# Patient Record
Sex: Male | Born: 1960
Health system: Southern US, Community
[De-identification: ages and names within clinical notes are randomized; demographics above are authoritative.]

## PROBLEM LIST (undated history)

## (undated) DIAGNOSIS — R972 Elevated prostate specific antigen [PSA]: Secondary | ICD-10-CM

## (undated) DIAGNOSIS — M519 Unspecified thoracic, thoracolumbar and lumbosacral intervertebral disc disorder: Secondary | ICD-10-CM

## (undated) DIAGNOSIS — E785 Hyperlipidemia, unspecified: Secondary | ICD-10-CM

## (undated) DIAGNOSIS — K8020 Calculus of gallbladder without cholecystitis without obstruction: Secondary | ICD-10-CM

## (undated) DIAGNOSIS — J309 Allergic rhinitis, unspecified: Secondary | ICD-10-CM

## (undated) DIAGNOSIS — T7840XA Allergy, unspecified, initial encounter: Secondary | ICD-10-CM

## (undated) DIAGNOSIS — I10 Essential (primary) hypertension: Secondary | ICD-10-CM

## (undated) DIAGNOSIS — E119 Type 2 diabetes mellitus without complications: Secondary | ICD-10-CM

## (undated) HISTORY — DX: Type 2 diabetes mellitus without complications: E11.9

## (undated) HISTORY — DX: Allergy, unspecified, initial encounter: T78.40XA

## (undated) HISTORY — DX: Unspecified thoracic, thoracolumbar and lumbosacral intervertebral disc disorder: M51.9

## (undated) HISTORY — DX: Essential (primary) hypertension: I10

## (undated) HISTORY — PX: NASAL SEPTUM SURGERY: SHX37

## (undated) HISTORY — DX: Calculus of gallbladder without cholecystitis without obstruction: K80.20

## (undated) HISTORY — DX: Allergic rhinitis, unspecified: J30.9

## (undated) HISTORY — DX: Elevated prostate specific antigen (PSA): R97.20

## (undated) HISTORY — DX: Hyperlipidemia, unspecified: E78.5

---

## 1987-10-21 HISTORY — PX: BACK SURGERY: SHX140

## 2002-11-23 ENCOUNTER — Encounter: Admission: RE | Admit: 2002-11-23 | Discharge: 2003-02-21 | Payer: Self-pay | Admitting: Internal Medicine

## 2003-07-25 ENCOUNTER — Encounter: Admission: RE | Admit: 2003-07-25 | Discharge: 2003-10-23 | Payer: Self-pay | Admitting: Internal Medicine

## 2004-11-27 ENCOUNTER — Ambulatory Visit: Payer: Self-pay | Admitting: Internal Medicine

## 2005-03-27 ENCOUNTER — Ambulatory Visit: Payer: Self-pay | Admitting: Internal Medicine

## 2005-06-20 ENCOUNTER — Ambulatory Visit: Payer: Self-pay | Admitting: Internal Medicine

## 2005-06-27 ENCOUNTER — Ambulatory Visit: Payer: Self-pay | Admitting: Internal Medicine

## 2005-10-02 ENCOUNTER — Ambulatory Visit: Payer: Self-pay | Admitting: Internal Medicine

## 2005-12-25 ENCOUNTER — Ambulatory Visit: Payer: Self-pay | Admitting: Internal Medicine

## 2005-12-31 ENCOUNTER — Ambulatory Visit: Payer: Self-pay | Admitting: Internal Medicine

## 2006-05-04 ENCOUNTER — Ambulatory Visit: Payer: Self-pay | Admitting: Internal Medicine

## 2006-05-26 ENCOUNTER — Ambulatory Visit: Payer: Self-pay | Admitting: Internal Medicine

## 2006-06-25 ENCOUNTER — Ambulatory Visit: Payer: Self-pay | Admitting: Internal Medicine

## 2006-09-01 ENCOUNTER — Ambulatory Visit: Payer: Self-pay | Admitting: Internal Medicine

## 2006-11-26 ENCOUNTER — Ambulatory Visit: Payer: Self-pay | Admitting: Internal Medicine

## 2006-11-26 LAB — CONVERTED CEMR LAB
Alkaline Phosphatase: 49 units/L (ref 39–117)
BUN: 11 mg/dL (ref 6–23)
Bilirubin, Direct: 0.1 mg/dL (ref 0.0–0.3)
CO2: 30 meq/L (ref 19–32)
Calcium: 9.1 mg/dL (ref 8.4–10.5)
GFR calc Af Amer: 118 mL/min
GFR calc non Af Amer: 97 mL/min
Glucose, Bld: 121 mg/dL — ABNORMAL HIGH (ref 70–99)
Potassium: 4 meq/L (ref 3.5–5.1)
Total Protein: 6.7 g/dL (ref 6.0–8.3)

## 2006-12-04 ENCOUNTER — Ambulatory Visit: Payer: Self-pay | Admitting: Internal Medicine

## 2007-04-02 ENCOUNTER — Ambulatory Visit: Payer: Self-pay | Admitting: Internal Medicine

## 2007-04-02 LAB — CONVERTED CEMR LAB
CO2: 29 meq/L (ref 19–32)
Cholesterol: 205 mg/dL (ref 0–200)
Creatinine, Ser: 0.9 mg/dL (ref 0.4–1.5)
GFR calc Af Amer: 117 mL/min
Glucose, Bld: 134 mg/dL — ABNORMAL HIGH (ref 70–99)
HDL: 35.6 mg/dL — ABNORMAL LOW (ref >39.0)
Potassium: 4.3 meq/L (ref 3.5–5.1)
Sodium: 144 meq/L (ref 135–145)
Triglycerides: 81 mg/dL (ref 0–149)

## 2007-04-09 ENCOUNTER — Ambulatory Visit: Payer: Self-pay | Admitting: Internal Medicine

## 2007-05-24 ENCOUNTER — Encounter: Payer: Self-pay | Admitting: Internal Medicine

## 2007-05-24 DIAGNOSIS — E119 Type 2 diabetes mellitus without complications: Secondary | ICD-10-CM

## 2007-05-24 HISTORY — DX: Type 2 diabetes mellitus without complications: E11.9

## 2007-08-09 ENCOUNTER — Ambulatory Visit: Payer: Self-pay | Admitting: Internal Medicine

## 2007-08-09 LAB — CONVERTED CEMR LAB
Albumin: 3.8 g/dL (ref 3.5–5.2)
Alkaline Phosphatase: 57 units/L (ref 39–117)
BUN: 6 mg/dL (ref 6–23)
CO2: 32 meq/L (ref 19–32)
GFR calc Af Amer: 104 mL/min
GFR calc non Af Amer: 86 mL/min
LDL Cholesterol: 122 mg/dL — ABNORMAL HIGH (ref 0–99)
Potassium: 4.2 meq/L (ref 3.5–5.1)
Total CHOL/HDL Ratio: 5.2
Total Protein: 6.8 g/dL (ref 6.0–8.3)
Triglycerides: 135 mg/dL (ref 0–149)
VLDL: 27 mg/dL (ref 0–40)

## 2007-09-01 ENCOUNTER — Telehealth: Payer: Self-pay | Admitting: Internal Medicine

## 2007-09-11 ENCOUNTER — Telehealth: Payer: Self-pay | Admitting: Internal Medicine

## 2007-09-11 ENCOUNTER — Ambulatory Visit: Payer: Self-pay | Admitting: Internal Medicine

## 2007-09-11 DIAGNOSIS — M549 Dorsalgia, unspecified: Secondary | ICD-10-CM

## 2007-09-13 ENCOUNTER — Telehealth: Payer: Self-pay | Admitting: Internal Medicine

## 2007-10-06 ENCOUNTER — Ambulatory Visit: Payer: Self-pay | Admitting: Internal Medicine

## 2007-10-06 DIAGNOSIS — E785 Hyperlipidemia, unspecified: Secondary | ICD-10-CM

## 2007-10-06 HISTORY — DX: Hyperlipidemia, unspecified: E78.5

## 2007-10-06 LAB — CONVERTED CEMR LAB: Blood Glucose, Fingerstick: 178

## 2008-01-31 ENCOUNTER — Ambulatory Visit: Payer: Self-pay | Admitting: Internal Medicine

## 2008-01-31 LAB — CONVERTED CEMR LAB
BUN: 9 mg/dL (ref 6–23)
Cholesterol: 187 mg/dL (ref 0–200)
GFR calc Af Amer: 117 mL/min
Glucose, Bld: 176 mg/dL — ABNORMAL HIGH (ref 70–99)
Potassium: 4.1 meq/L (ref 3.5–5.1)
Triglycerides: 87 mg/dL (ref 0–149)
VLDL: 17 mg/dL (ref 0–40)

## 2008-02-03 ENCOUNTER — Ambulatory Visit: Payer: Self-pay | Admitting: Internal Medicine

## 2008-02-06 DIAGNOSIS — I10 Essential (primary) hypertension: Secondary | ICD-10-CM

## 2008-02-06 HISTORY — DX: Essential (primary) hypertension: I10

## 2008-03-24 ENCOUNTER — Telehealth (INDEPENDENT_AMBULATORY_CARE_PROVIDER_SITE_OTHER): Payer: Self-pay | Admitting: *Deleted

## 2008-05-08 ENCOUNTER — Encounter: Payer: Self-pay | Admitting: Internal Medicine

## 2008-07-24 ENCOUNTER — Ambulatory Visit: Payer: Self-pay | Admitting: Internal Medicine

## 2008-07-24 LAB — CONVERTED CEMR LAB
BUN: 7 mg/dL (ref 6–23)
Chloride: 105 meq/L (ref 96–112)
GFR calc Af Amer: 103 mL/min
Glucose, Bld: 117 mg/dL — ABNORMAL HIGH (ref 70–99)
Potassium: 4 meq/L (ref 3.5–5.1)

## 2008-08-17 ENCOUNTER — Ambulatory Visit: Payer: Self-pay | Admitting: Internal Medicine

## 2009-05-10 ENCOUNTER — Ambulatory Visit: Payer: Self-pay | Admitting: Internal Medicine

## 2009-05-10 DIAGNOSIS — R634 Abnormal weight loss: Secondary | ICD-10-CM

## 2009-05-11 ENCOUNTER — Encounter (INDEPENDENT_AMBULATORY_CARE_PROVIDER_SITE_OTHER): Payer: Self-pay | Admitting: *Deleted

## 2009-05-11 ENCOUNTER — Telehealth: Payer: Self-pay | Admitting: Internal Medicine

## 2009-05-11 ENCOUNTER — Encounter: Payer: Self-pay | Admitting: Internal Medicine

## 2009-05-11 DIAGNOSIS — R972 Elevated prostate specific antigen [PSA]: Secondary | ICD-10-CM

## 2009-05-11 HISTORY — DX: Elevated prostate specific antigen (PSA): R97.20

## 2009-05-18 ENCOUNTER — Telehealth: Payer: Self-pay | Admitting: Internal Medicine

## 2009-06-11 ENCOUNTER — Encounter: Payer: Self-pay | Admitting: Internal Medicine

## 2009-09-12 ENCOUNTER — Telehealth: Payer: Self-pay | Admitting: Internal Medicine

## 2010-03-04 ENCOUNTER — Telehealth: Payer: Self-pay | Admitting: Internal Medicine

## 2010-06-25 ENCOUNTER — Encounter: Payer: Self-pay | Admitting: Internal Medicine

## 2010-11-17 LAB — CONVERTED CEMR LAB
ALT: 23 units/L (ref 0–53)
Basophils Relative: 0.7 % (ref 0.0–3.0)
Bilirubin, Direct: 0.1 mg/dL (ref 0.0–0.3)
Chloride: 106 meq/L (ref 96–112)
Creatinine,U: 321.3 mg/dL
Eosinophils Relative: 1.5 % (ref 0.0–5.0)
HCT: 41.6 % (ref 39.0–52.0)
HDL goal, serum: 40 mg/dL
Hgb A1c MFr Bld: 6.5 % (ref 4.6–6.5)
LDL Goal: 100 mg/dL
Lymphs Abs: 1.2 10*3/uL (ref 0.7–4.0)
MCV: 84.7 fL (ref 78.0–100.0)
Monocytes Absolute: 0.5 10*3/uL (ref 0.1–1.0)
Nitrite: NEGATIVE
Potassium: 4.2 meq/L (ref 3.5–5.1)
RBC: 4.91 M/uL (ref 4.22–5.81)
Specific Gravity, Urine: >=1.03 (ref 1.000–1.030)
TSH: 1.07 microintl units/mL (ref 0.35–5.50)
Total Protein: 6.9 g/dL (ref 6.0–8.3)
Urine Glucose: NEGATIVE mg/dL
Urobilinogen, UA: 1 (ref 0.0–1.0)
WBC: 3.3 10*3/uL — ABNORMAL LOW (ref 4.5–10.5)

## 2010-11-21 NOTE — Letter (Signed)
Summary: [facility]   Imported By: Sherian Rein 07/02/2010 09:32:03  _____________________________________________________________________  External Attachment:    Type:   Image     Comment:   External Document

## 2010-11-21 NOTE — Progress Notes (Signed)
Summary: REFERRAL  Phone Note Call from Patient   Caller: Wife - [phone] OR [phone] Summary of Call: Pt works in [place] and has seen a Careers adviser for hernia surgery. Pt wants surgery here in [place].  Initial call taken by: Lamar Sprinkles, CMA,  Mar 04, 2010 11:22 AM  Follow-up for Phone Call        Advised wife to contact pt's MD in [facility] or surgeon and request referral to [facility] [phone]). Advised to call office back if they are unable to refer and will will request referral from Dr Plotnikov.  Follow-up by: Lamar Sprinkles, CMA,  Mar 04, 2010 11:27 AM

## 2011-02-18 HISTORY — PX: HERNIA REPAIR: SHX51

## 2011-03-13 ENCOUNTER — Ambulatory Visit (HOSPITAL_COMMUNITY)
Admission: RE | Admit: 2011-03-13 | Discharge: 2011-03-13 | Disposition: A | Discharge status: Home / Self Care | Payer: BC Managed Care – PPO | Source: Ambulatory Visit | Attending: Surgery | Admitting: Surgery

## 2011-03-13 ENCOUNTER — Encounter (HOSPITAL_COMMUNITY)
Admission: RE | Admit: 2011-03-13 | Discharge: 2011-03-13 | Disposition: A | Discharge status: Home / Self Care | Payer: BC Managed Care – PPO | Source: Ambulatory Visit | Attending: Surgery | Admitting: Surgery

## 2011-03-13 ENCOUNTER — Other Ambulatory Visit (INDEPENDENT_AMBULATORY_CARE_PROVIDER_SITE_OTHER): Payer: Self-pay | Admitting: Surgery

## 2011-03-13 DIAGNOSIS — K4090 Unilateral inguinal hernia, without obstruction or gangrene, not specified as recurrent: Secondary | ICD-10-CM

## 2011-03-13 DIAGNOSIS — Z01818 Encounter for other preprocedural examination: Secondary | ICD-10-CM | POA: Insufficient documentation

## 2011-03-13 DIAGNOSIS — Z01812 Encounter for preprocedural laboratory examination: Secondary | ICD-10-CM | POA: Insufficient documentation

## 2011-03-13 LAB — CBC
HCT: 41.9 % (ref 39.0–52.0)
MCV: 83.1 fL (ref 78.0–100.0)
RBC: 5.04 MIL/uL (ref 4.22–5.81)
RDW: 13.8 % (ref 11.5–15.5)
WBC: 3.5 10*3/uL — ABNORMAL LOW (ref 4.0–10.5)

## 2011-03-13 LAB — BASIC METABOLIC PANEL
BUN: 13 mg/dL (ref 6–23)
CO2: 29 mEq/L (ref 19–32)
Chloride: 103 mEq/L (ref 96–112)
Glucose, Bld: 98 mg/dL (ref 70–99)
Potassium: 4.1 mEq/L (ref 3.5–5.1)

## 2011-03-14 ENCOUNTER — Ambulatory Visit (HOSPITAL_COMMUNITY)
Admission: RE | Admit: 2011-03-14 | Discharge: 2011-03-14 | Disposition: A | Discharge status: Home / Self Care | Payer: BC Managed Care – PPO | Source: Ambulatory Visit | Attending: Surgery | Admitting: Surgery

## 2011-03-14 DIAGNOSIS — I10 Essential (primary) hypertension: Secondary | ICD-10-CM | POA: Insufficient documentation

## 2011-03-14 DIAGNOSIS — K4090 Unilateral inguinal hernia, without obstruction or gangrene, not specified as recurrent: Secondary | ICD-10-CM | POA: Insufficient documentation

## 2011-03-14 DIAGNOSIS — Z01818 Encounter for other preprocedural examination: Secondary | ICD-10-CM | POA: Insufficient documentation

## 2011-03-14 DIAGNOSIS — Z01810 Encounter for preprocedural cardiovascular examination: Secondary | ICD-10-CM | POA: Insufficient documentation

## 2011-03-14 DIAGNOSIS — E119 Type 2 diabetes mellitus without complications: Secondary | ICD-10-CM | POA: Insufficient documentation

## 2011-03-14 DIAGNOSIS — Z01812 Encounter for preprocedural laboratory examination: Secondary | ICD-10-CM | POA: Insufficient documentation

## 2011-03-14 LAB — GLUCOSE, CAPILLARY: Glucose-Capillary: 125 mg/dL — ABNORMAL HIGH (ref 70–99)

## 2011-04-03 ENCOUNTER — Encounter (INDEPENDENT_AMBULATORY_CARE_PROVIDER_SITE_OTHER): Payer: Self-pay | Admitting: Surgery

## 2011-04-03 NOTE — Op Note (Signed)
  NAME:  JERMOND, BURKEMPER NO.:  [id number]  MEDICAL RECORD NO.:  [id number]           PATIENT TYPE:  O  LOCATION:  [facility]                         FACILITY:  [facility]  PHYSICIAN:  Abigail Miyamoto, M.D. DATE OF BIRTH:  07-Mar-1961  DATE OF PROCEDURE:  03/14/2011 DATE OF DISCHARGE:  03/14/2011                              OPERATIVE REPORT   PREOPERATIVE DIAGNOSIS:  Left inguinal hernia.  POSTOPERATIVE DIAGNOSIS:  Left inguinal hernia.  PROCEDURE:  Left inguinal repair with mesh.  SURGEON:  Abigail Miyamoto, MD  ANESTHESIA:  General and 0.5% Marcaine and bupivacaine.  ESTIMATED BLOOD LOSS:  Minimal.  FINDINGS:  The patient was found to have a very small indirect and a moderate-sized direct inguinal hernia.  It was repaired with a piece of Parietex ProGrip Prolene mesh.  PROCEDURE IN DETAIL:  The patient was brought to the operating room and identified as Howard [name].  He was placed supine on the operating table and general anesthesia was induced.  His abdomen was then prepped and draped in the usual sterile fashion.  I anesthetized the skin with 0.5% Marcaine.  I then made an incision with a #15 blade in the patient's right groin.  I took this down through the Scarpa fascia with electrocautery.  The external oblique fascia was then identified and opened, then further through the external and internal rings with cautery.  The testicular and cord structures were then identified and controlled with Penrose drain.  The patient had a very small indirect hernia sac which I reduced from the cord structures.  I tied out the sac at the base with a 3-0 silk suture and excised the redundant sac.  The patient's palpable hernia was from a direct hernia defect.  This was easily reduced.  I then brought a piece of Parietex ProGrip mesh onto the field.  I placed it around the cord structures and laid in the inguinal floor appropriately.  I placed a single 2-0 Vicryl suture  at the pubis and placed one more medial laterally.  Good coverage of the inguinal floor appeared to be achieved.  I then closed the external oblique fascia over top of this with a running 2-0 Vicryl suture.  I anesthetized the skin further with bupivacaine and performed an ilioinguinal nerve block with the bupivacaine as well.  I then closed the Scarpa fascia with 3-0 Vicryl sutures and closed the skin with running 4-0 Monocryl.  Steri-Strips, Telfa, and Tegaderm were then applied.  The patient tolerated the procedure well.  All counts were correct at the end of the procedure.  The patient was then extubated in the operating room and taken in stable condition to the recovery room.     Abigail Miyamoto, M.D.     DB/MEDQ  D:  03/14/2011  T:  03/14/2011  Job:  [id number]  Electronically Signed by Abigail Miyamoto M.D. on 04/03/2011 07:05:04 AM

## 2012-01-15 ENCOUNTER — Other Ambulatory Visit (INDEPENDENT_AMBULATORY_CARE_PROVIDER_SITE_OTHER): Payer: 59

## 2012-01-15 ENCOUNTER — Ambulatory Visit (INDEPENDENT_AMBULATORY_CARE_PROVIDER_SITE_OTHER): Payer: 59 | Admitting: Internal Medicine

## 2012-01-15 ENCOUNTER — Encounter: Payer: Self-pay | Admitting: Internal Medicine

## 2012-01-15 VITALS — BP 120/84 | HR 64 | Temp 97.5°F | Ht 74.0 in | Wt 253.4 lb

## 2012-01-15 DIAGNOSIS — Z0000 Encounter for general adult medical examination without abnormal findings: Secondary | ICD-10-CM

## 2012-01-15 DIAGNOSIS — E785 Hyperlipidemia, unspecified: Secondary | ICD-10-CM

## 2012-01-15 DIAGNOSIS — E119 Type 2 diabetes mellitus without complications: Secondary | ICD-10-CM

## 2012-01-15 DIAGNOSIS — M519 Unspecified thoracic, thoracolumbar and lumbosacral intervertebral disc disorder: Secondary | ICD-10-CM | POA: Insufficient documentation

## 2012-01-15 DIAGNOSIS — Z0001 Encounter for general adult medical examination with abnormal findings: Secondary | ICD-10-CM | POA: Insufficient documentation

## 2012-01-15 DIAGNOSIS — I10 Essential (primary) hypertension: Secondary | ICD-10-CM

## 2012-01-15 DIAGNOSIS — J309 Allergic rhinitis, unspecified: Secondary | ICD-10-CM

## 2012-01-15 HISTORY — DX: Allergic rhinitis, unspecified: J30.9

## 2012-01-15 LAB — BASIC METABOLIC PANEL
BUN: 16 mg/dL (ref 6–23)
CO2: 26 mEq/L (ref 19–32)
Calcium: 9.5 mg/dL (ref 8.4–10.5)
GFR: 151.13 mL/min (ref >60.00)
Glucose, Bld: 114 mg/dL — ABNORMAL HIGH (ref 70–99)

## 2012-01-15 LAB — TSH: TSH: 1.44 u[IU]/mL (ref 0.35–5.50)

## 2012-01-15 LAB — CBC WITH DIFFERENTIAL/PLATELET
Basophils Absolute: 0 10*3/uL (ref 0.0–0.1)
Eosinophils Relative: 2.1 % (ref 0.0–5.0)
HCT: 44 % (ref 39.0–52.0)
Lymphocytes Relative: 38.8 % (ref 12.0–46.0)
Monocytes Relative: 13.6 % — ABNORMAL HIGH (ref 3.0–12.0)
Neutrophils Relative %: 44.2 % (ref 43.0–77.0)
Platelets: 176 10*3/uL (ref 150.0–400.0)
WBC: 3 10*3/uL — ABNORMAL LOW (ref 4.5–10.5)

## 2012-01-15 LAB — URINALYSIS, ROUTINE W REFLEX MICROSCOPIC
Bilirubin Urine: NEGATIVE
Nitrite: NEGATIVE
Specific Gravity, Urine: >=1.03 (ref 1.000–1.030)
Total Protein, Urine: NEGATIVE
pH: 6 (ref 5.0–8.0)

## 2012-01-15 LAB — HEPATIC FUNCTION PANEL
ALT: 23 U/L (ref 0–53)
AST: 24 U/L (ref 0–37)
Alkaline Phosphatase: 63 U/L (ref 39–117)
Bilirubin, Direct: 0.1 mg/dL (ref 0.0–0.3)
Total Bilirubin: 0.5 mg/dL (ref 0.3–1.2)

## 2012-01-15 LAB — MICROALBUMIN / CREATININE URINE RATIO
Microalb Creat Ratio: 0.3 mg/g (ref 0.0–30.0)
Microalb, Ur: 0.9 mg/dL (ref 0.0–1.9)

## 2012-01-15 LAB — HEMOGLOBIN A1C: Hgb A1c MFr Bld: 6.8 % — ABNORMAL HIGH (ref 4.6–6.5)

## 2012-01-15 LAB — LIPID PANEL: Total CHOL/HDL Ratio: 4

## 2012-01-15 MED ORDER — LANCETS MISC: 1.0000 "application " | Freq: Every day | Status: DC

## 2012-01-15 MED ORDER — LISINOPRIL 5 MG PO TABS: 5.0000 mg | ORAL_TABLET | Freq: Every day | ORAL | Status: DC

## 2012-01-15 MED ORDER — GLUCOSE BLOOD VI STRP: ORAL_STRIP | Status: DC

## 2012-01-15 MED ORDER — FEXOFENADINE HCL 180 MG PO TABS: 180.0000 mg | ORAL_TABLET | Freq: Every day | ORAL | Status: DC

## 2012-01-15 MED ORDER — METFORMIN HCL ER 500 MG PO TB24: 1000.0000 mg | ORAL_TABLET | Freq: Every day | ORAL | Status: DC

## 2012-01-15 MED ORDER — ASPIRIN 81 MG PO TBEC: 81.0000 mg | DELAYED_RELEASE_TABLET | Freq: Every day | ORAL | Status: AC

## 2012-01-15 MED ORDER — PRAVASTATIN SODIUM 20 MG PO TABS: 20.0000 mg | ORAL_TABLET | Freq: Every day | ORAL | Status: DC

## 2012-01-15 NOTE — Assessment & Plan Note (Signed)
Ok to take the whole metformin in the AM to help compliacne, to check labs, gave glucometer and supplies , for cont'd diet, exercise, wt loss

## 2012-01-15 NOTE — Assessment & Plan Note (Signed)

## 2012-01-15 NOTE — Patient Instructions (Addendum)
Take all new medications as prescribed, including the Aspirin 81 mg - 1 per day - COATED only Continue all other medications as before Please call if you need the flonase nasal spray prescription for nasal congestion if the allegra is not good enough You are given the glucometer today, and the prescription for the supplies Please check your sugar once daily Please go to LAB in the Basement for the blood and/or urine tests to be done today You will be contacted by phone if any changes need to be made immediately.  Otherwise, you will receive a letter about your results with an explanation. You are otherwise up to date with prevention Please return in 6 mo with Lab testing done 3-5 days before

## 2012-01-15 NOTE — Assessment & Plan Note (Signed)
Ok to re-start the pravachol 20

## 2012-01-15 NOTE — Progress Notes (Signed)
Subjective:    Patient ID: Howard [name], male    DOB: 1961/03/16, 51 y.o.   MRN: [id number]  HPI  Here for wellness and to re-stablish after working in [place] for about 3 yrs;;  Overall doing ok;  Pt denies CP, worsening SOB, DOE, wheezing, orthopnea, PND, worsening LE edema, palpitations, dizziness or syncope.  Pt denies neurological change such as new Headache, facial or extremity weakness.  Pt denies polydipsia, polyuria, or low sugar symptoms. Pt states overall good compliance with treatment and medications, good tolerability, and trying to follow lower cholesterol diet.  Pt denies worsening depressive symptoms, suicidal ideation or panic. No fever, wt loss, night sweats, loss of appetite, or other constitutional symptoms.  Pt states good ability with ADL's, low fall risk, home safety reviewed and adequate, no significant changes in hearing or vision, and occasionally active with exercise. Last a1c about 6.7 in 2012.   Was on metformin Er 500 bid, but often missed the second dose per day.  Also was on lisinopril 10 mg and pravastatin 20 at night but ran out of these meds dec 2012.   Not taking ASA.  Does have signficant nasal allergy symptoms since moving back to [place], works [employer] representative.  Past Medical History  Diagnosis Date  . Diabetes mellitus   . PSA, INCREASED 05/11/2009    Qualifier: Diagnosis of  By: Yetta Barre MD, Bernadene Bell.   . HYPERTENSION 02/06/2008    Qualifier: Diagnosis of  By: Posey Rea MD, Georgina Quint   . HYPERLIPIDEMIA 10/06/2007    Qualifier: Diagnosis of  By: Posey Rea MD, Georgina Quint   . DIABETES MELLITUS, TYPE II 05/24/2007    Qualifier: Diagnosis of  By: Dance CMA (AAMA), Kim    . Lumbar disc disease     s/p surgury approx 1992   Past Surgical History  Procedure Date  . Back surgery     Lumbar/Ruptured Disk  . Hernia repair may 2012    left inguinal    reports that he has never smoked. He has never used smokeless tobacco. He reports that he drinks alcohol. He reports that he  does not use illicit drugs. family history includes Cancer in his mother; Diabetes in his father and mother; and Hypertension in his father and mother. No Known Allergies Current Outpatient Prescriptions on File Prior to Visit  Medication Sig Dispense Refill  . metFORMIN (GLUMETZA) 1000 MG (MOD) 24 hr tablet Take 1,000 mg by mouth 2 (two) times daily with a meal.         Review of Systems Review of Systems  Constitutional: Negative for diaphoresis, activity change, appetite change and unexpected weight change.  HENT: Negative for hearing loss, ear pain, facial swelling, mouth sores and neck stiffness.   Eyes: Negative for pain, redness and visual disturbance.  Respiratory: Negative for shortness of breath and wheezing.   Cardiovascular: Negative for chest pain and palpitations.  Gastrointestinal: Negative for diarrhea, blood in stool, abdominal distention and rectal pain.  Genitourinary: Negative for hematuria, flank pain and decreased urine volume.  Musculoskeletal: Negative for myalgias and joint swelling.  Skin: Negative for color change and wound.  Neurological: Negative for syncope and numbness.  Hematological: Negative for adenopathy.  Psychiatric/Behavioral: Negative for hallucinations, self-injury, decreased concentration and agitation.      Objective:   Physical Exam BP 120/84  Pulse 64  Temp(Src) 97.5 F (36.4 C) (Oral)  Ht 6\' 2"  (1.88 m)  Wt 253 lb 6 oz (114.93 kg)  BMI 32.53 kg/m2  SpO2  96% Physical Exam  VS noted Constitutional: Pt is oriented to person, place, and time. Appears well-developed and well-nourished.  HENT:  Head: Normocephalic and atraumatic.  Right Ear: External ear normal.  Left Ear: External ear normal.  Nose: Nose normal.  Mouth/Throat: Oropharynx is clear and moist.  Bilat tm's mild erythema.  Sinus nontender.  Pharynx mild erythema Eyes: Conjunctivae and EOM are normal. Pupils are equal, round, and reactive to light.  Neck: Normal range of  motion. Neck supple. No JVD present. No tracheal deviation present.  Cardiovascular: Normal rate, regular rhythm, normal heart sounds and intact distal pulses.   Pulmonary/Chest: Effort normal and breath sounds normal.  Abdominal: Soft. Bowel sounds are normal. There is no tenderness.  Musculoskeletal: Normal range of motion. Exhibits no edema.  Lymphadenopathy:  Has no cervical adenopathy.  Neurological: Pt is alert and oriented to person, place, and time. Pt has normal reflexes. No cranial nerve deficit.  Skin: Skin is warm and dry. No rash noted.  Psychiatric:  Has  normal mood and affect. Behavior is normal.     Assessment & Plan:

## 2012-01-15 NOTE — Assessment & Plan Note (Signed)
Ok for [employer] prn, consider flonase if congestion worse

## 2012-01-15 NOTE — Assessment & Plan Note (Signed)
Ok for low dose ACE;  Lisinopril 5

## 2012-02-02 ENCOUNTER — Other Ambulatory Visit: Payer: Self-pay

## 2012-02-02 DIAGNOSIS — E119 Type 2 diabetes mellitus without complications: Secondary | ICD-10-CM

## 2012-02-02 MED ORDER — GLUCOSE BLOOD VI STRP: ORAL_STRIP | Status: DC

## 2012-04-02 ENCOUNTER — Telehealth: Payer: Self-pay | Admitting: Internal Medicine

## 2012-04-02 NOTE — Telephone Encounter (Signed)
Caller: Trevaughn/Patient; PCP: Oliver Barre; CB#: [phone]; ; Call regarding (R) Knee Pain; onset ~ 1 week of swelling, denies injury, walks with a limp, unable to straighten his knee, using Ibuprofen for the pain, calling for appt. Smptoms reviewed Knee Non-Injury,  Disp: See within 4 hrs: due to marked swelling,   no appt available in [facility], please call pt re: appt. Pt asks is he can have referral to ortho. Please call.

## 2012-04-07 NOTE — Telephone Encounter (Signed)
Can see this afternoon or in the am per pt preference

## 2012-04-07 NOTE — Telephone Encounter (Signed)
Called the patient left message to call back 

## 2012-04-07 NOTE — Telephone Encounter (Signed)
Called left message to call back 

## 2012-04-07 NOTE — Telephone Encounter (Signed)
Note was routed to several different users not to PCP or CMA.

## 2012-04-07 NOTE — Telephone Encounter (Signed)
Patient called back informed ok to be seen.  Patient scheduled with Dr. Jonny Ruiz on 04/08/12.

## 2012-04-08 ENCOUNTER — Ambulatory Visit (INDEPENDENT_AMBULATORY_CARE_PROVIDER_SITE_OTHER): Payer: 59 | Admitting: Internal Medicine

## 2012-04-08 ENCOUNTER — Encounter: Payer: Self-pay | Admitting: Internal Medicine

## 2012-04-08 VITALS — BP 122/80 | HR 78 | Temp 97.3°F | Ht 74.0 in | Wt 262.0 lb

## 2012-04-08 DIAGNOSIS — M25561 Pain in right knee: Secondary | ICD-10-CM | POA: Insufficient documentation

## 2012-04-08 DIAGNOSIS — E119 Type 2 diabetes mellitus without complications: Secondary | ICD-10-CM

## 2012-04-08 DIAGNOSIS — I10 Essential (primary) hypertension: Secondary | ICD-10-CM

## 2012-04-08 DIAGNOSIS — M25569 Pain in unspecified knee: Secondary | ICD-10-CM

## 2012-04-08 MED ORDER — TRAMADOL HCL 50 MG PO TABS: 50.0000 mg | ORAL_TABLET | Freq: Four times a day (QID) | ORAL | Status: AC | PRN

## 2012-04-08 MED ORDER — NAPROXEN 500 MG PO TABS: 500.0000 mg | ORAL_TABLET | Freq: Two times a day (BID) | ORAL | Status: DC

## 2012-04-08 NOTE — Assessment & Plan Note (Signed)
stable overall by hx and exam, most recent data reviewed with pt, and pt to continue medical treatment as before Lab Results  Component Value Date   HGBA1C 6.8* 01/15/2012

## 2012-04-08 NOTE — Patient Instructions (Addendum)
Take all new medications as prescribed Continue all other medications as before You will be contacted regarding the referral for: orthopedic 

## 2012-04-08 NOTE — Progress Notes (Signed)
Subjective:    Patient ID: Howard [name], male    DOB: Oct 02, 1961, 51 y.o.   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  Pt denies polydipsia, polyuria, Pt denies chest pain, increased sob or doe, wheezing, orthopnea, PND, increased LE swelling, palpitations, dizziness or syncope.  Pt denies new neurological symptoms such as new headache, or facial or extremity weakness or numbness     Past Medical History  Diagnosis Date  . Diabetes mellitus   . PSA, INCREASED 05/11/2009    Qualifier: Diagnosis of  By: Yetta Barre MD, Bernadene Bell.   . HYPERTENSION 02/06/2008    Qualifier: Diagnosis of  By: Posey Rea MD, Georgina Quint   . HYPERLIPIDEMIA 10/06/2007    Qualifier: Diagnosis of  By: Posey Rea MD, Georgina Quint   . DIABETES MELLITUS, TYPE II 05/24/2007    Qualifier: Diagnosis of  By: Dance CMA (AAMA), Kim    . Lumbar disc disease     s/p surgury approx 1992  . Allergic rhinitis, cause unspecified 01/15/2012   Past Surgical History  Procedure Date  . Back surgery     Lumbar/Ruptured Disk  . Hernia repair may 2012    left inguinal    reports that he has never smoked. He has never used smokeless tobacco. He reports that he drinks alcohol. He reports that he does not use illicit drugs. family history includes Cancer in his mother; Diabetes in his father and mother; and Hypertension in his father and mother. No Known Allergies Current Outpatient Prescriptions on File Prior to Visit  Medication Sig Dispense Refill  . aspirin 81 MG EC tablet Take 1 tablet (81 mg total) by mouth daily. Swallow whole.  30 tablet  12  . fexofenadine (ALLEGRA) 180  MG tablet Take 1 tablet (180 mg total) by mouth daily.  90 tablet  3  . glucose blood (ONE TOUCH ULTRA TEST) test strip Use as instructed once daily. Diagnosis 250.0  100 each  12  . Lancets MISC 1 application by Does not apply route daily.  100 each  12  . lisinopril (PRINIVIL,ZESTRIL) 5 MG tablet Take 1 tablet (5 mg total) by mouth daily.  90 tablet  3  . metFORMIN (GLUCOPHAGE-XR) 500 MG 24 hr tablet Take 2 tablets (1,000 mg total) by mouth daily with breakfast.  180 tablet  3  . pravastatin (PRAVACHOL) 20 MG tablet Take 1 tablet (20 mg total) by mouth daily.  90 tablet  3   Review of Systems Review of Systems  Constitutional: Negative for diaphoresis and unexpected weight change.   Eyes: Negative for photophobia and visual disturbance.  Respiratory: Negative for choking and stridor.   Gastrointestinal: Negative for vomiting and blood in stool.  Genitourinary: Negative for hematuria and decreased urine volume.  Skin: Negative for color change and wound.  Neurological: Negative for tremors and numbness.  Psychiatric/Behavioral: Negative for decreased concentration. The patient is not hyperactive.      Objective:   Physical Exam BP 122/80  Pulse 78  Temp 97.3 F (36.3 C) (Oral)  Ht 6\' 2"  (1.88 m)  Wt 262 lb (  118.842 kg)  BMI 33.64 kg/m2  SpO2 96% Physical Exam  VS noted Constitutional: Pt appears well-developed and well-nourished.  HENT: Head: Normocephalic.  Right Ear: External ear normal.  Left Ear: External ear normal.  Eyes: Conjunctivae and EOM are normal. Pupils are equal, round, and reactive to light.  Neck: Normal range of motion. Neck supple.  Cardiovascular: Normal rate and regular rhythm.   Pulmonary/Chest: Effort normal and breath sounds normal.  Right knee with medial bony deg change and mild swelling , nontender to palpate, FROM, small crepitus noted Neurological: Pt is alert. LE motor/gait intact Skin: Skin is warm. No erythema. No rash Psychiatric: Pt behavior  is normal. Thought content normal. Not nervous    Assessment & Plan:

## 2012-04-08 NOTE — Assessment & Plan Note (Signed)
C/w prob OA flare, d/w pt  - for nsaid and pain control, but liekly to recur given his job, for ortho referral as well

## 2012-04-08 NOTE — Assessment & Plan Note (Signed)
stable overall by hx and exam, most recent data reviewed with pt, and pt to continue medical treatment as before BP Readings from Last 3 Encounters:  04/08/12 122/80  01/15/12 120/84  05/10/09 140/82

## 2012-07-19 ENCOUNTER — Encounter: Payer: Self-pay | Admitting: Internal Medicine

## 2012-07-19 ENCOUNTER — Other Ambulatory Visit (INDEPENDENT_AMBULATORY_CARE_PROVIDER_SITE_OTHER): Payer: 59

## 2012-07-19 ENCOUNTER — Ambulatory Visit (INDEPENDENT_AMBULATORY_CARE_PROVIDER_SITE_OTHER): Payer: 59 | Admitting: Internal Medicine

## 2012-07-19 VITALS — BP 122/80 | HR 82 | Temp 97.6°F | Ht 74.0 in | Wt 267.4 lb

## 2012-07-19 DIAGNOSIS — J309 Allergic rhinitis, unspecified: Secondary | ICD-10-CM

## 2012-07-19 DIAGNOSIS — E119 Type 2 diabetes mellitus without complications: Secondary | ICD-10-CM

## 2012-07-19 DIAGNOSIS — E785 Hyperlipidemia, unspecified: Secondary | ICD-10-CM

## 2012-07-19 DIAGNOSIS — I10 Essential (primary) hypertension: Secondary | ICD-10-CM

## 2012-07-19 DIAGNOSIS — Z0000 Encounter for general adult medical examination without abnormal findings: Secondary | ICD-10-CM

## 2012-07-19 LAB — LIPID PANEL
Cholesterol: 174 mg/dL (ref 0–200)
HDL: 50.3 mg/dL (ref >39.00)
LDL Cholesterol: 106 mg/dL — ABNORMAL HIGH (ref 0–99)
Total CHOL/HDL Ratio: 3
Triglycerides: 88 mg/dL (ref 0.0–149.0)
VLDL: 17.6 mg/dL (ref 0.0–40.0)

## 2012-07-19 LAB — BASIC METABOLIC PANEL
BUN: 11 mg/dL (ref 6–23)
Calcium: 9.2 mg/dL (ref 8.4–10.5)
Chloride: 103 mEq/L (ref 96–112)
Creatinine, Ser: 0.9 mg/dL (ref 0.4–1.5)

## 2012-07-19 MED ORDER — PRAVASTATIN SODIUM 40 MG PO TABS: 40.0000 mg | ORAL_TABLET | Freq: Every day | ORAL | Status: DC

## 2012-07-19 MED ORDER — LEVOCETIRIZINE DIHYDROCHLORIDE 5 MG PO TABS: 5.0000 mg | ORAL_TABLET | Freq: Every day | ORAL | Status: DC | PRN

## 2012-07-19 NOTE — Assessment & Plan Note (Signed)
With large recent wt gain, for a1c today o/w stable overall by hx and exam, most recent data reviewed with pt, and pt to continue medical treatment as before Lab Results  Component Value Date   HGBA1C 6.8* 01/15/2012

## 2012-07-19 NOTE — Assessment & Plan Note (Signed)
stable overall by hx and exam, most recent data reviewed with pt, and pt to continue medical treatment as before BP Readings from Last 3 Encounters:  07/19/12 122/80  04/08/12 122/80  01/15/12 120/84

## 2012-07-19 NOTE — Progress Notes (Signed)
Subjective:    Patient ID: Howard [name], male    DOB: December 21, 1960, 50 y.o.   MRN: [id number]  HPI  Here to f/u; overall doing ok,  Pt denies chest pain, increased sob or doe, wheezing, orthopnea, PND, increased LE swelling, palpitations, dizziness or syncope.  Pt denies new neurological symptoms such as new headache, or facial or extremity weakness or numbness   Pt denies polydipsia, polyuria, or low sugar symptoms such as weakness or confusion improved with po intake.  Pt states overall good compliance with meds, trying to follow lower cholesterol, diabetic diet, wt overall stable but little exercise however.  Has seen Dr Irving Copas for right pain and swelling, not better with one cortisone shot, MRI showed meniscal and cartilage tears, for surgury later this month.  HAs gaine 14 ls due to being less active.  Does have several wks ongoing nasal allergy symptoms with clear congestion, itch and sneeze, without fever, pain, ST, cough or wheezing., but allegra too expensive Past Medical History  Diagnosis Date  . Diabetes mellitus   . PSA, INCREASED 05/11/2009    Qualifier: Diagnosis of  By: Yetta Barre MD, Bernadene Bell.   . HYPERTENSION 02/06/2008    Qualifier: Diagnosis of  By: Posey Rea MD, Georgina Quint   . HYPERLIPIDEMIA 10/06/2007    Qualifier: Diagnosis of  By: Posey Rea MD, Georgina Quint   . DIABETES MELLITUS, TYPE II 05/24/2007    Qualifier: Diagnosis of  By: Dance CMA (AAMA), Kim    . Lumbar disc disease     s/p surgury approx 1992  . Allergic rhinitis, cause unspecified 01/15/2012   Past Surgical History  Procedure Date  . Back surgery     Lumbar/Ruptured Disk  . Hernia repair may 2012    left inguinal    reports that he has never smoked. He has never used smokeless tobacco. He reports that he drinks alcohol. He reports that he does not use illicit drugs. family history includes Cancer in his mother; Diabetes in his father and mother; and Hypertension in his father and mother. No Known  Allergies Current Outpatient Prescriptions on File Prior to Visit  Medication Sig Dispense Refill  . aspirin 81 MG EC tablet Take 1 tablet (81 mg total) by mouth daily. Swallow whole.  30 tablet  12  . fexofenadine (ALLEGRA) 180 MG tablet Take 1 tablet (180 mg total) by mouth daily.  90 tablet  3  . glucose blood (ONE TOUCH ULTRA TEST) test strip Use as instructed once daily. Diagnosis 250.0  100 each  12  . Lancets MISC 1 application by Does not apply route daily.  100 each  12  . lisinopril (PRINIVIL,ZESTRIL) 5 MG tablet Take 1 tablet (5 mg total) by mouth daily.  90 tablet  3  . metFORMIN (GLUCOPHAGE-XR) 500 MG 24 hr tablet Take 2 tablets (1,000 mg total) by mouth daily with breakfast.  180 tablet  3  . naproxen (NAPROSYN) 500 MG tablet Take 1 tablet (500 mg total) by mouth 2 (two) times daily with a meal.  60 tablet  5   Review of Systems  Constitutional: Negative for diaphoresis and unexpected weight change.  HENT: Negative for tinnitus.   Eyes: Negative for photophobia and visual disturbance.  Respiratory: Negative for choking and stridor.   Gastrointestinal: Negative for vomiting and blood in stool.  Genitourinary: Negative for hematuria and decreased urine volume.  Musculoskeletal: Negative for gait problem.  Skin: Negative for color change and wound.  Neurological: Negative for tremors and  numbness.  Psychiatric/Behavioral: Negative for decreased concentration. The patient is not hyperactive.       Objective:   Physical Exam BP 122/80  Pulse 82  Temp 97.6 F (36.4 C) (Oral)  Ht 6\' 2"  (1.88 m)  Wt 267 lb 6 oz (121.281 kg)  BMI 34.33 kg/m2  SpO2 96% Physical Exam  VS noted Constitutional: Pt appears well-developed and well-nourished.  HENT: Head: Normocephalic.  Right Ear: External ear normal.  Left Ear: External ear normal.  Bilat tm's mild erythema.  Sinus nontender.  Pharynx mild erythema Eyes: Conjunctivae and EOM are normal. Pupils are equal, round, and reactive to  light.  Neck: Normal range of motion. Neck supple.  Cardiovascular: Normal rate and regular rhythm.   Pulmonary/Chest: Effort normal and breath sounds normal.  Neurological: Pt is alert. Not confused  Skin: Skin is warm. No erythema.  Psychiatric: Pt behavior is normal. Thought content normal.  Right knee warm with trace effusion    Assessment & Plan:

## 2012-07-19 NOTE — Patient Instructions (Addendum)
Take all new medications as prescribed - the generic xyzal for allergies \\Ok  to increase the pravastatin to 40 mg per day Continue all other medications as before Please have the pharmacy call with any refills you may need. Please continue your efforts at being more active, low cholesterol diabetic diet, and weight control. Please go to LAB in the Basement for the blood and/or urine tests to be done today You will be contacted by phone if any changes need to be made immediately.  Otherwise, you will receive a letter about your results with an explanation. Please remember to sign up for My Chart at your earliest convenience, as this will be important to you in the future with finding out test results. Please return in 6 mo with Lab testing done 3-5 days before

## 2012-07-19 NOTE — Assessment & Plan Note (Signed)
Allegra too expensive, ok for xyzal 5 qd prn

## 2012-07-19 NOTE — Assessment & Plan Note (Signed)
Uncontrolled, to incr the pravastatin to 40 qd,  to f/u any worsening symptoms or concerns, cont labs

## 2012-10-20 HISTORY — PX: KNEE ARTHROSCOPY: SUR90

## 2012-11-18 ENCOUNTER — Encounter: Payer: Self-pay | Admitting: Internal Medicine

## 2012-11-18 ENCOUNTER — Ambulatory Visit (INDEPENDENT_AMBULATORY_CARE_PROVIDER_SITE_OTHER): Payer: 59 | Admitting: Internal Medicine

## 2012-11-18 VITALS — BP 130/80 | HR 73 | Temp 98.1°F | Ht 74.0 in | Wt 259.4 lb

## 2012-11-18 DIAGNOSIS — Z0000 Encounter for general adult medical examination without abnormal findings: Secondary | ICD-10-CM

## 2012-11-18 DIAGNOSIS — E119 Type 2 diabetes mellitus without complications: Secondary | ICD-10-CM

## 2012-11-18 DIAGNOSIS — I10 Essential (primary) hypertension: Secondary | ICD-10-CM

## 2012-11-18 DIAGNOSIS — E785 Hyperlipidemia, unspecified: Secondary | ICD-10-CM

## 2012-11-18 LAB — GLUCOSE, POCT (MANUAL RESULT ENTRY): POC Glucose: 296 mg/dl — AB (ref 70–99)

## 2012-11-18 MED ORDER — METFORMIN HCL ER 500 MG PO TB24: ORAL_TABLET | ORAL | Status: DC

## 2012-11-18 MED ORDER — LANCETS MISC: 1.0000 "application " | Freq: Every day | Status: DC

## 2012-11-18 MED ORDER — METFORMIN HCL ER 500 MG PO TB24: 1000.0000 mg | ORAL_TABLET | Freq: Every day | ORAL | Status: DC

## 2012-11-18 MED ORDER — GLUCOSE BLOOD VI STRP: ORAL_STRIP | Status: DC

## 2012-11-18 NOTE — Assessment & Plan Note (Addendum)
ECG reviewed as per emr, stable overall by history and exam, recent data reviewed with pt, and pt to continue medical treatment as before,  to f/u any worsening symptoms or concerns BP Readings from Last 3 Encounters:  11/18/12 130/80  07/19/12 122/80  04/08/12 122/80

## 2012-11-18 NOTE — Assessment & Plan Note (Signed)
Tolerating the increased statin, ok to cont as is, for f/u lipid next visit Lab Results  Component Value Date   LDLCALC 106* 07/19/2012

## 2012-11-18 NOTE — Assessment & Plan Note (Signed)
Uncontrolled, to re-start metformin, but at 3 per day as a1c has been increasing even before the recent knee surgury; to cont work on diet and activity,  to f/u any worsening symptoms or concerns

## 2012-11-18 NOTE — Progress Notes (Signed)
Subjective:    Patient ID: Howard [name], male    DOB: 01/11/1961, 52 y.o.   MRN: [id number]  HPI  Here to f/u; overall doing ok,  Pt denies chest pain, increased sob or doe, wheezing, orthopnea, PND, increased LE swelling, palpitations, dizziness or syncope.  Pt denies polydipsia, polyuria, or low sugar symptoms such as weakness or confusion improved with po intake.  Pt denies new neurological symptoms such as new headache, or facial or extremity weakness or numbness.   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  CBG 296 in the office today, had no meds at all today.  CBG at home about 290.  Peak wt had been up to 275, has gotten back on track with diet and can now afford med, and now back to work for last 2 wks,  Knee surgury with good inmprovement, pain minimal, has occas 'catch" but not thought to be signficant.  Past Medical History  Diagnosis Date  . Diabetes mellitus   . PSA, INCREASED 05/11/2009    Qualifier: Diagnosis of  By: Yetta Barre MD, Bernadene Bell.   . HYPERTENSION 02/06/2008    Qualifier: Diagnosis of  By: Posey Rea MD, Georgina Quint   . HYPERLIPIDEMIA 10/06/2007    Qualifier: Diagnosis of  By: Posey Rea MD, Georgina Quint   . DIABETES MELLITUS, TYPE II 05/24/2007    Qualifier: Diagnosis of  By: Dance CMA (AAMA), Kim    . Lumbar disc disease     s/p surgury approx 1992  . Allergic rhinitis, cause unspecified 01/15/2012   Past Surgical History  Procedure Date  . Back surgery     Lumbar/Ruptured Disk  . Hernia repair may 2012    left inguinal    reports that he has never smoked. He has never used smokeless tobacco. He  reports that he drinks alcohol. He reports that he does not use illicit drugs. family history includes Cancer in his mother; Diabetes in his father and mother; and Hypertension in his father and mother. No Known Allergies Current Outpatient Prescriptions on File Prior to Visit  Medication Sig Dispense Refill  . lisinopril (PRINIVIL,ZESTRIL) 5 MG tablet Take 1 tablet (5 mg total) by mouth daily.  90 tablet  3  . pravastatin (PRAVACHOL) 40 MG tablet Take 1 tablet (40 mg total) by mouth daily.  90 tablet  3  . aspirin 81 MG EC tablet Take 1 tablet (81 mg total) by mouth daily. Swallow whole.  30 tablet  12  . fexofenadine (ALLEGRA) 180 MG tablet Take 1 tablet (180 mg total) by mouth daily.  90 tablet  3  . levocetirizine (XYZAL) 5 MG tablet Take 1 tablet (5 mg total) by mouth daily as needed for allergies.  90 tablet  3  . naproxen (NAPROSYN) 500 MG tablet Take 1 tablet (500 mg total) by mouth 2 (two) times daily with a meal.  60 tablet  5   Review of Systems  Constitutional: Negative for unexpected weight change, or unusual diaphoresis  HENT: Negative for tinnitus.   Eyes: Negative for photophobia and visual disturbance.  Respiratory: Negative for choking and  stridor.   Gastrointestinal: Negative for vomiting and blood in stool.  Genitourinary: Negative for hematuria and decreased urine volume.  Musculoskeletal: Negative for acute joint swelling Skin: Negative for color change and wound.  Neurological: Negative for tremors and numbness other than noted  Psychiatric/Behavioral: Negative for decreased concentration or  hyperactivity.       Objective:   Physical Exam BP 130/80  Pulse 73  Temp 98.1 F (36.7 C) (Oral)  Ht 6\' 2"  (1.88 m)  Wt 259 lb 6 oz (117.652 kg)  BMI 33.30 kg/m2  SpO2 96% VS noted,  Constitutional: Pt appears well-developed and well-nourished.  HENT: Head: NCAT.  Right Ear: External ear normal.  Left Ear: External ear normal.  Eyes: Conjunctivae and EOM are  normal. Pupils are equal, round, and reactive to light.  Neck: Normal range of motion. Neck supple.  Cardiovascular: Normal rate and regular rhythm.   Pulmonary/Chest: Effort normal and breath sounds normal.  Neurological: Pt is alert. Not confused  Skin: Skin is warm. No erythema.  Psychiatric: Pt behavior is normal. Thought content normal.     Assessment & Plan:

## 2012-11-18 NOTE — Patient Instructions (Addendum)
Your EKG was OK today Your blood sugar was 296 in the ffice today OK to start taking 3 of the metformin ER 500 mg pills Please continue all other medications as before, and refills have been done if requested. Please re-start the Aspirin 81 mg per day - needs to be the "Coated" kind Please continue all other medications as before, and refills have been done if requested. Please have the pharmacy call with any other refills you may need. Thank you for enrolling in MyChart. Please follow the instructions below to securely access your online medical record. MyChart allows you to send messages to your doctor, view your test results, renew your prescriptions, schedule appointments, and more. To Log into My Chart online, please go by [employer] or [employer] to [email or web address], or download the MyChart App from the [employer] .  Your Username is: [email or web address]  (pass [email or web address]) Please send a practice Message on Mychart later today. Please send a message or let us know if you want to be referred for the Diabetes Class. Please return in 3 months, or sooner if needed, with Lab testing done 3-5 days before

## 2012-11-29 LAB — HM DIABETES EYE EXAM: HM Diabetic Eye Exam: NOT DETECTED

## 2012-11-30 ENCOUNTER — Encounter: Payer: Self-pay | Admitting: Internal Medicine

## 2012-12-04 ENCOUNTER — Other Ambulatory Visit: Payer: Self-pay

## 2013-01-14 ENCOUNTER — Ambulatory Visit: Payer: 59 | Admitting: Internal Medicine

## 2013-01-26 ENCOUNTER — Other Ambulatory Visit: Payer: Self-pay | Admitting: Internal Medicine

## 2013-02-09 ENCOUNTER — Other Ambulatory Visit (INDEPENDENT_AMBULATORY_CARE_PROVIDER_SITE_OTHER): Payer: 59

## 2013-02-09 DIAGNOSIS — E119 Type 2 diabetes mellitus without complications: Secondary | ICD-10-CM

## 2013-02-09 DIAGNOSIS — Z0000 Encounter for general adult medical examination without abnormal findings: Secondary | ICD-10-CM

## 2013-02-09 LAB — HEPATIC FUNCTION PANEL
ALT: 19 U/L (ref 0–53)
AST: 20 U/L (ref 0–37)
Albumin: 4.1 g/dL (ref 3.5–5.2)
Total Bilirubin: 0.7 mg/dL (ref 0.3–1.2)

## 2013-02-09 LAB — CBC WITH DIFFERENTIAL/PLATELET
Basophils Absolute: 0 10*3/uL (ref 0.0–0.1)
Eosinophils Relative: 1.3 % (ref 0.0–5.0)
HCT: 40.8 % (ref 39.0–52.0)
Hemoglobin: 13.5 g/dL (ref 13.0–17.0)
Lymphs Abs: 1.2 10*3/uL (ref 0.7–4.0)
MCV: 83.1 fl (ref 78.0–100.0)
Monocytes Absolute: 0.4 10*3/uL (ref 0.1–1.0)
Monocytes Relative: 12.2 % — ABNORMAL HIGH (ref 3.0–12.0)
Neutro Abs: 1.7 10*3/uL (ref 1.4–7.7)
Platelets: 211 10*3/uL (ref 150.0–400.0)
RDW: 14.3 % (ref 11.5–14.6)

## 2013-02-09 LAB — URINALYSIS, ROUTINE W REFLEX MICROSCOPIC
Bilirubin Urine: NEGATIVE
Hgb urine dipstick: NEGATIVE
Ketones, ur: NEGATIVE
Leukocytes, UA: NEGATIVE
Nitrite: NEGATIVE

## 2013-02-09 LAB — LIPID PANEL
HDL: 41.4 mg/dL (ref >39.00)
Triglycerides: 49 mg/dL (ref 0.0–149.0)
VLDL: 9.8 mg/dL (ref 0.0–40.0)

## 2013-02-09 LAB — BASIC METABOLIC PANEL
Calcium: 9.4 mg/dL (ref 8.4–10.5)
GFR: 122.26 mL/min (ref >60.00)
Glucose, Bld: 132 mg/dL — ABNORMAL HIGH (ref 70–99)
Potassium: 4.1 mEq/L (ref 3.5–5.1)
Sodium: 139 mEq/L (ref 135–145)

## 2013-02-09 LAB — MICROALBUMIN / CREATININE URINE RATIO
Creatinine,U: 287.6 mg/dL
Microalb Creat Ratio: 0.6 mg/g (ref 0.0–30.0)
Microalb, Ur: 1.6 mg/dL (ref 0.0–1.9)

## 2013-02-09 LAB — HEMOGLOBIN A1C: Hgb A1c MFr Bld: 8.7 % — ABNORMAL HIGH (ref 4.6–6.5)

## 2013-02-10 ENCOUNTER — Ambulatory Visit (INDEPENDENT_AMBULATORY_CARE_PROVIDER_SITE_OTHER): Payer: 59 | Admitting: Internal Medicine

## 2013-02-10 ENCOUNTER — Encounter: Payer: Self-pay | Admitting: Internal Medicine

## 2013-02-10 VITALS — BP 110/68 | HR 68 | Temp 98.3°F | Ht 74.0 in | Wt 256.2 lb

## 2013-02-10 DIAGNOSIS — Z0000 Encounter for general adult medical examination without abnormal findings: Secondary | ICD-10-CM

## 2013-02-10 DIAGNOSIS — E119 Type 2 diabetes mellitus without complications: Secondary | ICD-10-CM

## 2013-02-10 MED ORDER — METFORMIN HCL ER 500 MG PO TB24: ORAL_TABLET | ORAL | Status: DC

## 2013-02-10 MED ORDER — LISINOPRIL 5 MG PO TABS: ORAL_TABLET | ORAL | Status: DC

## 2013-02-10 MED ORDER — PRAVASTATIN SODIUM 40 MG PO TABS: 40.0000 mg | ORAL_TABLET | Freq: Every day | ORAL | Status: DC

## 2013-02-10 MED ORDER — LANCETS MISC: 1.0000 "application " | Freq: Every day | Status: DC

## 2013-02-10 MED ORDER — GLUCOSE BLOOD VI STRP: ORAL_STRIP | Status: DC

## 2013-02-10 NOTE — Assessment & Plan Note (Signed)
Uncontrolled, to incr the metformin to 4 qd, follow better diet, wt control, refer DM educatoin

## 2013-02-10 NOTE — Progress Notes (Signed)
Subjective:    Patient ID: Howard [name], male    DOB: 09/29/1961, 52 y.o.   MRN: [id number]  HPI  Here for wellness and f/u;  Overall doing ok;  Pt denies CP, worsening SOB, DOE, wheezing, orthopnea, PND, worsening LE edema, palpitations, dizziness or syncope.  Pt denies neurological change such as new headache, facial or extremity weakness.  Pt denies polydipsia, polyuria, or low sugar symptoms. Pt states overall good compliance with treatment and medications, good tolerability, and has been trying to follow lower cholesterol diet.  Pt denies worsening depressive symptoms, suicidal ideation or panic. No fever, night sweats, wt loss, loss of appetite, or other constitutional symptoms.  Pt states good ability with ADL's, has low fall risk, home safety reviewed and adequate, no other significant changes in hearing or vision, and only occasionally active with exercise.  Admits to some dietary noncompliance recently, has good compliance with meds.  Due for colon screen.  Asks about ? DM diet, had deferred on referral for DM education last visit Past Medical History  Diagnosis Date  . Diabetes mellitus   . PSA, INCREASED 05/11/2009    Qualifier: Diagnosis of  By: Yetta Barre MD, Bernadene Bell.   . HYPERTENSION 02/06/2008    Qualifier: Diagnosis of  By: Posey Rea MD, Georgina Quint   . HYPERLIPIDEMIA 10/06/2007    Qualifier: Diagnosis of  By: Posey Rea MD, Georgina Quint   . DIABETES MELLITUS, TYPE II 05/24/2007    Qualifier: Diagnosis of  By: Dance CMA (AAMA), Kim    . Lumbar disc disease     s/p surgury approx 1992  . Allergic rhinitis, cause unspecified 01/15/2012   Past Surgical History  Procedure Laterality Date  . Back surgery      Lumbar/Ruptured Disk  . Hernia repair  may 2012    left inguinal    reports that he has never smoked. He has never used smokeless tobacco. He reports that  drinks alcohol. He reports that he does not use illicit drugs. family history includes Cancer in his mother; Diabetes in his  father and mother; and Hypertension in his father and mother. No Known Allergies Current Outpatient Prescriptions on File Prior to Visit  Medication Sig Dispense Refill  . fexofenadine (ALLEGRA) 180 MG tablet Take 1 tablet (180 mg total) by mouth daily.  90 tablet  3   No current facility-administered medications on file prior to visit.   Review of Systems Constitutional: Negative for diaphoresis, activity change, appetite change or unexpected weight change.  HENT: Negative for hearing loss, ear pain, facial swelling, mouth sores and neck stiffness.   Eyes: Negative for pain, redness and visual disturbance.  Respiratory: Negative for shortness of breath and wheezing.   Cardiovascular: Negative for chest pain and palpitations.  Gastrointestinal: Negative for diarrhea, blood in stool, abdominal distention or other pain Genitourinary: Negative for hematuria, flank pain or change in urine volume.  Musculoskeletal: Negative for myalgias and joint swelling.  Skin: Negative for color change and wound.  Neurological: Negative for syncope and numbness. other than noted Hematological: Negative for adenopathy.  Psychiatric/Behavioral: Negative for hallucinations, self-injury, decreased concentration and agitation.      Objective:   Physical Exam BP 110/68  Pulse 68  Temp(Src) 98.3 F (36.8 C) (Oral)  Ht 6\' 2"  (1.88 m)  Wt 256 lb 4 oz (116.234 kg)  BMI 32.89 kg/m2  SpO2 94% VS noted,  Constitutional: Pt is oriented to person, place, and time. Appears well-developed and well-nourished.  Head: Normocephalic and atraumatic.  Right Ear: External ear normal.  Left Ear: External ear normal.  Nose: Nose normal.  Mouth/Throat: Oropharynx is clear and moist.  Eyes: Conjunctivae and EOM are normal. Pupils are equal, round, and reactive to light.  Neck: Normal range of motion. Neck supple. No JVD present. No tracheal deviation present.  Cardiovascular: Normal rate, regular rhythm, normal heart  sounds and intact distal pulses.   Pulmonary/Chest: Effort normal and breath sounds normal.  Abdominal: Soft. Bowel sounds are normal. There is no tenderness. No HSM  Musculoskeletal: Normal range of motion. Exhibits no edema.  Lymphadenopathy:  Has no cervical adenopathy.  Neurological: Pt is alert and oriented to person, place, and time. Pt has normal reflexes. No cranial nerve deficit.  Skin: Skin is warm and dry. No rash noted.  Psychiatric:  Has  normal mood and affect. Behavior is normal.     Assessment & Plan:

## 2013-02-10 NOTE — Assessment & Plan Note (Signed)

## 2013-02-10 NOTE — Patient Instructions (Addendum)
OK to increase the metformin to 4 pills per day Please continue your efforts at being more active, low cholesterol diet, and weight control. You are otherwise up to date with prevention measures today. You will be contacted regarding the referral for: Diabetes Education class You will be contacted regarding the referral for: colonoscopy Thank you for enrolling in MyChart. Please follow the instructions below to securely access your online medical record. MyChart allows you to send messages to your doctor, view your test results, renew your prescriptions, schedule appointments, and more Please return in 6 months, or sooner if needed, with Lab testing done 3-5 days before

## 2013-02-11 ENCOUNTER — Encounter: Payer: Self-pay | Admitting: Internal Medicine

## 2013-04-01 ENCOUNTER — Ambulatory Visit (AMBULATORY_SURGERY_CENTER): Payer: 59 | Admitting: *Deleted

## 2013-04-01 VITALS — Ht 74.0 in | Wt 251.0 lb

## 2013-04-01 DIAGNOSIS — Z1211 Encounter for screening for malignant neoplasm of colon: Secondary | ICD-10-CM

## 2013-04-01 MED ORDER — MOVIPREP 100 G PO SOLR: ORAL | Status: DC

## 2013-04-04 ENCOUNTER — Encounter: Payer: Self-pay | Admitting: Internal Medicine

## 2013-04-15 ENCOUNTER — Ambulatory Visit (AMBULATORY_SURGERY_CENTER): Payer: 59 | Admitting: Internal Medicine

## 2013-04-15 ENCOUNTER — Encounter: Payer: Self-pay | Admitting: Internal Medicine

## 2013-04-15 VITALS — BP 132/73 | HR 51 | Temp 97.7°F | Resp 14 | Ht 74.0 in | Wt 251.0 lb

## 2013-04-15 DIAGNOSIS — D126 Benign neoplasm of colon, unspecified: Secondary | ICD-10-CM

## 2013-04-15 DIAGNOSIS — Z1211 Encounter for screening for malignant neoplasm of colon: Secondary | ICD-10-CM

## 2013-04-15 LAB — GLUCOSE, CAPILLARY: Glucose-Capillary: 98 mg/dL (ref 70–99)

## 2013-04-15 MED ORDER — SODIUM CHLORIDE 0.9 % IV SOLN: 500.0000 mL | INTRAVENOUS | Status: DC

## 2013-04-15 NOTE — Patient Instructions (Addendum)
YOU HAD AN ENDOSCOPIC PROCEDURE TODAY AT THE [facility]: Refer to the procedure report that was given to you for any specific questions about what was found during the examination.  If the procedure report does not answer your questions, please call your gastroenterologist to clarify.  If you requested that your care partner not be given the details of your procedure findings, then the procedure report has been included in a sealed envelope for you to review at your convenience later.  YOU SHOULD EXPECT: Some feelings of bloating in the abdomen. Passage of more gas than usual.  Walking can help get rid of the air that was put into your GI tract during the procedure and reduce the bloating. If you had a lower endoscopy (such as a colonoscopy or flexible sigmoidoscopy) you may notice spotting of blood in your stool or on the toilet paper. If you underwent a bowel prep for your procedure, then you may not have a normal bowel movement for a few days.  DIET: Your first meal following the procedure should be a light meal and then it is ok to progress to your normal diet.  A half-sandwich or bowl of soup is an example of a good first meal.  Heavy or fried foods are harder to digest and may make you feel nauseous or bloated.  Likewise meals heavy in dairy and vegetables can cause extra gas to form and this can also increase the bloating.  Drink plenty of fluids but you should avoid alcoholic beverages for 24 hours.  ACTIVITY: Your care partner should take you home directly after the procedure.  You should plan to take it easy, moving slowly for the rest of the day.  You can resume normal activity the day after the procedure however you should NOT DRIVE or use heavy machinery for 24 hours (because of the sedation medicines used during the test).    SYMPTOMS TO REPORT IMMEDIATELY: A gastroenterologist can be reached at any hour.  During normal business hours, 8:30 AM to 5:00 PM Monday through Friday,  call [phone].  After hours and on weekends, please call the GI answering service at [phone] who will take a message and have the physician on call contact you.   Following lower endoscopy (colonoscopy or flexible sigmoidoscopy):  Excessive amounts of blood in the stool  Significant tenderness or worsening of abdominal pains  Swelling of the abdomen that is new, acute  Fever of 100F or higher FOLLOW UP: If any biopsies were taken you will be contacted by phone or by letter within the next 1-3 weeks.  Call your gastroenterologist if you have not heard about the biopsies in 3 weeks.  Our staff will call the home number listed on your records the next business day following your procedure to check on you and address any questions or concerns that you may have at that time regarding the information given to you following your procedure. This is a courtesy call and so if there is no answer at the home number and we have not heard from you through the emergency physician on call, we will assume that you have returned to your regular daily activities without incident.  SIGNATURES/CONFIDENTIALITY: You and/or your care partner have signed paperwork which will be entered into your electronic medical record.  These signatures attest to the fact that that the information above on your After Visit Summary has been reviewed and is understood.  Full responsibility of the confidentiality of this discharge   information lies with you and/or your care-partner.  Hold aspirin, aspirin products, and anti-inflammatory medication for 2 weeks Await pathology results If the polyps removed today are proven to be pre-cancerous polyps, you will need a repeat colonoscopy in 3 years. Otherwise you should continue to follow colorectal cancer screening guidelines for "routine risk" patients with colonoscopy in 10 years.

## 2013-04-15 NOTE — Progress Notes (Signed)
Patient did not have preoperative order for IV antibiotic SSI prophylaxis. (G8918)  Patient did not experience any of the following events: a burn prior to discharge; a fall within the facility; wrong site/side/patient/procedure/implant event; or a hospital transfer or hospital admission upon discharge from the facility. (G8907)  

## 2013-04-15 NOTE — Op Note (Signed)
[facility] [address] [place], [address]   COLONOSCOPY PROCEDURE REPORT  PATIENT: Howard [name], Howard [name]  MR#: [id number] BIRTHDATE: 11/10/1960 , 52  yrs. old GENDER: Male ENDOSCOPIST: Beverley Fiedler, MD REFERRED JS:EGBTD John, M.D. PROCEDURE DATE:  04/15/2013 PROCEDURE:   Colonoscopy with snare polypectomy ASA CLASS:   Class II INDICATIONS:average risk screening and first colonoscopy. MEDICATIONS: MAC sedation, administered by CRNA and propofol (Diprivan) 400mg  IV  DESCRIPTION OF PROCEDURE:   After the risks benefits and alternatives of the procedure were thoroughly explained, informed consent was obtained.  A digital rectal exam revealed no rectal mass.   The LB [id number]  endoscope was introduced through the anus and advanced to the cecum, which was identified by both the appendix and ileocecal valve. No adverse events experienced. The quality of the prep was good, using MoviPrep  The instrument was then slowly withdrawn as the colon was fully examined.    COLON FINDINGS: A pedunculated polyp measuring 10 mm in size was found in the rectum.  A polypectomy was performed using snare cautery.  The resection was complete and the polyp tissue was completely retrieved.   Two sessile polyps measuring 4 and 6 mm in size were found in the sigmoid colon.  Polypectomy was performed using cold snare.  All resections were complete and all polyp tissue was completely retrieved.  Retroflexed views revealed internal hemorrhoids. The time to cecum=2 minutes 14 seconds. Withdrawal time=18 minutes 33 seconds.  The scope was withdrawn and the procedure completed.  COMPLICATIONS: There were no complications.  ENDOSCOPIC IMPRESSION: 1.   Pedunculated polyp measuring 10 mm in size was found in the rectum; polypectomy was performed using snare cautery 2.   Two sessile polyps measuring 4 and 6 mm in size were found in the sigmoid colon; Polypectomy was performed using  cold snare  RECOMMENDATIONS: 1.  Hold aspirin, aspirin products, and anti-inflammatory medication for 2 weeks. 2.  Await pathology results 3.  If the polyps removed today are proven to be adenomatous (pre-cancerous) polyps, you will need a colonoscopy in 3 years. Otherwise you should continue to follow colorectal cancer screening guidelines for "routine risk" patients with a colonoscopy in 10 years.  You will receive a letter within 1-2 weeks with the results of your biopsy as well as final recommendations.  Please call my office if you have not received a letter after 3 weeks.   eSigned:  Beverley Fiedler, MD 04/15/2013 10:23 AM   cc: The Patient and Corwin Levins, MD   PATIENT NAME:  Howard [name], Howard [name] MR#: [id number]

## 2013-04-15 NOTE — Progress Notes (Signed)
Called to room to assist during endoscopic procedure.  Patient ID and intended procedure confirmed with present staff. Received instructions for my participation in the procedure from the performing physician.  

## 2013-04-18 ENCOUNTER — Telehealth: Payer: Self-pay | Admitting: *Deleted

## 2013-04-18 NOTE — Telephone Encounter (Signed)
  Follow up Call-  Call back number 04/15/2013  Post procedure Call Back phone  # [phone]  Permission to leave phone message Yes     Patient questions:  Do you have a fever, pain , or abdominal swelling? no Pain Score  0 *  Have you tolerated food without any problems? yes  Have you been able to return to your normal activities? yes  Do you have any questions about your discharge instructions: Diet   no Medications  no Follow up visit  no  Do you have questions or concerns about your Care? no  Actions: * If pain score is 4 or above: No action needed, pain <4.

## 2013-04-19 ENCOUNTER — Encounter: Payer: Self-pay | Admitting: Internal Medicine

## 2013-07-24 ENCOUNTER — Other Ambulatory Visit: Payer: Self-pay | Admitting: Internal Medicine

## 2013-08-10 ENCOUNTER — Other Ambulatory Visit (INDEPENDENT_AMBULATORY_CARE_PROVIDER_SITE_OTHER): Payer: 59

## 2013-08-10 DIAGNOSIS — E119 Type 2 diabetes mellitus without complications: Secondary | ICD-10-CM

## 2013-08-10 LAB — BASIC METABOLIC PANEL
BUN: 13 mg/dL (ref 6–23)
CO2: 30 mEq/L (ref 19–32)
Calcium: 9.2 mg/dL (ref 8.4–10.5)
Chloride: 105 mEq/L (ref 96–112)
Creatinine, Ser: 0.9 mg/dL (ref 0.4–1.5)
GFR: 118.79 mL/min (ref >60.00)
Glucose, Bld: 113 mg/dL — ABNORMAL HIGH (ref 70–99)
Sodium: 140 mEq/L (ref 135–145)

## 2013-08-10 LAB — HEPATIC FUNCTION PANEL
Albumin: 3.8 g/dL (ref 3.5–5.2)
Alkaline Phosphatase: 59 U/L (ref 39–117)
Total Protein: 6.8 g/dL (ref 6.0–8.3)

## 2013-08-10 LAB — LIPID PANEL
Cholesterol: 143 mg/dL (ref 0–200)
HDL: 45.2 mg/dL (ref >39.00)
Triglycerides: 62 mg/dL (ref 0.0–149.0)
VLDL: 12.4 mg/dL (ref 0.0–40.0)

## 2013-08-10 LAB — HEMOGLOBIN A1C: Hgb A1c MFr Bld: 7 % — ABNORMAL HIGH (ref 4.6–6.5)

## 2013-08-12 ENCOUNTER — Ambulatory Visit (INDEPENDENT_AMBULATORY_CARE_PROVIDER_SITE_OTHER): Payer: 59 | Admitting: Internal Medicine

## 2013-08-12 ENCOUNTER — Encounter: Payer: Self-pay | Admitting: Internal Medicine

## 2013-08-12 VITALS — BP 150/90 | HR 120 | Temp 98.8°F | Ht 74.0 in | Wt 251.0 lb

## 2013-08-12 DIAGNOSIS — J309 Allergic rhinitis, unspecified: Secondary | ICD-10-CM

## 2013-08-12 DIAGNOSIS — I10 Essential (primary) hypertension: Secondary | ICD-10-CM

## 2013-08-12 DIAGNOSIS — E119 Type 2 diabetes mellitus without complications: Secondary | ICD-10-CM

## 2013-08-12 DIAGNOSIS — Z0000 Encounter for general adult medical examination without abnormal findings: Secondary | ICD-10-CM

## 2013-08-12 DIAGNOSIS — E785 Hyperlipidemia, unspecified: Secondary | ICD-10-CM

## 2013-08-12 MED ORDER — FEXOFENADINE HCL 180 MG PO TABS: 180.0000 mg | ORAL_TABLET | Freq: Every day | ORAL | Status: DC

## 2013-08-12 NOTE — Assessment & Plan Note (Signed)
Mild elev today likely situational, o/w stable overall by history and exam, recent data reviewed with pt, and pt to continue medical treatment as before,  to f/u any worsening symptoms or concerns BP Readings from Last 3 Encounters:  08/12/13 150/90  04/15/13 132/73  02/10/13 110/68

## 2013-08-12 NOTE — Assessment & Plan Note (Signed)
stable overall by history and exam, recent data reviewed with pt, and pt to continue medical treatment as before,  to f/u any worsening symptoms or concerns Lab Results  Component Value Date   LDLCALC 85 08/10/2013

## 2013-08-12 NOTE — Progress Notes (Signed)
Subjective:    Patient ID: Howard [name], male    DOB: June 28, 1961, 52 y.o.   MRN: [id number]  HPI Here to f/u; overall doing ok,  Pt denies chest pain, increased sob or doe, wheezing, orthopnea, PND, increased LE swelling, palpitations, dizziness or syncope.  Pt denies polydipsia, polyuria, or low sugar symptoms such as weakness or confusion improved with po intake.  Pt denies new neurological symptoms such as new headache, or facial or extremity weakness or numbness.   Pt states overall good compliance with meds, has been trying to follow lower cholesterol, diabetic diet, with wt overall stable,  but little exercise however. Lost 5 lbs in past 6 mo.  Brother passed away suddenly today, and pt just got the phone call before being checked in today. Does have several wks ongoing nasal allergy symptoms with clearish congestion, itch and sneezing, without fever, pain, ST, cough, swelling or wheezing.   Past Medical History  Diagnosis Date  . Diabetes mellitus   . PSA, INCREASED 05/11/2009    Qualifier: Diagnosis of  By: Yetta Barre MD, Bernadene Bell.   . HYPERTENSION 02/06/2008    Qualifier: Diagnosis of  By: Posey Rea MD, Georgina Quint   . HYPERLIPIDEMIA 10/06/2007    Qualifier: Diagnosis of  By: Posey Rea MD, Georgina Quint   . DIABETES MELLITUS, TYPE II 05/24/2007    Qualifier: Diagnosis of  By: Dance CMA (AAMA), Kim    . Lumbar disc disease     s/p surgury approx 1992  . Allergic rhinitis, cause unspecified 01/15/2012   Past Surgical History  Procedure Laterality Date  . Back surgery      Lumbar/Ruptured Disk  . Hernia repair  may 2012    left inguinal  . Knee arthroscopy    . Nasal septum surgery      reports that he has never smoked. He has never used smokeless tobacco. He reports that he drinks alcohol. He reports that he does not use illicit drugs. family history includes Cancer in his mother; Diabetes in his father and mother; Hypertension in his father and mother. There is no history of Colon  cancer. No Known Allergies Current Outpatient Prescriptions on File Prior to Visit  Medication Sig Dispense Refill  . glucose blood (ONE TOUCH ULTRA TEST) test strip Use as instructed once daily. Diagnosis 250.0  100 each  12  . Lancets MISC 1 application by Does not apply route daily.  100 each  12  . lisinopril (PRINIVIL,ZESTRIL) 5 MG tablet TAKE 1 TABLET EVERY DAY  90 tablet  3  . metFORMIN (GLUCOPHAGE-XR) 500 MG 24 hr tablet 4 tabs by mouth per day  360 tablet  3  . pravastatin (PRAVACHOL) 40 MG tablet Take 1 tablet (40 mg total) by mouth daily.  90 tablet  3  . pravastatin (PRAVACHOL) 40 MG tablet TAKE 1 TABLET (40 MG TOTAL) BY MOUTH DAILY.  90 tablet  3   No current facility-administered medications on file prior to visit.   Review of Systems  Constitutional: Negative for unexpected weight change, or unusual diaphoresis  HENT: Negative for tinnitus.   Eyes: Negative for photophobia and visual disturbance.  Respiratory: Negative for choking and stridor.   Gastrointestinal: Negative for vomiting and blood in stool.  Genitourinary: Negative for hematuria and decreased urine volume.  Musculoskeletal: Negative for acute joint swelling Skin: Negative for color change and wound.  Neurological: Negative for tremors and numbness other than noted  Psychiatric/Behavioral: Negative for decreased concentration or  hyperactivity.  '  Objective:   Physical Exam BP 150/90  Pulse 120  Temp(Src) 98.8 F (37.1 C) (Oral)  Ht 6\' 2"  (1.88 m)  Wt 251 lb (113.853 kg)  BMI 32.21 kg/m2  SpO2 95% VS noted,  Constitutional: Pt appears well-developed and well-nourished.  HENT: Head: NCAT.  Right Ear: External ear normal.  Left Ear: External ear normal.  Eyes: Conjunctivae and EOM are normal. Pupils are equal, round, and reactive to light.  Neck: Normal range of motion. Neck supple.  Cardiovascular: Normal rate and regular rhythm.   Pulmonary/Chest: Effort normal and breath sounds normal.  Abd:   Soft, NT, non-distended, + BS Neurological: Pt is alert. Not confused  Skin: Skin is warm. No erythema.  Psychiatric: Pt behavior is normal. Thought content normal.     Assessment & Plan:

## 2013-08-12 NOTE — Assessment & Plan Note (Signed)
To re-start the allegra prn

## 2013-08-12 NOTE — Patient Instructions (Signed)
OK to take the allegra as needed You can also take Mucinex (or it's generic off brand) for congestion, and tylenol as needed for pain. Please continue all other medications as before, and refills have been done if requested. Please have the pharmacy call with any other refills you may need. Please continue your efforts at being more active, low cholesterol diabetic diet, and weight control. Please check your blood pressure on a regular basis, with the goal being less than 140/90  Please remember to sign up for My Chart if you have not done so, as this will be important to you in the future with finding out test results, communicating by private email, and scheduling acute appointments online when needed.  Please return in 6 months, or sooner if needed, with Lab testing done 3-5 days before

## 2013-08-12 NOTE — Assessment & Plan Note (Signed)
stable overall by history and exam, recent data reviewed with pt, and pt to continue medical treatment as before,  to f/u any worsening symptoms or concerns  Lab Results  Component Value Date   HGBA1C 7.0* 08/10/2013

## 2013-12-01 ENCOUNTER — Encounter: Payer: Self-pay | Admitting: Internal Medicine

## 2013-12-01 LAB — HM DIABETES EYE EXAM: HM Diabetic Eye Exam: NOT DETECTED

## 2014-02-09 ENCOUNTER — Other Ambulatory Visit (INDEPENDENT_AMBULATORY_CARE_PROVIDER_SITE_OTHER): Payer: 59

## 2014-02-09 DIAGNOSIS — E119 Type 2 diabetes mellitus without complications: Secondary | ICD-10-CM

## 2014-02-09 DIAGNOSIS — Z0000 Encounter for general adult medical examination without abnormal findings: Secondary | ICD-10-CM

## 2014-02-09 LAB — URINALYSIS, ROUTINE W REFLEX MICROSCOPIC
BILIRUBIN URINE: NEGATIVE
HGB URINE DIPSTICK: NEGATIVE
KETONES UR: NEGATIVE
Leukocytes, UA: NEGATIVE
Nitrite: NEGATIVE
Specific Gravity, Urine: >=1.03 — AB (ref 1.000–1.030)
TOTAL PROTEIN, URINE-UPE24: NEGATIVE
URINE GLUCOSE: NEGATIVE
UROBILINOGEN UA: 0.2 (ref 0.0–1.0)
pH: 5.5 (ref 5.0–8.0)

## 2014-02-09 LAB — MICROALBUMIN / CREATININE URINE RATIO
CREATININE, U: 226.9 mg/dL
MICROALB UR: 1.8 mg/dL (ref 0.0–1.9)
MICROALB/CREAT RATIO: 0.8 mg/g (ref 0.0–30.0)

## 2014-02-09 LAB — CBC WITH DIFFERENTIAL/PLATELET
BASOS ABS: 0 10*3/uL (ref 0.0–0.1)
Basophils Relative: 0.8 % (ref 0.0–3.0)
EOS PCT: 1.5 % (ref 0.0–5.0)
Eosinophils Absolute: 0.1 10*3/uL (ref 0.0–0.7)
HCT: 43.2 % (ref 39.0–52.0)
Hemoglobin: 14.5 g/dL (ref 13.0–17.0)
Lymphocytes Relative: 29 % (ref 12.0–46.0)
Lymphs Abs: 1.1 10*3/uL (ref 0.7–4.0)
MCHC: 33.5 g/dL (ref 30.0–36.0)
MCV: 84.2 fl (ref 78.0–100.0)
MONO ABS: 0.4 10*3/uL (ref 0.1–1.0)
Monocytes Relative: 10.6 % (ref 3.0–12.0)
NEUTROS PCT: 58.1 % (ref 43.0–77.0)
Neutro Abs: 2.3 10*3/uL (ref 1.4–7.7)
Platelets: 204 10*3/uL (ref 150.0–400.0)
RBC: 5.13 Mil/uL (ref 4.22–5.81)
RDW: 13.8 % (ref 11.5–14.6)
WBC: 3.9 10*3/uL — AB (ref 4.5–10.5)

## 2014-02-09 LAB — BASIC METABOLIC PANEL
BUN: 12 mg/dL (ref 6–23)
CO2: 27 mEq/L (ref 19–32)
Calcium: 9.5 mg/dL (ref 8.4–10.5)
Chloride: 103 mEq/L (ref 96–112)
Creatinine, Ser: 0.8 mg/dL (ref 0.4–1.5)
GFR: 132.51 mL/min (ref >60.00)
GLUCOSE: 146 mg/dL — AB (ref 70–99)
POTASSIUM: 4.3 meq/L (ref 3.5–5.1)
Sodium: 138 mEq/L (ref 135–145)

## 2014-02-09 LAB — HEPATIC FUNCTION PANEL
ALT: 21 U/L (ref 0–53)
AST: 17 U/L (ref 0–37)
Albumin: 3.7 g/dL (ref 3.5–5.2)
Alkaline Phosphatase: 68 U/L (ref 39–117)
Bilirubin, Direct: 0.1 mg/dL (ref 0.0–0.3)
Total Bilirubin: 0.6 mg/dL (ref 0.3–1.2)
Total Protein: 6.8 g/dL (ref 6.0–8.3)

## 2014-02-09 LAB — LIPID PANEL
Cholesterol: 175 mg/dL (ref 0–200)
HDL: 46.7 mg/dL (ref >39.00)
LDL Cholesterol: 111 mg/dL — ABNORMAL HIGH (ref 0–99)
Total CHOL/HDL Ratio: 4
Triglycerides: 88 mg/dL (ref 0.0–149.0)
VLDL: 17.6 mg/dL (ref 0.0–40.0)

## 2014-02-09 LAB — HEMOGLOBIN A1C: HEMOGLOBIN A1C: 7.1 % — AB (ref 4.6–6.5)

## 2014-02-09 LAB — TSH: TSH: 1.29 u[IU]/mL (ref 0.35–5.50)

## 2014-02-09 LAB — PSA: PSA: 1.53 ng/mL (ref 0.10–4.00)

## 2014-02-10 ENCOUNTER — Encounter: Payer: Self-pay | Admitting: *Deleted

## 2014-02-10 ENCOUNTER — Encounter: Payer: Self-pay | Admitting: Internal Medicine

## 2014-02-10 ENCOUNTER — Ambulatory Visit (INDEPENDENT_AMBULATORY_CARE_PROVIDER_SITE_OTHER): Payer: 59 | Admitting: Internal Medicine

## 2014-02-10 VITALS — BP 134/90 | HR 75 | Temp 98.4°F | Ht 74.0 in | Wt 259.0 lb

## 2014-02-10 DIAGNOSIS — E119 Type 2 diabetes mellitus without complications: Secondary | ICD-10-CM

## 2014-02-10 DIAGNOSIS — Z0000 Encounter for general adult medical examination without abnormal findings: Secondary | ICD-10-CM

## 2014-02-10 DIAGNOSIS — E785 Hyperlipidemia, unspecified: Secondary | ICD-10-CM

## 2014-02-10 NOTE — Patient Instructions (Signed)
Please continue all other medications as before, and refills have been done if requested.  Please have the pharmacy call with any other refills you may need.  Please continue your efforts at being more active, low cholesterol diet, and weight control.  You are otherwise up to date with prevention measures today.  Please keep your appointments with your specialists as you may have planned  Please return in 6 months, or sooner if needed, with Lab testing done 3-5 days before  

## 2014-02-10 NOTE — Progress Notes (Signed)
Pre visit review using our clinic review tool, if applicable. No additional management support is needed unless otherwise documented below in the visit note. 

## 2014-02-10 NOTE — Assessment & Plan Note (Signed)
stable overall by history and exam, recent data reviewed with pt, and pt to continue medical treatment as before,  to f/u any worsening symptoms or concerns Lab Results  Component Value Date   HGBA1C 7.1* 02/09/2014

## 2014-02-10 NOTE — Assessment & Plan Note (Signed)

## 2014-02-10 NOTE — Assessment & Plan Note (Signed)
stable overall by history and exam, recent data reviewed with pt, and pt to continue medical treatment as before,  to f/u any worsening symptoms or concerns Lab Results  Component Value Date   LDLCALC 111* 02/09/2014   Goal ldl < 70, declines change in statin, for better diet, wt loss

## 2014-02-10 NOTE — Progress Notes (Signed)
Subjective:    Patient ID: Howard [name], male    DOB: 01-Feb-1961, 53 y.o.   MRN: [id number]  HPI  Here for wellness and f/u;  Overall doing ok;  Pt denies CP, worsening SOB, DOE, wheezing, orthopnea, PND, worsening LE edema, palpitations, dizziness or syncope.  Pt denies neurological change such as new headache, facial or extremity weakness.  Pt denies polydipsia, polyuria, or low sugar symptoms. Pt states overall good compliance with treatment and medications, good tolerability, and has been trying to follow lower cholesterol diet.  Pt denies worsening depressive symptoms, suicidal ideation or panic. No fever, night sweats, wt loss, loss of appetite, or other constitutional symptoms.  Pt states good ability with ADL's, has low fall risk, home safety reviewed and adequate, no other significant changes in hearing or vision, and only occasionally active with exercise.  No current complaints Past Medical History  Diagnosis Date  . Diabetes mellitus   . PSA, INCREASED 05/11/2009    Qualifier: Diagnosis of  By: Ronnald Ramp MD, Arvid Right.   . HYPERTENSION 02/06/2008    Qualifier: Diagnosis of  By: Alain Marion MD, Evie Lacks   . HYPERLIPIDEMIA 10/06/2007    Qualifier: Diagnosis of  By: Alain Marion MD, Evie Lacks   . DIABETES MELLITUS, TYPE II 05/24/2007    Qualifier: Diagnosis of  By: Dance CMA ([place]), Kim    . Lumbar disc disease     s/p surgury approx 1992  . Allergic rhinitis, cause unspecified 01/15/2012   Past Surgical History  Procedure Laterality Date  . Back surgery      Lumbar/Ruptured Disk  . Hernia repair  may 2012    left inguinal  . Knee arthroscopy    . Nasal septum surgery      reports that he has never smoked. He has never used smokeless tobacco. He reports that he drinks alcohol. He reports that he does not use illicit drugs. family history includes Cancer in his mother; Diabetes in his father and mother; Hypertension in his father and mother. There is no history of Colon cancer. No Known  Allergies Current Outpatient Prescriptions on File Prior to Visit  Medication Sig Dispense Refill  . fexofenadine (ALLEGRA) 180 MG tablet Take 1 tablet (180 mg total) by mouth daily.  90 tablet  3  . glucose blood (ONE TOUCH ULTRA TEST) test strip Use as instructed once daily. Diagnosis 250.0  100 each  12  . Lancets MISC 1 application by Does not apply route daily.  100 each  12  . lisinopril (PRINIVIL,ZESTRIL) 5 MG tablet TAKE 1 TABLET EVERY DAY  90 tablet  3  . metFORMIN (GLUCOPHAGE-XR) 500 MG 24 hr tablet 4 tabs by mouth per day  360 tablet  3  . pravastatin (PRAVACHOL) 40 MG tablet Take 1 tablet (40 mg total) by mouth daily.  90 tablet  3   No current facility-administered medications on file prior to visit.   Review of Systems Constitutional: Negative for increased diaphoresis, other activity, appetite or other siginficant weight change  HENT: Negative for worsening hearing loss, ear pain, facial swelling, mouth sores and neck stiffness.   Eyes: Negative for other worsening pain, redness or visual disturbance.  Respiratory: Negative for shortness of breath and wheezing.   Cardiovascular: Negative for chest pain and palpitations.  Gastrointestinal: Negative for diarrhea, blood in stool, abdominal distention or other pain Genitourinary: Negative for hematuria, flank pain or change in urine volume.  Musculoskeletal: Negative for myalgias or other joint complaints.  Skin:  Negative for color change and wound.  Neurological: Negative for syncope and numbness. other than noted Hematological: Negative for adenopathy. or other swelling Psychiatric/Behavioral: Negative for hallucinations, self-injury, decreased concentration or other worsening agitation.      Objective:   Physical Exam BP 134/90  Pulse 75  Temp(Src) 98.4 F (36.9 C) (Oral)  Ht 6\' 2"  (1.88 m)  Wt 259 lb (117.482 kg)  BMI 33.24 kg/m2  SpO2 93% VS noted,  Constitutional: Pt is oriented to person, place, and time.  Appears well-developed and well-nourished.  Head: Normocephalic and atraumatic.  Right Ear: External ear normal.  Left Ear: External ear normal.  Nose: Nose normal.  Mouth/Throat: Oropharynx is clear and moist.  Eyes: Conjunctivae and EOM are normal. Pupils are equal, round, and reactive to light.  Neck: Normal range of motion. Neck supple. No JVD present. No tracheal deviation present.  Cardiovascular: Normal rate, regular rhythm, normal heart sounds and intact distal pulses.   Pulmonary/Chest: Effort normal and breath sounds without rales or wheezing  Abdominal: Soft. Bowel sounds are normal. NT. No HSM  Musculoskeletal: Normal range of motion. Exhibits no edema.  Lymphadenopathy:  Has no cervical adenopathy.  Neurological: Pt is alert and oriented to person, place, and time. Pt has normal reflexes. No cranial nerve deficit. Motor grossly intact Skin: Skin is warm and dry. No rash noted.  Psychiatric:  Has normal mood and affect. Behavior is normal.     Assessment & Plan:

## 2014-02-24 ENCOUNTER — Other Ambulatory Visit: Payer: Self-pay | Admitting: Internal Medicine

## 2014-03-01 ENCOUNTER — Other Ambulatory Visit: Payer: Self-pay | Admitting: Internal Medicine

## 2014-07-31 ENCOUNTER — Other Ambulatory Visit: Payer: Self-pay | Admitting: Internal Medicine

## 2014-08-14 ENCOUNTER — Other Ambulatory Visit (INDEPENDENT_AMBULATORY_CARE_PROVIDER_SITE_OTHER): Payer: 59

## 2014-08-14 DIAGNOSIS — E119 Type 2 diabetes mellitus without complications: Secondary | ICD-10-CM

## 2014-08-14 LAB — LIPID PANEL
CHOL/HDL RATIO: 4
Cholesterol: 160 mg/dL (ref 0–200)
HDL: 40.8 mg/dL (ref >39.00)
LDL Cholesterol: 95 mg/dL (ref 0–99)
NonHDL: 119.2
Triglycerides: 121 mg/dL (ref 0.0–149.0)
VLDL: 24.2 mg/dL (ref 0.0–40.0)

## 2014-08-14 LAB — HEPATIC FUNCTION PANEL
ALBUMIN: 3.4 g/dL — AB (ref 3.5–5.2)
ALT: 18 U/L (ref 0–53)
AST: 20 U/L (ref 0–37)
Alkaline Phosphatase: 66 U/L (ref 39–117)
Bilirubin, Direct: 0.1 mg/dL (ref 0.0–0.3)
TOTAL PROTEIN: 7 g/dL (ref 6.0–8.3)
Total Bilirubin: 0.5 mg/dL (ref 0.2–1.2)

## 2014-08-14 LAB — BASIC METABOLIC PANEL
BUN: 10 mg/dL (ref 6–23)
CALCIUM: 9 mg/dL (ref 8.4–10.5)
CO2: 28 meq/L (ref 19–32)
Chloride: 104 mEq/L (ref 96–112)
Creatinine, Ser: 0.9 mg/dL (ref 0.4–1.5)
GFR: 110.93 mL/min (ref >60.00)
GLUCOSE: 122 mg/dL — AB (ref 70–99)
Potassium: 4.1 mEq/L (ref 3.5–5.1)
Sodium: 139 mEq/L (ref 135–145)

## 2014-08-14 LAB — HEMOGLOBIN A1C: Hgb A1c MFr Bld: 7.1 % — ABNORMAL HIGH (ref 4.6–6.5)

## 2014-08-15 ENCOUNTER — Ambulatory Visit (INDEPENDENT_AMBULATORY_CARE_PROVIDER_SITE_OTHER): Payer: 59 | Admitting: Internal Medicine

## 2014-08-15 ENCOUNTER — Encounter: Payer: Self-pay | Admitting: Internal Medicine

## 2014-08-15 VITALS — BP 140/92 | HR 67 | Temp 98.2°F | Ht 74.0 in | Wt 257.1 lb

## 2014-08-15 DIAGNOSIS — E785 Hyperlipidemia, unspecified: Secondary | ICD-10-CM

## 2014-08-15 DIAGNOSIS — Z0189 Encounter for other specified special examinations: Secondary | ICD-10-CM

## 2014-08-15 DIAGNOSIS — E1110 Type 2 diabetes mellitus with ketoacidosis without coma: Secondary | ICD-10-CM

## 2014-08-15 DIAGNOSIS — I10 Essential (primary) hypertension: Secondary | ICD-10-CM

## 2014-08-15 DIAGNOSIS — Z0000 Encounter for general adult medical examination without abnormal findings: Secondary | ICD-10-CM

## 2014-08-15 DIAGNOSIS — E1310 Other specified diabetes mellitus with ketoacidosis without coma: Secondary | ICD-10-CM

## 2014-08-15 NOTE — Progress Notes (Signed)
Subjective:    Patient ID: Howard [name], male    DOB: 09-24-1961, 53 y.o.   MRN: [id number]  HPI  Here for wellness and f/u;  Overall doing ok;  Pt denies CP, worsening SOB, DOE, wheezing, orthopnea, PND, worsening LE edema, palpitations, dizziness or syncope.  Pt denies neurological change such as new headache, facial or extremity weakness.  Pt denies polydipsia, polyuria, or low sugar symptoms. Pt states overall good compliance with treatment and medications, good tolerability, and has been trying to follow lower cholesterol diet.  Pt denies worsening depressive symptoms, suicidal ideation or panic. No fever, night sweats, wt loss, loss of appetite, or other constitutional symptoms.  Pt states good ability with ADL's, has low fall risk, home safety reviewed and adequate, no other significant changes in hearing or vision, and only occasionally active with exercise, plans to try to do better with diet and exercise soon, declines med changes today Past Medical History  Diagnosis Date  . Diabetes mellitus   . PSA, INCREASED 05/11/2009    Qualifier: Diagnosis of  By: Ronnald Ramp MD, Arvid Right.   . HYPERTENSION 02/06/2008    Qualifier: Diagnosis of  By: Alain Marion MD, Evie Lacks   . HYPERLIPIDEMIA 10/06/2007    Qualifier: Diagnosis of  By: Alain Marion MD, Evie Lacks   . DIABETES MELLITUS, TYPE II 05/24/2007    Qualifier: Diagnosis of  By: Dance CMA ([place]), Kim    . Lumbar disc disease     s/p surgury approx 1992  . Allergic rhinitis, cause unspecified 01/15/2012   Past Surgical History  Procedure Laterality Date  . Back surgery      Lumbar/Ruptured Disk  . Hernia repair  may 2012    left inguinal  . Knee arthroscopy    . Nasal septum surgery      reports that he has never smoked. He has never used smokeless tobacco. He reports that he drinks alcohol. He reports that he does not use illicit drugs. family history includes Cancer in his mother; Diabetes in his father and mother; Hypertension in his father  and mother. There is no history of Colon cancer. No Known Allergies Current Outpatient Prescriptions on File Prior to Visit  Medication Sig Dispense Refill  . glucose blood (ONE TOUCH ULTRA TEST) test strip Use as instructed once daily. Diagnosis 250.0  100 each  12  . Lancets MISC 1 application by Does not apply route daily.  100 each  12  . lisinopril (PRINIVIL,ZESTRIL) 5 MG tablet TAKE ONE TABLET BY MOUTH ONCE DAILY  90 tablet  3  . metFORMIN (GLUCOPHAGE-XR) 500 MG 24 hr tablet TAKE FOUR TABLETS BY MOUTH ONCE DAILY  360 tablet  3  . pravastatin (PRAVACHOL) 40 MG tablet TAKE ONE TABLET BY MOUTH ONCE DAILY  90 tablet  1  . fexofenadine (ALLEGRA) 180 MG tablet Take 1 tablet (180 mg total) by mouth daily.  90 tablet  3   No current facility-administered medications on file prior to visit.   Review of Systems  Constitutional: Negative for unusual diaphoresis or other sweats  HENT: Negative for ringing in ear Eyes: Negative for double vision or worsening visual disturbance.  Respiratory: Negative for choking and stridor.   Gastrointestinal: Negative for vomiting or other signifcant bowel change Genitourinary: Negative for hematuria or decreased urine volume.  Musculoskeletal: Negative for other MSK pain or swelling Skin: Negative for color change and worsening wound.  Neurological: Negative for tremors and numbness other than noted  Psychiatric/Behavioral: Negative for  decreased concentration or agitation other than above       Objective:   Physical Exam BP 140/92  Pulse 67  Temp(Src) 98.2 F (36.8 C) (Oral)  Ht 6\' 2"  (1.88 m)  Wt 257 lb 2 oz (116.631 kg)  BMI 33.00 kg/m2  SpO2 98% VS noted,  Constitutional: Pt appears well-developed, well-nourished.  HENT: Head: NCAT.  Right Ear: External ear normal.  Left Ear: External ear normal.  Eyes: . Pupils are equal, round, and reactive to light. Conjunctivae and EOM are normal Neck: Normal range of motion. Neck supple.    Cardiovascular: Normal rate and regular rhythm.   Pulmonary/Chest: Effort normal and breath sounds normal.  Abd:  Soft, NT, ND, + BS Neurological: Pt is alert. Not confused , motor grossly intact Skin: Skin is warm. No rash Psychiatric: Pt behavior is normal. No agitation.      Assessment & Plan:

## 2014-08-15 NOTE — Patient Instructions (Signed)
Please continue all other medications as before, and refills have been done if requested.  Please have the pharmacy call with any other refills you may need.  Please continue your efforts at being more active, low cholesterol diet, and weight control.  You are otherwise up to date with prevention measures today.  Please keep your appointments with your specialists as you may have planned  Please return in 6 months, or sooner if needed, with Lab testing done 3-5 days before  

## 2014-08-15 NOTE — Progress Notes (Signed)
Pre visit review using our clinic review tool, if applicable. No additional management support is needed unless otherwise documented below in the visit note. 

## 2014-08-16 ENCOUNTER — Telehealth: Payer: Self-pay | Admitting: Internal Medicine

## 2014-08-16 NOTE — Telephone Encounter (Signed)
emmi emailed °

## 2014-08-18 ENCOUNTER — Ambulatory Visit: Payer: 59 | Admitting: Internal Medicine

## 2014-12-12 ENCOUNTER — Encounter: Payer: Self-pay | Admitting: Internal Medicine

## 2014-12-12 ENCOUNTER — Ambulatory Visit (INDEPENDENT_AMBULATORY_CARE_PROVIDER_SITE_OTHER): Payer: BLUE CROSS/BLUE SHIELD | Admitting: Internal Medicine

## 2014-12-12 VITALS — BP 158/98 | HR 72 | Temp 98.7°F | Resp 18 | Ht 74.0 in | Wt 260.0 lb

## 2014-12-12 DIAGNOSIS — E1310 Other specified diabetes mellitus with ketoacidosis without coma: Secondary | ICD-10-CM

## 2014-12-12 DIAGNOSIS — I10 Essential (primary) hypertension: Secondary | ICD-10-CM

## 2014-12-12 DIAGNOSIS — M545 Low back pain, unspecified: Secondary | ICD-10-CM

## 2014-12-12 DIAGNOSIS — E1110 Type 2 diabetes mellitus with ketoacidosis without coma: Secondary | ICD-10-CM

## 2014-12-12 MED ORDER — CYCLOBENZAPRINE HCL 5 MG PO TABS: 5.0000 mg | ORAL_TABLET | Freq: Three times a day (TID) | ORAL | Status: DC | PRN

## 2014-12-12 MED ORDER — NAPROXEN 500 MG PO TABS: 500.0000 mg | ORAL_TABLET | Freq: Two times a day (BID) | ORAL | Status: DC

## 2014-12-12 NOTE — Patient Instructions (Signed)
Please take all new medication as prescribed - the anti-inflammatory, and muscle relaxer  Please continue all other medications as before, and refills have been done if requested.  Please have the pharmacy call with any other refills you may need.  Please keep your appointments with your specialists as you may have planned

## 2014-12-12 NOTE — Progress Notes (Signed)
Subjective:    Patient ID: Howard [name], male    DOB: 07-23-1961, 54 y.o.   MRN: [id number]  HPI  Here to f/u; overall doing ok,  Pt denies chest pain, increased sob or doe, wheezing, orthopnea, PND, increased LE swelling, palpitations, dizziness or syncope.  Pt denies polydipsia, polyuria, or low sugar symptoms such as weakness or confusion improved with po intake.  Pt denies new neurological symptoms such as new headache, or facial or extremity weakness or numbness.   Pt states overall good compliance with meds, has been trying to follow lower cholesterol, diabetic diet, with wt overall stable,  but little exercise however., recently with 3-4 days onset bilat lower back pain, worse on the right, mild to mod, burning, worse to stand up or bend, not better with anything. No recent falls and no bowel or bladder change, fever, wt loss,  worsening LE pain/numbness/weakness, gait change or falls. Past Medical History  Diagnosis Date  . Diabetes mellitus   . PSA, INCREASED 05/11/2009    Qualifier: Diagnosis of  By: Ronnald Ramp MD, Arvid Right.   . HYPERTENSION 02/06/2008    Qualifier: Diagnosis of  By: Alain Marion MD, Evie Lacks   . HYPERLIPIDEMIA 10/06/2007    Qualifier: Diagnosis of  By: Alain Marion MD, Evie Lacks   . DIABETES MELLITUS, TYPE II 05/24/2007    Qualifier: Diagnosis of  By: Dance CMA ([place]), Kim    . Lumbar disc disease     s/p surgury approx 1992  . Allergic rhinitis, cause unspecified 01/15/2012   Past Surgical History  Procedure Laterality Date  . Back surgery      Lumbar/Ruptured Disk  . Hernia repair  may 2012    left inguinal  . Knee arthroscopy    . Nasal septum surgery      reports that he has never smoked. He has never used smokeless tobacco. He reports that he drinks alcohol. He reports that he does not use illicit drugs. family history includes Cancer in his mother; Diabetes in his father and mother; Hypertension in his father and mother. There is no history of Colon cancer. No  Known Allergies Current Outpatient Prescriptions on File Prior to Visit  Medication Sig Dispense Refill  . glucose blood (ONE TOUCH ULTRA TEST) test strip Use as instructed once daily. Diagnosis 250.0 100 each 12  . Lancets MISC 1 application by Does not apply route daily. 100 each 12  . lisinopril (PRINIVIL,ZESTRIL) 5 MG tablet TAKE ONE TABLET BY MOUTH ONCE DAILY 90 tablet 3  . metFORMIN (GLUCOPHAGE-XR) 500 MG 24 hr tablet TAKE FOUR TABLETS BY MOUTH ONCE DAILY 360 tablet 3  . pravastatin (PRAVACHOL) 40 MG tablet TAKE ONE TABLET BY MOUTH ONCE DAILY 90 tablet 1  . fexofenadine (ALLEGRA) 180 MG tablet Take 1 tablet (180 mg total) by mouth daily. 90 tablet 3   No current facility-administered medications on file prior to visit.   Review of Systems  Constitutional: Negative for unusual diaphoresis or other sweats  HENT: Negative for ringing in ear Eyes: Negative for double vision or worsening visual disturbance.  Respiratory: Negative for choking and stridor.   Gastrointestinal: Negative for vomiting or other signifcant bowel change Genitourinary: Negative for hematuria or decreased urine volume.  Musculoskeletal: Negative for other MSK pain or swelling Skin: Negative for color change and worsening wound.  Neurological: Negative for tremors and numbness other than noted  Psychiatric/Behavioral: Negative for decreased concentration or agitation other than above       Objective:  Physical Exam BP 158/98 mmHg  Pulse 72  Temp(Src) 98.7 F (37.1 C) (Oral)  Resp 18  Ht 6\' 2"  (1.88 m)  Wt 260 lb 0.6 oz (117.953 kg)  BMI 33.37 kg/m2  SpO2 95% VS noted,  Constitutional: Pt appears well-developed, well-nourished.  HENT: Head: NCAT.  Right Ear: External ear normal.  Left Ear: External ear normal.  Eyes: . Pupils are equal, round, and reactive to light. Conjunctivae and EOM are normal Neck: Normal range of motion. Neck supple.  Cardiovascular: Normal rate and regular rhythm.     Pulmonary/Chest: Effort normal and breath sounds without rales or wheezing.  Abd:  Soft, NT, ND, + BS Neurological: Pt is alert. Not confused , motor grossly intact Skin: Skin is warm. No rash Psychiatric: Pt behavior is normal. No agitation.  Spine nontender, has mild right > left paravertebral tender spasm, no swelling or raash    Assessment & Plan:

## 2014-12-22 NOTE — Assessment & Plan Note (Signed)
stable overall by history and exam, recent data reviewed with pt, and pt to continue medical treatment as before,  to f/u any worsening symptoms or concerns Lab Results  Component Value Date   HGBA1C 7.1* 08/14/2014

## 2014-12-22 NOTE — Assessment & Plan Note (Signed)
Mild elev today likely related to pain, stable overall by history and exam, recent data reviewed with pt, and pt to continue medical treatment as before,  to f/u any worsening symptoms or concerns BP Readings from Last 3 Encounters:  12/12/14 158/98  08/15/14 140/92  02/10/14 134/90

## 2014-12-22 NOTE — Assessment & Plan Note (Signed)
C/w msk strain vs underlying lumbar djd/ddd, for muscle relaxer, nsaid prn, tylenol prn,  to f/u any worsening symptoms or concerns

## 2015-02-09 ENCOUNTER — Other Ambulatory Visit (INDEPENDENT_AMBULATORY_CARE_PROVIDER_SITE_OTHER): Payer: BLUE CROSS/BLUE SHIELD

## 2015-02-09 DIAGNOSIS — Z0189 Encounter for other specified special examinations: Secondary | ICD-10-CM | POA: Diagnosis not present

## 2015-02-09 DIAGNOSIS — E1310 Other specified diabetes mellitus with ketoacidosis without coma: Secondary | ICD-10-CM | POA: Diagnosis not present

## 2015-02-09 DIAGNOSIS — Z0000 Encounter for general adult medical examination without abnormal findings: Secondary | ICD-10-CM

## 2015-02-09 DIAGNOSIS — E1110 Type 2 diabetes mellitus with ketoacidosis without coma: Secondary | ICD-10-CM

## 2015-02-09 LAB — CBC WITH DIFFERENTIAL/PLATELET
Basophils Absolute: 0 10*3/uL (ref 0.0–0.1)
Basophils Relative: 0.8 % (ref 0.0–3.0)
Eosinophils Absolute: 0.1 10*3/uL (ref 0.0–0.7)
Eosinophils Relative: 1.9 % (ref 0.0–5.0)
HCT: 40.8 % (ref 39.0–52.0)
Hemoglobin: 13.7 g/dL (ref 13.0–17.0)
Lymphocytes Relative: 35.4 % (ref 12.0–46.0)
Lymphs Abs: 1.4 10*3/uL (ref 0.7–4.0)
MCHC: 33.5 g/dL (ref 30.0–36.0)
MCV: 82.4 fl (ref 78.0–100.0)
Monocytes Absolute: 0.5 10*3/uL (ref 0.1–1.0)
Monocytes Relative: 12 % (ref 3.0–12.0)
Neutro Abs: 2 10*3/uL (ref 1.4–7.7)
Neutrophils Relative %: 49.9 % (ref 43.0–77.0)
Platelets: 223 10*3/uL (ref 150.0–400.0)
RBC: 4.96 Mil/uL (ref 4.22–5.81)
RDW: 14 % (ref 11.5–15.5)
WBC: 3.9 10*3/uL — ABNORMAL LOW (ref 4.0–10.5)

## 2015-02-09 LAB — HEPATIC FUNCTION PANEL
ALK PHOS: 75 U/L (ref 39–117)
ALT: 21 U/L (ref 0–53)
AST: 21 U/L (ref 0–37)
Albumin: 4.2 g/dL (ref 3.5–5.2)
BILIRUBIN DIRECT: 0.1 mg/dL (ref 0.0–0.3)
BILIRUBIN TOTAL: 0.4 mg/dL (ref 0.2–1.2)
TOTAL PROTEIN: 6.9 g/dL (ref 6.0–8.3)

## 2015-02-09 LAB — URINALYSIS, ROUTINE W REFLEX MICROSCOPIC
Bilirubin Urine: NEGATIVE
Hgb urine dipstick: NEGATIVE
Ketones, ur: NEGATIVE
Leukocytes, UA: NEGATIVE
Nitrite: NEGATIVE
TOTAL PROTEIN, URINE-UPE24: NEGATIVE
Urine Glucose: NEGATIVE
Urobilinogen, UA: 0.2 (ref 0.0–1.0)
pH: 5.5 (ref 5.0–8.0)

## 2015-02-09 LAB — LIPID PANEL
CHOL/HDL RATIO: 4
Cholesterol: 165 mg/dL (ref 0–200)
HDL: 45.9 mg/dL (ref >39.00)
LDL Cholesterol: 108 mg/dL — ABNORMAL HIGH (ref 0–99)
NonHDL: 119.1
TRIGLYCERIDES: 56 mg/dL (ref 0.0–149.0)
VLDL: 11.2 mg/dL (ref 0.0–40.0)

## 2015-02-09 LAB — HEMOGLOBIN A1C: HEMOGLOBIN A1C: 7.3 % — AB (ref 4.6–6.5)

## 2015-02-09 LAB — BASIC METABOLIC PANEL
BUN: 14 mg/dL (ref 6–23)
CALCIUM: 9.6 mg/dL (ref 8.4–10.5)
CO2: 29 meq/L (ref 19–32)
CREATININE: 1 mg/dL (ref 0.40–1.50)
Chloride: 106 mEq/L (ref 96–112)
GFR: 100.56 mL/min (ref >60.00)
Glucose, Bld: 128 mg/dL — ABNORMAL HIGH (ref 70–99)
Potassium: 4.3 mEq/L (ref 3.5–5.1)
SODIUM: 140 meq/L (ref 135–145)

## 2015-02-09 LAB — TSH: TSH: 1.5 u[IU]/mL (ref 0.35–4.50)

## 2015-02-09 LAB — MICROALBUMIN / CREATININE URINE RATIO
Creatinine,U: 287.9 mg/dL
Microalb Creat Ratio: 0.5 mg/g (ref 0.0–30.0)
Microalb, Ur: 1.3 mg/dL (ref 0.0–1.9)

## 2015-02-09 LAB — PSA: PSA: 1.64 ng/mL (ref 0.10–4.00)

## 2015-02-13 ENCOUNTER — Ambulatory Visit (INDEPENDENT_AMBULATORY_CARE_PROVIDER_SITE_OTHER): Payer: BLUE CROSS/BLUE SHIELD | Admitting: Internal Medicine

## 2015-02-13 ENCOUNTER — Encounter: Payer: Self-pay | Admitting: Internal Medicine

## 2015-02-13 VITALS — BP 132/84 | HR 60 | Temp 98.7°F | Resp 18 | Ht 74.0 in | Wt 258.0 lb

## 2015-02-13 DIAGNOSIS — Z0000 Encounter for general adult medical examination without abnormal findings: Secondary | ICD-10-CM

## 2015-02-13 DIAGNOSIS — E1310 Other specified diabetes mellitus with ketoacidosis without coma: Secondary | ICD-10-CM | POA: Diagnosis not present

## 2015-02-13 DIAGNOSIS — E1110 Type 2 diabetes mellitus with ketoacidosis without coma: Secondary | ICD-10-CM

## 2015-02-13 DIAGNOSIS — I10 Essential (primary) hypertension: Secondary | ICD-10-CM | POA: Diagnosis not present

## 2015-02-13 DIAGNOSIS — E785 Hyperlipidemia, unspecified: Secondary | ICD-10-CM

## 2015-02-13 MED ORDER — LISINOPRIL 10 MG PO TABS: 10.0000 mg | ORAL_TABLET | Freq: Every day | ORAL | Status: DC

## 2015-02-13 MED ORDER — LISINOPRIL 5 MG PO TABS: 5.0000 mg | ORAL_TABLET | Freq: Every day | ORAL | Status: DC

## 2015-02-13 NOTE — Assessment & Plan Note (Signed)
Mild uncontrolled, delcines further OHA, to cont metformin, work on better diet, exercise, wt loss, f/u labs Lab Results  Component Value Date   HGBA1C 7.3* 02/09/2015

## 2015-02-13 NOTE — Assessment & Plan Note (Signed)
Vining for increase lsinopril to 10 qd,  BP Readings from Last 3 Encounters:  02/13/15 132/84  12/12/14 158/98  08/15/14 140/92   O/w stable overall by history and exam, recent data reviewed with pt, and pt to continue medical treatment as before,  to f/u any worsening symptoms or concerns

## 2015-02-13 NOTE — Progress Notes (Signed)
Subjective:    Patient ID: Howard [name], male    DOB: [birth year], 54 y.o.   MRN: [id number]  HPI Here for wellness and f/u;  Overall doing ok;  Pt denies Chest pain, worsening SOB, DOE, wheezing, orthopnea, PND, worsening LE edema, palpitations, dizziness or syncope.  Pt denies neurological change such as new headache, facial or extremity weakness.  Pt denies polydipsia, polyuria, or low sugar symptoms. Pt states overall good compliance with treatment and medications, good tolerability, and has been trying to follow appropriate diet.  Pt denies worsening depressive symptoms, suicidal ideation or panic. No fever, night sweats, wt loss, loss of appetite, or other constitutional symptoms.  Pt states good ability with ADL's, has low fall risk, home safety reviewed and adequate, no other significant changes in hearing or vision, and only occasionally active with exercise.  Has had different work schedule and diet has been somewhat lax, and not taken the pravachol daily, but can do better he thinks with diet and med compliance. BP bettter today than usual, but hoping for better as well Past Medical History  Diagnosis Date  . Diabetes mellitus   . PSA, INCREASED 05/11/2009    Qualifier: Diagnosis of  By: Ronnald Ramp MD, Arvid Right.   . HYPERTENSION 02/06/2008    Qualifier: Diagnosis of  By: Alain Marion MD, Evie Lacks   . HYPERLIPIDEMIA 10/06/2007    Qualifier: Diagnosis of  By: Alain Marion MD, Evie Lacks   . DIABETES MELLITUS, TYPE II 05/24/2007    Qualifier: Diagnosis of  By: Dance CMA ([place]), Kim    . Lumbar disc disease     s/p surgury approx 1992  . Allergic rhinitis, cause unspecified 01/15/2012   Past Surgical History  Procedure Laterality Date  . Back surgery      Lumbar/Ruptured Disk  . Hernia repair  may 2012    left inguinal  . Knee arthroscopy    . Nasal septum surgery      reports that he has never smoked. He has never used smokeless tobacco. He reports that he drinks alcohol. He reports that he  does not use illicit drugs. family history includes Cancer in his mother; Diabetes in his father and mother; Hypertension in his father and mother. There is no history of Colon cancer. No Known Allergies Current Outpatient Prescriptions on File Prior to Visit  Medication Sig Dispense Refill  . cyclobenzaprine (FLEXERIL) 5 MG tablet Take 1 tablet (5 mg total) by mouth 3 (three) times daily as needed for muscle spasms. 60 tablet 1  . glucose blood (ONE TOUCH ULTRA TEST) test strip Use as instructed once daily. Diagnosis 250.0 100 each 12  . Lancets MISC 1 application by Does not apply route daily. 100 each 12  . metFORMIN (GLUCOPHAGE-XR) 500 MG 24 hr tablet TAKE FOUR TABLETS BY MOUTH ONCE DAILY 360 tablet 3  . naproxen (NAPROSYN) 500 MG tablet Take 1 tablet (500 mg total) by mouth 2 (two) times daily with a meal. 60 tablet 2  . pravastatin (PRAVACHOL) 40 MG tablet TAKE ONE TABLET BY MOUTH ONCE DAILY 90 tablet 1  . fexofenadine (ALLEGRA) 180 MG tablet Take 1 tablet (180 mg total) by mouth daily. 90 tablet 3   No current facility-administered medications on file prior to visit.   Review of Systems Constitutional: Negative for increased diaphoresis, other activity, appetite or siginficant weight change other than noted HENT: Negative for worsening hearing loss, ear pain, facial swelling, mouth sores and neck stiffness.   Eyes: Negative for  other worsening pain, redness or visual disturbance.  Respiratory: Negative for shortness of breath and wheezing  Cardiovascular: Negative for chest pain and palpitations.  Gastrointestinal: Negative for diarrhea, blood in stool, abdominal distention or other pain Genitourinary: Negative for hematuria, flank pain or change in urine volume.  Musculoskeletal: Negative for myalgias or other joint complaints.  Skin: Negative for color change and wound or drainage.  Neurological: Negative for syncope and numbness. other than noted Hematological: Negative for  adenopathy. or other swelling Psychiatric/Behavioral: Negative for hallucinations, SI, self-injury, decreased concentration or other worsening agitation.      Objective:   Physical Exam BP 132/84 mmHg  Pulse 60  Temp(Src) 98.7 F (37.1 C) (Oral)  Resp 18  Ht 6\' 2"  (1.88 m)  Wt 258 lb (117.028 kg)  BMI 33.11 kg/m2  SpO2 96% BP Readings from Last 3 Encounters:  02/13/15 132/84  12/12/14 158/98  08/15/14 140/92  VS noted,  Constitutional: Pt is oriented to person, place, and time. Appears well-developed and well-nourished, in no significant distress Head: Normocephalic and atraumatic.  Right Ear: External ear normal.  Left Ear: External ear normal.  Nose: Nose normal.  Mouth/Throat: Oropharynx is clear and moist.  Eyes: Conjunctivae and EOM are normal. Pupils are equal, round, and reactive to light.  Neck: Normal range of motion. Neck supple. No JVD present. No tracheal deviation present or significant neck LA or mass Cardiovascular: Normal rate, regular rhythm, normal heart sounds and intact distal pulses.   Pulmonary/Chest: Effort normal and breath sounds without rales or wheezing  Abdominal: Soft. Bowel sounds are normal. NT. No HSM  Musculoskeletal: Normal range of motion. Exhibits no edema.  Lymphadenopathy:  Has no cervical adenopathy.  Neurological: Pt is alert and oriented to person, place, and time. Pt has normal reflexes. No cranial nerve deficit. Motor grossly intact Skin: Skin is warm and dry. No rash noted.  Psychiatric:  Has normal mood and affect. Behavior is normal.      Assessment & Plan:

## 2015-02-13 NOTE — Assessment & Plan Note (Signed)
Goal ldl < 70, encouraged statin compliance, for f/u next visit Lab Results  Component Value Date   LDLCALC 108* 02/09/2015

## 2015-02-13 NOTE — Progress Notes (Signed)
Pre visit review using our clinic review tool, if applicable. No additional management support is needed unless otherwise documented below in the visit note. 

## 2015-02-13 NOTE — Patient Instructions (Signed)
OK to increase the lisinopril to 10 mg per day  Please continue all other medications as before, and refills have been done if requested.  Please have the pharmacy call with any other refills you may need.  Please continue your efforts at being more active, low cholesterol diet, and weight control.  You are otherwise up to date with prevention measures today.  Please keep your appointments with your specialists as you may have planned  Please return in 6 months, or sooner if needed, with Lab testing done 3-5 days before

## 2015-02-13 NOTE — Assessment & Plan Note (Signed)

## 2015-03-09 ENCOUNTER — Other Ambulatory Visit: Payer: Self-pay | Admitting: Internal Medicine

## 2015-06-26 ENCOUNTER — Other Ambulatory Visit: Payer: Self-pay

## 2015-06-26 MED ORDER — PRAVASTATIN SODIUM 40 MG PO TABS: 40.0000 mg | ORAL_TABLET | Freq: Every day | ORAL | Status: DC

## 2015-07-14 ENCOUNTER — Encounter: Payer: Self-pay | Admitting: Family Medicine

## 2015-07-14 ENCOUNTER — Ambulatory Visit (INDEPENDENT_AMBULATORY_CARE_PROVIDER_SITE_OTHER): Payer: BLUE CROSS/BLUE SHIELD | Admitting: Family Medicine

## 2015-07-14 VITALS — BP 140/82 | Temp 97.8°F | Wt 254.0 lb

## 2015-07-14 DIAGNOSIS — S86012A Strain of left Achilles tendon, initial encounter: Secondary | ICD-10-CM

## 2015-07-14 DIAGNOSIS — J069 Acute upper respiratory infection, unspecified: Secondary | ICD-10-CM | POA: Diagnosis not present

## 2015-07-14 NOTE — Patient Instructions (Signed)
Buy OTC generic robitussin DM and take as directed on the packaging.  Go to the Orthopedic urgent care office that I gave you phone # and address for.

## 2015-07-14 NOTE — Progress Notes (Signed)
OFFICE NOTE  07/14/2015  CC:  Chief Complaint  Patient presents with  . swollen foot     HPI: Patient is a 54 y.o. African-American male who is here for about 2 weeks of nasal congestion, scratchy throat, PND, cough.  Has been improving last few days.   No fevers.   No hx of asthma.  Nonsmoker.  Took allegra and alka seltzer cold plus lately, no nasal sprays.  Has noted some L ankle swelling last few days, says he "tweeked it" going down some steps.  He took occ dose of ibup and has been icing it.  When he is off of the foot the swelling goes down.  Initial injury did not result in any inability to bear wt or limp--pain and swelling gradually came later. He has a marked limp now, has considered using crutches.    Pertinent PMH:  PMH and PSH reviewed  MEDS:  Outpatient Prescriptions Prior to Visit  Medication Sig Dispense Refill  . glucose blood (ONE TOUCH ULTRA TEST) test strip Use as instructed once daily. Diagnosis 250.0 100 each 12  . Lancets MISC 1 application by Does not apply route daily. 100 each 12  . lisinopril (PRINIVIL,ZESTRIL) 10 MG tablet Take 1 tablet (10 mg total) by mouth daily. 90 tablet 3  . metFORMIN (GLUCOPHAGE-XR) 500 MG 24 hr tablet TAKE FOUR TABLETS BY MOUTH ONCE DAILY 360 tablet 3  . naproxen (NAPROSYN) 500 MG tablet Take 1 tablet (500 mg total) by mouth 2 (two) times daily with a meal. 60 tablet 2  . pravastatin (PRAVACHOL) 40 MG tablet Take 1 tablet (40 mg total) by mouth daily. 90 tablet 1  . cyclobenzaprine (FLEXERIL) 5 MG tablet Take 1 tablet (5 mg total) by mouth 3 (three) times daily as needed for muscle spasms. (Patient not taking: Reported on 07/14/2015) 60 tablet 1  . fexofenadine (ALLEGRA) 180 MG tablet Take 1 tablet (180 mg total) by mouth daily. 90 tablet 3   No facility-administered medications prior to visit.    PE: Blood pressure 140/82, temperature 97.8 F (36.6 C), weight 254 lb (115.214 kg). VS: noted--normal. Gen: alert, NAD, NONTOXIC  APPEARING. HEENT: eyes without injection, drainage, or swelling.  Ears: EACs clear, TMs with normal light reflex and landmarks.  Nose: Clear rhinorrhea, with some dried, crusty exudate adherent to mildly injected mucosa.  No purulent d/c.  No paranasal sinus TTP.  No facial swelling.  Throat and mouth without focal lesion.  No pharyngial swelling, erythema, or exudate.   Neck: supple, no LAD.   LUNGS: CTA bilat, nonlabored resps.   CV: RRR, no m/r/g. EXT: no c/c/e SKIN: no rash Left ankle: achilles tendon tenderness is significant and he has swelling in area of distal achilles tendon, esp over insertion site on calcaneus.  Mild medial and lateral ankle soft tissue swelling w/out tenderness.  Ankle ROM intact, including flexion and extension.  When asked to plantar flex L foot against resistance he can't do this due to pain in achilles tendon.  No ankle laxity with varus/valgus stress or with anterior drawer.  IMPRESSION AND PLAN:  1) Viral URI:  Trial of mucinex DM or robitussin DM otc as directed on the box. May use OTC nasal saline spray or irrigation solution bid. OTC nonsedating antihistamines prn discussed.  Decongestant use discussed--ok if tolerated in the past w/out side effect and if pt has no hx of HTN.  2) L achilles tendon strain, possible partial tear. His discomfort is such that I think  he needs further attention at an ortho specialist (may need imaging, may need soft LL walking boot--things we cannot offer here).  We called a local ortho urgent care office and they said to send him over now so we gave him the phone# and address and he left our office today to go there.  An After Visit Summary was printed and given to the patient.  FOLLOW UP: prn

## 2015-08-15 ENCOUNTER — Other Ambulatory Visit (INDEPENDENT_AMBULATORY_CARE_PROVIDER_SITE_OTHER): Payer: BLUE CROSS/BLUE SHIELD

## 2015-08-15 DIAGNOSIS — E1310 Other specified diabetes mellitus with ketoacidosis without coma: Secondary | ICD-10-CM

## 2015-08-15 DIAGNOSIS — E1110 Type 2 diabetes mellitus with ketoacidosis without coma: Secondary | ICD-10-CM

## 2015-08-15 LAB — LIPID PANEL
Cholesterol: 136 mg/dL (ref 0–200)
HDL: 43.5 mg/dL (ref >39.00)
LDL CALC: 77 mg/dL (ref 0–99)
NONHDL: 92.26
Total CHOL/HDL Ratio: 3
Triglycerides: 75 mg/dL (ref 0.0–149.0)
VLDL: 15 mg/dL (ref 0.0–40.0)

## 2015-08-15 LAB — HEPATIC FUNCTION PANEL
ALK PHOS: 71 U/L (ref 39–117)
ALT: 20 U/L (ref 0–53)
AST: 23 U/L (ref 0–37)
Albumin: 3.8 g/dL (ref 3.5–5.2)
BILIRUBIN DIRECT: 0.1 mg/dL (ref 0.0–0.3)
BILIRUBIN TOTAL: 0.4 mg/dL (ref 0.2–1.2)
Total Protein: 6.8 g/dL (ref 6.0–8.3)

## 2015-08-15 LAB — BASIC METABOLIC PANEL
BUN: 12 mg/dL (ref 6–23)
CO2: 29 meq/L (ref 19–32)
Calcium: 9.4 mg/dL (ref 8.4–10.5)
Chloride: 103 mEq/L (ref 96–112)
Creatinine, Ser: 0.99 mg/dL (ref 0.40–1.50)
GFR: 101.54 mL/min (ref >60.00)
Glucose, Bld: 160 mg/dL — ABNORMAL HIGH (ref 70–99)
Potassium: 4.1 mEq/L (ref 3.5–5.1)
Sodium: 140 mEq/L (ref 135–145)

## 2015-08-15 LAB — HEMOGLOBIN A1C: HEMOGLOBIN A1C: 6.9 % — AB (ref 4.6–6.5)

## 2015-08-16 ENCOUNTER — Ambulatory Visit (INDEPENDENT_AMBULATORY_CARE_PROVIDER_SITE_OTHER): Payer: 59 | Admitting: Internal Medicine

## 2015-08-16 ENCOUNTER — Encounter: Payer: Self-pay | Admitting: Internal Medicine

## 2015-08-16 VITALS — BP 146/88 | HR 66 | Temp 98.9°F | Ht 74.0 in | Wt 249.0 lb

## 2015-08-16 DIAGNOSIS — E119 Type 2 diabetes mellitus without complications: Secondary | ICD-10-CM

## 2015-08-16 DIAGNOSIS — E1310 Other specified diabetes mellitus with ketoacidosis without coma: Secondary | ICD-10-CM

## 2015-08-16 DIAGNOSIS — I10 Essential (primary) hypertension: Secondary | ICD-10-CM | POA: Diagnosis not present

## 2015-08-16 DIAGNOSIS — Z0000 Encounter for general adult medical examination without abnormal findings: Secondary | ICD-10-CM

## 2015-08-16 DIAGNOSIS — E785 Hyperlipidemia, unspecified: Secondary | ICD-10-CM | POA: Diagnosis not present

## 2015-08-16 DIAGNOSIS — Z0189 Encounter for other specified special examinations: Secondary | ICD-10-CM

## 2015-08-16 DIAGNOSIS — E1110 Type 2 diabetes mellitus with ketoacidosis without coma: Secondary | ICD-10-CM

## 2015-08-16 MED ORDER — GLUCOSE BLOOD VI STRP: ORAL_STRIP | Status: DC

## 2015-08-16 MED ORDER — ONETOUCH ULTRA 2 W/DEVICE KIT: PACK | Status: AC

## 2015-08-16 MED ORDER — LISINOPRIL 20 MG PO TABS: 20.0000 mg | ORAL_TABLET | Freq: Every day | ORAL | Status: DC

## 2015-08-16 NOTE — Progress Notes (Signed)
Subjective:    Patient ID: Howard [name], male    DOB: 1961-03-27, 54 y.o.   MRN: [id number]  HPI   Here to f/u; overall doing ok,  Pt denies chest pain, increasing sob or doe, wheezing, orthopnea, PND, increased LE swelling, palpitations, dizziness or syncope.  Pt denies new neurological symptoms such as new headache, or facial or extremity weakness or numbness.  Pt denies polydipsia, polyuria, or low sugar episode.   Pt denies new neurological symptoms such as new headache, or facial or extremity weakness or numbness.   Pt states overall good compliance with meds, mostly trying to follow appropriate diet, with wt overall stable,  but little exercise however. 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 Diabetes mellitus   . PSA, INCREASED 05/11/2009    Qualifier: Diagnosis of  By: Ronnald Ramp MD, Arvid Right.   . HYPERTENSION 02/06/2008    Qualifier: Diagnosis of  By: Alain Marion MD, Evie Lacks   . HYPERLIPIDEMIA 10/06/2007    Qualifier: Diagnosis of  By: Alain Marion MD, Evie Lacks   . DIABETES MELLITUS, TYPE II 05/24/2007    Qualifier: Diagnosis of  By: Dance CMA ([place]), Kim    . Lumbar disc disease     s/p surgury approx 1992  . Allergic rhinitis, cause unspecified 01/15/2012   Past Surgical History  Procedure Laterality Date  . Back surgery      Lumbar/Ruptured Disk  . Hernia repair  may 2012    left inguinal  . Knee arthroscopy    . Nasal septum surgery      reports that he has never smoked. He has never used smokeless tobacco. He reports that he drinks alcohol. He reports that he does not use illicit drugs. family history includes Cancer in his mother; Diabetes in his father and mother; Hypertension in his father and mother. There is no history of Colon cancer. No Known Allergies Current Outpatient  Prescriptions on File Prior to Visit  Medication Sig Dispense Refill  . cyclobenzaprine (FLEXERIL) 5 MG tablet Take 1 tablet (5 mg total) by mouth 3 (three) times daily as needed for muscle spasms. 60 tablet 1  . glucose blood (ONE TOUCH ULTRA TEST) test strip Use as instructed once daily. Diagnosis 250.0 100 each 12  . Lancets MISC 1 application by Does not apply route daily. 100 each 12  . lisinopril (PRINIVIL,ZESTRIL) 10 MG tablet Take 1 tablet (10 mg total) by mouth daily. 90 tablet 3  . metFORMIN (GLUCOPHAGE-XR) 500 MG 24 hr tablet TAKE FOUR TABLETS BY MOUTH ONCE DAILY 360 tablet 3  . naproxen (NAPROSYN) 500 MG tablet Take 1 tablet (500 mg total) by mouth 2 (two) times daily with a meal. 60 tablet 2  . pravastatin (PRAVACHOL) 40 MG tablet Take 1 tablet (40 mg total) by mouth daily. 90 tablet 1  . fexofenadine (ALLEGRA) 180 MG tablet Take 1 tablet (180 mg total) by mouth daily. 90 tablet 3   No current facility-administered medications on file prior to visit.     Review of Systems  Constitutional: Negative for unusual diaphoresis or night sweats HENT: Negative for ringing in ear or discharge Eyes: Negative for double vision or worsening visual disturbance.  Respiratory: Negative for choking and stridor.   Gastrointestinal: Negative for vomiting or other signifcant  bowel change Genitourinary: Negative for hematuria or change in urine volume.  Musculoskeletal: Negative for other MSK pain or swelling Skin: Negative for color change and worsening wound.  Neurological: Negative for tremors and numbness other than noted  Psychiatric/Behavioral: Negative for decreased concentration or agitation other than above       Objective:   Physical Exam BP 146/88 mmHg  Pulse 66  Temp(Src) 98.9 F (37.2 C) (Oral)  Ht 6\' 2"  (1.88 m)  Wt 249 lb (112.946 kg)  BMI 31.96 kg/m2  SpO2 97% VS noted,  Constitutional: Pt appears in no significant distress HENT: Head: NCAT.  Right Ear: External ear  normal.  Left Ear: External ear normal.  Eyes: . Pupils are equal, round, and reactive to light. Conjunctivae and EOM are normal Neck: Normal range of motion. Neck supple.  Cardiovascular: Normal rate and regular rhythm.   Pulmonary/Chest: Effort normal and breath sounds without rales or wheezing.  Abd:  Soft, NT, ND, + BS Neurological: Pt is alert. Not confused , motor grossly intact Skin: Skin is warm. No rash, no LE edema Psychiatric: Pt behavior is normal. No agitation.     Assessment & Plan:

## 2015-08-16 NOTE — Progress Notes (Signed)
Pre visit review using our clinic review tool, if applicable. No additional management support is needed unless otherwise documented below in the visit note. 

## 2015-08-16 NOTE — Assessment & Plan Note (Signed)
stable overall by history and exam, recent data reviewed with pt, and pt to continue medical treatment as before,  to f/u any worsening symptoms or concerns Lab Results  Component Value Date   LDLCALC 77 08/15/2015

## 2015-08-16 NOTE — Assessment & Plan Note (Signed)
Mild uncontrolled, for incrased lisinopril to 20 qd., cont to monitor BP at home and next visit

## 2015-08-16 NOTE — Patient Instructions (Signed)
OK to increase the lisinopril to 20 mg per day  Your glucometer and supplies were sent to the pharmacy  Please continue all other medications as before, and refills have been done if requested.  Please have the pharmacy call with any other refills you may need.  Please continue your efforts at being more active, low cholesterol diet, and weight control.  Please keep your appointments with your specialists as you may have planned  Please return in 6 months, or sooner if needed, with Lab testing done 3-5 days before

## 2015-08-16 NOTE — Assessment & Plan Note (Addendum)
stable overall by history and exam, recent data reviewed with pt, and pt to continue medical treatment as before,  to f/u any worsening symptoms or concerns Lab Results  Component Value Date   HGBA1C 6.9* 08/15/2015   For glucometer and supplies rx

## 2015-12-17 ENCOUNTER — Encounter: Payer: Self-pay | Admitting: Internal Medicine

## 2015-12-17 LAB — HM DIABETES EYE EXAM

## 2016-01-14 ENCOUNTER — Other Ambulatory Visit (INDEPENDENT_AMBULATORY_CARE_PROVIDER_SITE_OTHER): Payer: 59

## 2016-01-14 DIAGNOSIS — E119 Type 2 diabetes mellitus without complications: Secondary | ICD-10-CM

## 2016-01-14 DIAGNOSIS — Z0189 Encounter for other specified special examinations: Secondary | ICD-10-CM

## 2016-01-14 DIAGNOSIS — Z0000 Encounter for general adult medical examination without abnormal findings: Secondary | ICD-10-CM

## 2016-01-14 LAB — HEPATIC FUNCTION PANEL
ALK PHOS: 79 U/L (ref 39–117)
ALT: 19 U/L (ref 0–53)
AST: 20 U/L (ref 0–37)
Albumin: 3.8 g/dL (ref 3.5–5.2)
BILIRUBIN DIRECT: 0.1 mg/dL (ref 0.0–0.3)
TOTAL PROTEIN: 6.4 g/dL (ref 6.0–8.3)
Total Bilirubin: 0.4 mg/dL (ref 0.2–1.2)

## 2016-01-14 LAB — URINALYSIS, ROUTINE W REFLEX MICROSCOPIC
Bilirubin Urine: NEGATIVE
HGB URINE DIPSTICK: NEGATIVE
KETONES UR: NEGATIVE
Leukocytes, UA: NEGATIVE
Nitrite: NEGATIVE
RBC / HPF: NONE SEEN (ref 0–?)
Specific Gravity, Urine: >=1.03 — AB (ref 1.000–1.030)
Total Protein, Urine: NEGATIVE
URINE GLUCOSE: NEGATIVE
UROBILINOGEN UA: 0.2 (ref 0.0–1.0)
pH: 5.5 (ref 5.0–8.0)

## 2016-01-14 LAB — BASIC METABOLIC PANEL
BUN: 10 mg/dL (ref 6–23)
CALCIUM: 9.1 mg/dL (ref 8.4–10.5)
CHLORIDE: 105 meq/L (ref 96–112)
CO2: 29 meq/L (ref 19–32)
Creatinine, Ser: 0.91 mg/dL (ref 0.40–1.50)
GFR: 111.73 mL/min (ref >60.00)
Glucose, Bld: 159 mg/dL — ABNORMAL HIGH (ref 70–99)
Potassium: 4.4 mEq/L (ref 3.5–5.1)
SODIUM: 140 meq/L (ref 135–145)

## 2016-01-14 LAB — TSH: TSH: 2.24 u[IU]/mL (ref 0.35–4.50)

## 2016-01-14 LAB — CBC WITH DIFFERENTIAL/PLATELET
BASOS ABS: 0 10*3/uL (ref 0.0–0.1)
Basophils Relative: 0.8 % (ref 0.0–3.0)
Eosinophils Absolute: 0.1 10*3/uL (ref 0.0–0.7)
Eosinophils Relative: 2.3 % (ref 0.0–5.0)
HCT: 39.8 % (ref 39.0–52.0)
Hemoglobin: 13.2 g/dL (ref 13.0–17.0)
LYMPHS ABS: 1.4 10*3/uL (ref 0.7–4.0)
Lymphocytes Relative: 44.3 % (ref 12.0–46.0)
MCHC: 33.2 g/dL (ref 30.0–36.0)
MCV: 83 fl (ref 78.0–100.0)
MONOS PCT: 11.4 % (ref 3.0–12.0)
Monocytes Absolute: 0.4 10*3/uL (ref 0.1–1.0)
NEUTROS ABS: 1.3 10*3/uL — AB (ref 1.4–7.7)
NEUTROS PCT: 41.2 % — AB (ref 43.0–77.0)
PLATELETS: 188 10*3/uL (ref 150.0–400.0)
RBC: 4.8 Mil/uL (ref 4.22–5.81)
RDW: 14.2 % (ref 11.5–15.5)
WBC: 3.3 10*3/uL — ABNORMAL LOW (ref 4.0–10.5)

## 2016-01-14 LAB — LIPID PANEL
Cholesterol: 151 mg/dL (ref 0–200)
HDL: 38.2 mg/dL — ABNORMAL LOW (ref >39.00)
LDL Cholesterol: 99 mg/dL (ref 0–99)
NONHDL: 113.25
Total CHOL/HDL Ratio: 4
Triglycerides: 69 mg/dL (ref 0.0–149.0)
VLDL: 13.8 mg/dL (ref 0.0–40.0)

## 2016-01-14 LAB — MICROALBUMIN / CREATININE URINE RATIO
Creatinine,U: 218 mg/dL
MICROALB UR: 1.5 mg/dL (ref 0.0–1.9)
MICROALB/CREAT RATIO: 0.7 mg/g (ref 0.0–30.0)

## 2016-01-14 LAB — HEMOGLOBIN A1C: HEMOGLOBIN A1C: 7.6 % — AB (ref 4.6–6.5)

## 2016-01-14 LAB — PSA: PSA: 1.63 ng/mL (ref 0.10–4.00)

## 2016-01-15 ENCOUNTER — Ambulatory Visit (INDEPENDENT_AMBULATORY_CARE_PROVIDER_SITE_OTHER): Payer: 59 | Admitting: Internal Medicine

## 2016-01-15 ENCOUNTER — Encounter: Payer: Self-pay | Admitting: Internal Medicine

## 2016-01-15 VITALS — BP 140/80 | HR 88 | Temp 98.5°F | Resp 20 | Wt 254.0 lb

## 2016-01-15 DIAGNOSIS — I10 Essential (primary) hypertension: Secondary | ICD-10-CM | POA: Diagnosis not present

## 2016-01-15 DIAGNOSIS — J069 Acute upper respiratory infection, unspecified: Secondary | ICD-10-CM | POA: Diagnosis not present

## 2016-01-15 DIAGNOSIS — E785 Hyperlipidemia, unspecified: Secondary | ICD-10-CM | POA: Diagnosis not present

## 2016-01-15 DIAGNOSIS — Z0000 Encounter for general adult medical examination without abnormal findings: Secondary | ICD-10-CM | POA: Diagnosis not present

## 2016-01-15 DIAGNOSIS — E1110 Type 2 diabetes mellitus with ketoacidosis without coma: Secondary | ICD-10-CM

## 2016-01-15 DIAGNOSIS — J309 Allergic rhinitis, unspecified: Secondary | ICD-10-CM

## 2016-01-15 DIAGNOSIS — M79642 Pain in left hand: Secondary | ICD-10-CM

## 2016-01-15 DIAGNOSIS — Z1159 Encounter for screening for other viral diseases: Secondary | ICD-10-CM

## 2016-01-15 DIAGNOSIS — M25542 Pain in joints of left hand: Secondary | ICD-10-CM | POA: Insufficient documentation

## 2016-01-15 DIAGNOSIS — E1310 Other specified diabetes mellitus with ketoacidosis without coma: Secondary | ICD-10-CM | POA: Diagnosis not present

## 2016-01-15 NOTE — Progress Notes (Signed)
Pre visit review using our clinic review tool, if applicable. No additional management support is needed unless otherwise documented below in the visit note. 

## 2016-01-15 NOTE — Progress Notes (Signed)
Subjective:    Patient ID: Howard [name], male    DOB: [birth year], 55 y.o.   MRN: [id number]  HPI  Here for wellness and f/u;  Overall doing ok;  Pt denies Chest pain, worsening SOB, DOE, wheezing, orthopnea, PND, worsening LE edema, palpitations, dizziness or syncope.  Pt denies neurological change such as new headache, facial or extremity weakness.  Pt denies polydipsia, polyuria, or low sugar symptoms. Pt states overall good compliance with treatment and medications, good tolerability, and has been trying to follow appropriate diet.  Pt denies worsening depressive symptoms, suicidal ideation or panic. No fever, night sweats, wt loss, loss of appetite, or other constitutional symptoms.  Pt states good ability with ADL's, has low fall risk, home safety reviewed and adequate, no other significant changes in hearing or vision, and only occasionally active with exercise.  Has several complaints - c/o painful finger joint left 3rd finger PIP with some slight difficulty with flexion/extension.  Here with 2-3 days acute onset fever, facial pain, pressure, headache, general weakness and malaise, and greenish d/c, with mild ST and cough.  Does have several wks ongoing nasal allergy symptoms with clearish congestion, itch and sneezing, without fever, pain, ST, cough, swelling or wheezing.   Past Medical History  Diagnosis Date  . Diabetes mellitus   . PSA, INCREASED 05/11/2009    Qualifier: Diagnosis of  By: Ronnald Ramp MD, Arvid Right.   . HYPERTENSION 02/06/2008    Qualifier: Diagnosis of  By: Alain Marion MD, Evie Lacks   . HYPERLIPIDEMIA 10/06/2007    Qualifier: Diagnosis of  By: Alain Marion MD, Evie Lacks   . DIABETES MELLITUS, TYPE II 05/24/2007    Qualifier: Diagnosis of  By: Dance CMA ([place]), Kim    . Lumbar disc disease     s/p surgury approx 1992  . Allergic rhinitis, cause unspecified 01/15/2012   Past Surgical History  Procedure Laterality Date  . Back surgery      Lumbar/Ruptured Disk  . Hernia repair   may 2012    left inguinal  . Knee arthroscopy    . Nasal septum surgery      reports that he has never smoked. He has never used smokeless tobacco. He reports that he drinks alcohol. He reports that he does not use illicit drugs. family history includes Cancer in his mother; Diabetes in his father and mother; Hypertension in his father and mother. There is no history of Colon cancer. No Known Allergies Current Outpatient Prescriptions on File Prior to Visit  Medication Sig Dispense Refill  . Blood Glucose Monitoring Suppl (ONE TOUCH ULTRA 2) W/DEVICE KIT Use as directed daily 1 each 0  . cyclobenzaprine (FLEXERIL) 5 MG tablet Take 1 tablet (5 mg total) by mouth 3 (three) times daily as needed for muscle spasms. 60 tablet 1  . glucose blood (ONE TOUCH ULTRA TEST) test strip Use as instructed once daily. Diagnosis 250.0 100 each 12  . Lancets MISC 1 application by Does not apply route daily. 100 each 12  . lisinopril (PRINIVIL,ZESTRIL) 20 MG tablet Take 1 tablet (20 mg total) by mouth daily. 90 tablet 3  . metFORMIN (GLUCOPHAGE-XR) 500 MG 24 hr tablet TAKE FOUR TABLETS BY MOUTH ONCE DAILY 360 tablet 3  . naproxen (NAPROSYN) 500 MG tablet Take 1 tablet (500 mg total) by mouth 2 (two) times daily with a meal. 60 tablet 2  . pravastatin (PRAVACHOL) 40 MG tablet Take 1 tablet (40 mg total) by mouth daily. 90 tablet 1  .  fexofenadine (ALLEGRA) 180 MG tablet Take 1 tablet (180 mg total) by mouth daily. 90 tablet 3   No current facility-administered medications on file prior to visit.   Review of Systems  Constitutional: Negative for increased diaphoresis, or other activity, appetite or siginficant weight change other than noted HENT: Negative for worsening hearing loss, ear pain, facial swelling, mouth sores and neck stiffness.   Eyes: Negative for other worsening pain, redness or visual disturbance.  Respiratory: Negative for choking or stridor Cardiovascular: Negative for other chest pain and  palpitations.  Gastrointestinal: Negative for worsening diarrhea, blood in stool, or abdominal distention Genitourinary: Negative for hematuria, flank pain or change in urine volume.  Musculoskeletal: Negative for myalgias or other joint complaints.  Skin: Negative for other color change and wound or drainage.  Neurological: Negative for syncope and numbness. other than noted Hematological: Negative for adenopathy. or other swelling Psychiatric/Behavioral: Negative for hallucinations, SI, self-injury, decreased concentration or other worsening agitation.      Objective:   Physical Exam BP 140/80 mmHg  Pulse 88  Temp(Src) 98.5 F (36.9 C) (Oral)  Resp 20  Wt 254 lb (115.214 kg)  SpO2 95% VS noted,  Constitutional: Pt is oriented to person, place, and time. Appears well-developed and well-nourished, in no significant distress Head: Normocephalic and atraumatic  Eyes: Conjunctivae and EOM are normal. Pupils are equal, round, and reactive to light Right Ear: External ear normal.  Left Ear: External ear normal Nose: Nose normal.  Mouth/Throat: Oropharynx is clear and moist  Bilat tm's with mild erythema.  Max sinus areas mild tender.  Pharynx with mild erythema, no exudate Neck: Normal range of motion. Neck supple. No JVD present. No tracheal deviation present or significant neck LA or mass Cardiovascular: Normal rate, regular rhythm, normal heart sounds and intact distal pulses.   Pulmonary/Chest: Effort normal and breath sounds without rales or wheezing  Abdominal: Soft. Bowel sounds are normal. NT. No HSM  Musculoskeletal: Normal range of motion. Exhibits no edema Lymphadenopathy: Has no cervical adenopathy.  Neurological: Pt is alert and oriented to person, place, and time. Pt has normal reflexes. No cranial nerve deficit. Motor grossly intact Skin: Skin is warm and dry. No rash noted or new ulcers Psychiatric:  Has normal mood and affect. Behavior is normal.  Left third finger  PIP with mild bony enlargement and swelling  ECG - NSR only, no acute STTWchanges    Assessment & Plan:

## 2016-01-15 NOTE — Patient Instructions (Addendum)
Your EKG and labs were OK today  You can also take Delsym OTC for cough, and/or Mucinex (or it's generic off brand) for congestion, and tylenol as needed for pain, aswell as Nasacort OTC for allergies  We can leave your medications alone for sugar and cholesterol if you are going to do better with your diet and exercise.  Please continue all other medications as before, and refills have been done if requested.  Please have the pharmacy call with any other refills you may need.  Please continue your efforts at being more active, low cholesterol diet, and weight control.  You are otherwise up to date with prevention measures today.  Please keep your appointments with your specialists as you may have planned  You will be contacted regarding the referral for: colonoscopy for June 2017  Please return in 6 months, or sooner if needed, with Lab testing done 3-5 days before

## 2016-01-18 NOTE — Assessment & Plan Note (Signed)
stable overall by history and exam, recent data reviewed with pt, and pt to continue medical treatment as before,  to f/u any worsening symptoms or concerns BP Readings from Last 3 Encounters:  01/15/16 140/80  08/16/15 146/88  07/14/15 140/82

## 2016-01-18 NOTE — Assessment & Plan Note (Signed)

## 2016-01-18 NOTE — Assessment & Plan Note (Signed)
/

## 2016-01-18 NOTE — Assessment & Plan Note (Signed)
Mild, for otc capsiacin P prn,  to f/u any worsening symptoms or concerns

## 2016-01-18 NOTE — Assessment & Plan Note (Addendum)
stable overall by history and exam, recent data reviewed with pt, and pt to continue medical treatment as before,  to f/u any worsening symptoms or concerns Lab Results  Component Value Date   HGBA1C 7.6* 01/14/2016   In addition to the time spent performing CPE, I spent an additional 40 minutes face to face,in which greater than 50% of this time was spent in counseling and coordination of care for patient's acute illness as documented.

## 2016-01-18 NOTE — Assessment & Plan Note (Signed)
stable overall by history and exam, recent data reviewed with pt, and pt to continue medical treatment as before,  to f/u any worsening symptoms or concerns Lab Results  Component Value Date   LDLCALC 99 01/14/2016

## 2016-01-18 NOTE — Assessment & Plan Note (Signed)
Mild to mod, for antibx course,  to f/u any worsening symptoms or concerns 

## 2016-02-21 ENCOUNTER — Encounter: Payer: Self-pay | Admitting: Internal Medicine

## 2016-04-04 ENCOUNTER — Other Ambulatory Visit: Payer: Self-pay | Admitting: Internal Medicine

## 2016-05-15 ENCOUNTER — Telehealth: Payer: Self-pay | Admitting: Emergency Medicine

## 2016-05-15 MED ORDER — PRAVASTATIN SODIUM 40 MG PO TABS: 40.0000 mg | ORAL_TABLET | Freq: Every day | ORAL | 1 refills | Status: DC

## 2016-05-15 NOTE — Telephone Encounter (Signed)
Pt needs a prescription refill on pravastatin (PRAVACHOL) 40 MG tablet. [facility] [address]. Please follow up thanks.

## 2016-05-15 NOTE — Telephone Encounter (Signed)
Medication refill sent to pharmacy  

## 2016-05-17 ENCOUNTER — Other Ambulatory Visit: Payer: Self-pay | Admitting: Internal Medicine

## 2016-05-19 ENCOUNTER — Telehealth: Payer: Self-pay | Admitting: Emergency Medicine

## 2016-05-19 MED ORDER — LISINOPRIL 20 MG PO TABS: 20.0000 mg | ORAL_TABLET | Freq: Every day | ORAL | 3 refills | Status: DC

## 2016-05-19 NOTE — Telephone Encounter (Signed)
Pt needs a prescription refill on lisinopril (PRINIVIL,ZESTRIL) 20 MG tablet. [facility]. Please follow up thanks.

## 2016-05-19 NOTE — Telephone Encounter (Signed)
Medication refill sent to pharmacy  

## 2016-06-02 ENCOUNTER — Ambulatory Visit (INDEPENDENT_AMBULATORY_CARE_PROVIDER_SITE_OTHER): Payer: 59 | Admitting: Family Medicine

## 2016-06-02 ENCOUNTER — Encounter: Payer: Self-pay | Admitting: Family Medicine

## 2016-06-02 VITALS — BP 142/92 | HR 98 | Temp 98.6°F | Ht 74.0 in | Wt 256.8 lb

## 2016-06-02 DIAGNOSIS — M545 Low back pain: Secondary | ICD-10-CM | POA: Diagnosis not present

## 2016-06-02 DIAGNOSIS — M62830 Muscle spasm of back: Secondary | ICD-10-CM | POA: Diagnosis not present

## 2016-06-02 MED ORDER — CYCLOBENZAPRINE HCL 5 MG PO TABS: 5.0000 mg | ORAL_TABLET | Freq: Three times a day (TID) | ORAL | 1 refills | Status: DC | PRN

## 2016-06-02 MED ORDER — NAPROXEN 500 MG PO TABS: 500.0000 mg | ORAL_TABLET | Freq: Two times a day (BID) | ORAL | 0 refills | Status: DC

## 2016-06-02 NOTE — Progress Notes (Signed)
Subjective:    Patient ID: Howard [name], male    DOB: [birth year], 55 y.o.   MRN: [id number]  HPI  Howard [name] is a 55 year old male who presents today with muscle spasms in his lower back that started after an 8 hour car ride and sleeping in a hotel one week ago.  He describes pain as a 2 but can be as high as a 9 or 10 when a spasm occurs. Treatment at home includes heat and ice which have provided moderate benefit.  He denies fever, chills, sweats, radiation of pain, LE pain/numbness, loss of control of bowel/bladder, gait change or falls. Associated symptom of feeling "stiff" when standing. He reports that pain is worse when bending or standing but is improved after he begins to walk.  Review of Systems  Constitutional: Negative for chills, fatigue and fever.  Respiratory: Negative for cough and shortness of breath.   Cardiovascular: Negative for chest pain, palpitations and leg swelling.  Gastrointestinal: Negative for abdominal pain, constipation, diarrhea, nausea and vomiting.  Musculoskeletal: Positive for back pain.       Muscle spasm  Skin: Negative for rash.  Neurological: Negative for dizziness, light-headedness and headaches.   Past Medical History:  Diagnosis Date  . Allergic rhinitis, cause unspecified 01/15/2012  . Diabetes mellitus   . DIABETES MELLITUS, TYPE II 05/24/2007   Qualifier: Diagnosis of  By: Dance CMA ([place]), Kim    . HYPERLIPIDEMIA 10/06/2007   Qualifier: Diagnosis of  By: Alain Marion MD, Evie Lacks   . HYPERTENSION 02/06/2008   Qualifier: Diagnosis of  By: Alain Marion MD, Evie Lacks   . Lumbar disc disease    s/p surgury approx 1992  . PSA, INCREASED 05/11/2009   Qualifier: Diagnosis of  By: Ronnald Ramp MD, Arvid Right.      Social History   Social History  . Marital status: Married    Spouse name: N/A  . Number of children: N/A  . Years of education: N/A   Occupational History  . Not on file.   Social History Main Topics  . Smoking status: Never Smoker  .  Smokeless tobacco: Never Used  . Alcohol use Yes     Comment: very rare   . Drug use: No  . Sexual activity: Not on file   Other Topics Concern  . Not on file   Social History Narrative  . No narrative on file    Past Surgical History:  Procedure Laterality Date  . BACK SURGERY     Lumbar/Ruptured Disk  . HERNIA REPAIR  may 2012   left inguinal  . KNEE ARTHROSCOPY    . NASAL SEPTUM SURGERY      Family History  Problem Relation Age of Onset  . Hypertension Mother   . Diabetes Mother   . Cancer Mother   . Hypertension Father   . Diabetes Father   . Colon cancer Neg Hx     No Known Allergies  Current Outpatient Prescriptions on File Prior to Visit  Medication Sig Dispense Refill  . Blood Glucose Monitoring Suppl (ONE TOUCH ULTRA 2) W/DEVICE KIT Use as directed daily 1 each 0  . Lancets MISC 1 application by Does not apply route daily. 100 each 12  . lisinopril (PRINIVIL,ZESTRIL) 10 MG tablet TAKE ONE TABLET BY MOUTH ONCE DAILY 90 tablet 0  . lisinopril (PRINIVIL,ZESTRIL) 20 MG tablet Take 1 tablet (20 mg total) by mouth daily. 90 tablet 3  . metFORMIN (GLUCOPHAGE-XR) 500 MG 24 hr  tablet TAKE FOUR TABLETS BY MOUTH ONCE DAILY 360 tablet 0  . pravastatin (PRAVACHOL) 40 MG tablet Take 1 tablet (40 mg total) by mouth daily. 90 tablet 1  . fexofenadine (ALLEGRA) 180 MG tablet Take 1 tablet (180 mg total) by mouth daily. 90 tablet 3  . glucose blood (ONE TOUCH ULTRA TEST) test strip Use as instructed once daily. Diagnosis 250.0 (Patient not taking: Reported on 06/02/2016) 100 each 12   No current facility-administered medications on file prior to visit.     BP (!) 142/92 (BP Location: Right Arm, Cuff Size: Large)   Pulse 98   Temp 98.6 F (37 C) (Oral)   Ht _0  (1.88 m)   Wt 256 lb 12.8 oz (116.5 kg)   SpO2 98%   BMI 32.97 kg/m       Objective:   Physical Exam  Constitutional: He is oriented to person, place, and time. He appears well-developed and  well-nourished.  Eyes: Pupils are equal, round, and reactive to light. No scleral icterus.  Neck: Neck supple.  Cardiovascular: Normal rate and regular rhythm.   Pulmonary/Chest: Effort normal and breath sounds normal. He has no wheezes. He has no rales.  Musculoskeletal:  Spine with normal alignment and no deformity. No tenderness to vertebral process with palpation with the exception of mild tenderness on patient's right side around L5.  Paraspinous muscles are tender and patient notes this area as painful and gripping at times when a spasm occurs. ROM is full at lumbar sacral regions. Negative Straight Leg raise. No CVA tenderness present. Able to heel/toe walk without pain.  Lymphadenopathy:    He has no cervical adenopathy.  Neurological: He is alert and oriented to person, place, and time.  Skin: Skin is warm and dry. No rash noted.  Psychiatric: He has a normal mood and affect. His behavior is normal. Judgment and thought content normal.       Assessment & Plan:  1. Right low back pain, with sciatica presence unspecified Suspect lower back pain is a muscle strain vs. Lumbar ddd/djd. Treat with nsaid prn for a short term basis, tylenol prn, and muscle relaxer for spasms.  Advised patient to follow up if symptoms do not improve, worsen, or he develops new symptoms or concerns. - naproxen (NAPROSYN) 500 MG tablet; Take 1 tablet (500 mg total) by mouth 2 (two) times daily with a meal.  Dispense: 60 tablet; Refill: 0  2. Muscle spasm of back  - cyclobenzaprine (FLEXERIL) 5 MG tablet; Take 1 tablet (5 mg total) by mouth 3 (three) times daily as needed for muscle spasms.  Dispense: 60 tablet; Refill: 1  Delano Metz, FNP-C

## 2016-06-02 NOTE — Patient Instructions (Signed)
Please follow up with Dr. Judi Cong if symptoms do not improve with treatment, worsen, or you develop new symptoms.  It was a pleasure to see you today!   Back Pain, Adult Back pain is very common in adults.The cause of back pain is rarely dangerous and the pain often gets better over time.The cause of your back pain may not be known. Some common causes of back pain include:  Strain of the muscles or ligaments supporting the spine.  Wear and tear (degeneration) of the spinal disks.  Arthritis.  Direct injury to the back. For many people, back pain may return. Since back pain is rarely dangerous, most people can learn to manage this condition on their own. HOME CARE INSTRUCTIONS Watch your back pain for any changes. The following actions may help to lessen any discomfort you are feeling:  Remain active. It is stressful on your back to sit or stand in one place for long periods of time. Do not sit, drive, or stand in one place for more than 30 minutes at a time. Take short walks on even surfaces as soon as you are able.Try to increase the length of time you walk each day.  Exercise regularly as directed by your health care provider. Exercise helps your back heal faster. It also helps avoid future injury by keeping your muscles strong and flexible.  Do not stay in bed.Resting more than 1-2 days can delay your recovery.  Pay attention to your body when you bend and lift. The most comfortable positions are those that put less stress on your recovering back. Always use proper lifting techniques, including:  Bending your knees.  Keeping the load close to your body.  Avoiding twisting.  Find a comfortable position to sleep. Use a firm mattress and lie on your side with your knees slightly bent. If you lie on your back, put a pillow under your knees.  Avoid feeling anxious or stressed.Stress increases muscle tension and can worsen back pain.It is important to recognize when you are  anxious or stressed and learn ways to manage it, such as with exercise.  Take medicines only as directed by your health care provider. Over-the-counter medicines to reduce pain and inflammation are often the most helpful.Your health care provider may prescribe muscle relaxant drugs.These medicines help dull your pain so you can more quickly return to your normal activities and healthy exercise.  Apply ice to the injured area:  Put ice in a plastic bag.  Place a towel between your skin and the bag.  Leave the ice on for 20 minutes, 2-3 times a day for the first 2-3 days. After that, ice and heat may be alternated to reduce pain and spasms.  Maintain a healthy weight. Excess weight puts extra stress on your back and makes it difficult to maintain good posture. SEEK MEDICAL CARE IF:  You have pain that is not relieved with rest or medicine.  You have increasing pain going down into the legs or buttocks.  You have pain that does not improve in one week.  You have night pain.  You lose weight.  You have a fever or chills. SEEK IMMEDIATE MEDICAL CARE IF:   You develop new bowel or bladder control problems.  You have unusual weakness or numbness in your arms or legs.  You develop nausea or vomiting.  You develop abdominal pain.  You feel faint.   This information is not intended to replace advice given to you by your health care provider.  Make sure you discuss any questions you have with your health care provider.   Document Released: 10/06/2005 Document Revised: 10/27/2014 Document Reviewed: 02/07/2014 Elsevier Interactive Patient Education [employer].

## 2016-06-02 NOTE — Progress Notes (Signed)
Pre visit review using our clinic review tool, if applicable. No additional management support is needed unless otherwise documented below in the visit note. 

## 2016-06-24 ENCOUNTER — Ambulatory Visit (INDEPENDENT_AMBULATORY_CARE_PROVIDER_SITE_OTHER): Payer: 59 | Admitting: Internal Medicine

## 2016-06-24 ENCOUNTER — Encounter: Payer: Self-pay | Admitting: Internal Medicine

## 2016-06-24 VITALS — BP 118/76 | HR 100 | Temp 98.2°F | Resp 20 | Wt 257.0 lb

## 2016-06-24 DIAGNOSIS — M5416 Radiculopathy, lumbar region: Secondary | ICD-10-CM | POA: Insufficient documentation

## 2016-06-24 DIAGNOSIS — I10 Essential (primary) hypertension: Secondary | ICD-10-CM | POA: Diagnosis not present

## 2016-06-24 DIAGNOSIS — E1310 Other specified diabetes mellitus with ketoacidosis without coma: Secondary | ICD-10-CM

## 2016-06-24 DIAGNOSIS — E1110 Type 2 diabetes mellitus with ketoacidosis without coma: Secondary | ICD-10-CM

## 2016-06-24 MED ORDER — PREDNISONE 10 MG PO TABS: ORAL_TABLET | ORAL | 0 refills | Status: DC

## 2016-06-24 MED ORDER — TRAMADOL HCL 50 MG PO TABS: 50.0000 mg | ORAL_TABLET | Freq: Four times a day (QID) | ORAL | 2 refills | Status: DC | PRN

## 2016-06-24 NOTE — Patient Instructions (Signed)
Please take all new medication as prescribed - the pain medication and the prednisone  OK to stop the naproxyn for now  Please continue all other medications as before, including the muscle relaxer  Please have the pharmacy call with any other refills you may need.  Please keep your appointments with your specialists as you may have planned  Please call in 1 week if not improved, to consider MRI lower back

## 2016-06-24 NOTE — Progress Notes (Signed)
Pre visit review using our clinic review tool, if applicable. No additional management support is needed unless otherwise documented below in the visit note. 

## 2016-06-24 NOTE — Assessment & Plan Note (Signed)
New worsening realted to long car ride, with mild worsening RLE weakness/neuro change, ok for pain control, predpac asd, and consider MRI if not improved in 1 wk per pt preference,  to f/u any worsening symptoms or concerns

## 2016-06-24 NOTE — Progress Notes (Signed)
Subjective:    Patient ID: Howard [name], male    DOB: [birth year], 55 y.o.   MRN: [id number]  HPI  Here with c/o back pain since aug 4 -  Had recent 8 hr car ride aug 4, next day with wrosening acute onset mild to  Mod right lower back pain with some numbness and tingling, some pain , was seen previously aug 14 with muscle relaxdr tx that helped, but now with recurrent symtpoms assoc with some new weakness of the RLE.  Is s/p lumbar surgury per Dr Joya Salm for left lumbar radiculopathy disc releated in 1989 without symptoms recurrent until now. Pt denies chest pain, increased sob or doe, wheezing, orthopnea, PND, increased LE swelling, palpitations, dizziness or syncope.  Pt denies new neurological symptoms such as new headache, or facial weakness.   Pt denies polydipsia, polyuria  naproxyn no longer helping. Muscle relasxer may help somewhat, does not need refilll Past Medical History:  Diagnosis Date  . Allergic rhinitis, cause unspecified 01/15/2012  . Diabetes mellitus   . DIABETES MELLITUS, TYPE II 05/24/2007   Qualifier: Diagnosis of  By: Dance CMA ([place]), Kim    . HYPERLIPIDEMIA 10/06/2007   Qualifier: Diagnosis of  By: Alain Marion MD, Evie Lacks   . HYPERTENSION 02/06/2008   Qualifier: Diagnosis of  By: Alain Marion MD, Evie Lacks   . Lumbar disc disease    s/p surgury approx 1992  . PSA, INCREASED 05/11/2009   Qualifier: Diagnosis of  By: Ronnald Ramp MD, Arvid Right.    Past Surgical History:  Procedure Laterality Date  . BACK SURGERY     Lumbar/Ruptured Disk  . HERNIA REPAIR  may 2012   left inguinal  . KNEE ARTHROSCOPY    . NASAL SEPTUM SURGERY      reports that he has never smoked. He has never used smokeless tobacco. He reports that he drinks alcohol. He reports that he does not use drugs. family history includes Cancer in his mother; Diabetes in his father and mother; Hypertension in his father and mother. No Known Allergies Current Outpatient Prescriptions on File Prior to Visit    Medication Sig Dispense Refill  . Blood Glucose Monitoring Suppl (ONE TOUCH ULTRA 2) W/DEVICE KIT Use as directed daily 1 each 0  . cyclobenzaprine (FLEXERIL) 5 MG tablet Take 1 tablet (5 mg total) by mouth 3 (three) times daily as needed for muscle spasms. 60 tablet 1  . glucose blood (ONE TOUCH ULTRA TEST) test strip Use as instructed once daily. Diagnosis 250.0 100 each 12  . Lancets MISC 1 application by Does not apply route daily. 100 each 12  . lisinopril (PRINIVIL,ZESTRIL) 10 MG tablet TAKE ONE TABLET BY MOUTH ONCE DAILY 90 tablet 0  . lisinopril (PRINIVIL,ZESTRIL) 20 MG tablet Take 1 tablet (20 mg total) by mouth daily. 90 tablet 3  . metFORMIN (GLUCOPHAGE-XR) 500 MG 24 hr tablet TAKE FOUR TABLETS BY MOUTH ONCE DAILY 360 tablet 0  . naproxen (NAPROSYN) 500 MG tablet Take 1 tablet (500 mg total) by mouth 2 (two) times daily with a meal. 60 tablet 0  . pravastatin (PRAVACHOL) 40 MG tablet Take 1 tablet (40 mg total) by mouth daily. 90 tablet 1  . fexofenadine (ALLEGRA) 180 MG tablet Take 1 tablet (180 mg total) by mouth daily. 90 tablet 3   No current facility-administered medications on file prior to visit.    Review of Systems  Constitutional: Negative for unusual diaphoresis or night sweats HENT: Negative for ear swelling or  discharge Eyes: Negative for worsening visual haziness  Respiratory: Negative for choking and stridor.   Gastrointestinal: Negative for distension or worsening eructation Genitourinary: Negative for retention or change in urine volume.  Musculoskeletal: Negative for other MSK pain or swelling Skin: Negative for color change and worsening wound Neurological: Negative for tremors and numbness other than noted  Psychiatric/Behavioral: Negative for decreased concentration or agitation other than above       Objective:   Physical Exam BP 118/76   Pulse 100   Temp 98.2 F (36.8 C) (Oral)   Resp 20   Wt 257 lb (116.6 kg)   SpO2 96%   BMI 33.00 kg/m  VS  noted,  Constitutional: Pt appears in no apparent distress HENT: Head: NCAT.  Right Ear: External ear normal.  Left Ear: External ear normal.  Eyes: . Pupils are equal, round, and reactive to light. Conjunctivae and EOM are normal Neck: Normal range of motion. Neck supple.  Cardiovascular: Normal rate and regular rhythm.   Pulmonary/Chest: Effort normal and breath sounds without rales or wheezing.  Spine without tenderness or paravertebral muscular spasm Neurological: Pt is alert. Not confused , motor 4+/5 RLE intact with decreased sensat to LT below the right knee/leg, DTR symmteric Skin: Skin is warm. No rash, no LE edema Psychiatric: Pt behavior is normal. No agitation.     Assessment & Plan:

## 2016-06-24 NOTE — Assessment & Plan Note (Signed)
stable overall by history and exam, recent data reviewed with pt, and pt to continue medical treatment as before,  to f/u any worsening symptoms or concerns BP Readings from Last 3 Encounters:  06/24/16 118/76  06/02/16 (!) 142/92  01/15/16 140/80

## 2016-06-24 NOTE — Assessment & Plan Note (Signed)
stable overall by history and exam, recent data reviewed with pt, and pt to continue medical treatment as before,  to f/u any worsening symptoms or concerns Lab Results  Component Value Date   HGBA1C 7.6 (H) 01/14/2016   Pt to call for cbg > 200, or onset polys with steroid tx

## 2016-07-18 ENCOUNTER — Other Ambulatory Visit (INDEPENDENT_AMBULATORY_CARE_PROVIDER_SITE_OTHER): Payer: 59

## 2016-07-18 DIAGNOSIS — E1110 Type 2 diabetes mellitus with ketoacidosis without coma: Secondary | ICD-10-CM

## 2016-07-18 DIAGNOSIS — E1310 Other specified diabetes mellitus with ketoacidosis without coma: Secondary | ICD-10-CM

## 2016-07-18 DIAGNOSIS — Z1159 Encounter for screening for other viral diseases: Secondary | ICD-10-CM

## 2016-07-18 LAB — HEPATIC FUNCTION PANEL
ALBUMIN: 3.8 g/dL (ref 3.5–5.2)
ALT: 27 U/L (ref 0–53)
AST: 25 U/L (ref 0–37)
Alkaline Phosphatase: 62 U/L (ref 39–117)
Bilirubin, Direct: 0 mg/dL (ref 0.0–0.3)
TOTAL PROTEIN: 6.7 g/dL (ref 6.0–8.3)
Total Bilirubin: 0.4 mg/dL (ref 0.2–1.2)

## 2016-07-18 LAB — LIPID PANEL
CHOL/HDL RATIO: 4
Cholesterol: 197 mg/dL (ref 0–200)
HDL: 47.3 mg/dL (ref >39.00)
LDL Cholesterol: 128 mg/dL — ABNORMAL HIGH (ref 0–99)
NONHDL: 149.61
TRIGLYCERIDES: 109 mg/dL (ref 0.0–149.0)
VLDL: 21.8 mg/dL (ref 0.0–40.0)

## 2016-07-18 LAB — BASIC METABOLIC PANEL
BUN: 14 mg/dL (ref 6–23)
CALCIUM: 8.9 mg/dL (ref 8.4–10.5)
CO2: 28 meq/L (ref 19–32)
Chloride: 105 mEq/L (ref 96–112)
Creatinine, Ser: 0.94 mg/dL (ref 0.40–1.50)
GFR: 107.42 mL/min (ref >60.00)
GLUCOSE: 127 mg/dL — AB (ref 70–99)
Potassium: 4.4 mEq/L (ref 3.5–5.1)
Sodium: 138 mEq/L (ref 135–145)

## 2016-07-18 LAB — HEMOGLOBIN A1C: HEMOGLOBIN A1C: 7.6 % — AB (ref 4.6–6.5)

## 2016-07-19 LAB — HEPATITIS C ANTIBODY: HCV Ab: NEGATIVE

## 2016-07-22 ENCOUNTER — Ambulatory Visit (INDEPENDENT_AMBULATORY_CARE_PROVIDER_SITE_OTHER): Payer: 59 | Admitting: Internal Medicine

## 2016-07-22 ENCOUNTER — Encounter: Payer: Self-pay | Admitting: Internal Medicine

## 2016-07-22 VITALS — BP 136/78 | HR 80 | Resp 20 | Wt 257.0 lb

## 2016-07-22 DIAGNOSIS — Z0001 Encounter for general adult medical examination with abnormal findings: Secondary | ICD-10-CM

## 2016-07-22 DIAGNOSIS — I10 Essential (primary) hypertension: Secondary | ICD-10-CM

## 2016-07-22 DIAGNOSIS — M5416 Radiculopathy, lumbar region: Secondary | ICD-10-CM

## 2016-07-22 DIAGNOSIS — E785 Hyperlipidemia, unspecified: Secondary | ICD-10-CM

## 2016-07-22 DIAGNOSIS — E119 Type 2 diabetes mellitus without complications: Secondary | ICD-10-CM

## 2016-07-22 NOTE — Progress Notes (Signed)
Subjective:    Patient ID: Howard [name], male    DOB: 02-03-1961, 55 y.o.   MRN: [id number]  HPI   Here to f/u; overall doing ok,  Pt denies chest pain, increasing sob or doe, wheezing, orthopnea, PND, increased LE swelling, palpitations, dizziness or syncope.  Pt denies new neurological symptoms such as new headache, or facial or extremity weakness or numbness.  Pt denies polydipsia, polyuria, or low sugar episode.   Pt denies new neurological symptoms such as new headache, or facial or extremity weakness or numbness.   Pt states overall good compliance with meds, mostly trying to follow appropriate diet, with wt overall stable, but admits to several meds not taken recently Wt Readings from Last 3 Encounters:  07/22/16 257 lb (116.6 kg)  06/24/16 257 lb (116.6 kg)  06/02/16 256 lb 12.8 oz (116.5 kg)  Has been out of pravachol for several wk, restarted yesterday. Also although the lower back pain previously has improved, and no tingling or numbness, but has persistent and worse RLE weakness, with change in gait and difficulty raising leg, but no falls or knee pain. Position does not as nothing else makes better or worse.   Past Medical History:  Diagnosis Date  . Allergic rhinitis, cause unspecified 01/15/2012  . Diabetes mellitus   . DIABETES MELLITUS, TYPE II 05/24/2007   Qualifier: Diagnosis of  By: Dance CMA ([facility]), Kim    . HYPERLIPIDEMIA 10/06/2007   Qualifier: Diagnosis of  By: Alain Marion MD, Evie Lacks   . HYPERTENSION 02/06/2008   Qualifier: Diagnosis of  By: Alain Marion MD, Evie Lacks   . Lumbar disc disease    s/p surgury approx 1992  . PSA, INCREASED 05/11/2009   Qualifier: Diagnosis of  By: Ronnald Ramp MD, Arvid Right.    Past Surgical History:  Procedure Laterality Date  . BACK SURGERY     Lumbar/Ruptured Disk  . HERNIA REPAIR  may 2012   left inguinal  . KNEE ARTHROSCOPY    . NASAL SEPTUM SURGERY      reports that he has never smoked. He has never used smokeless tobacco. He reports  that he drinks alcohol. He reports that he does not use drugs. family history includes Cancer in his mother; Diabetes in his father and mother; Hypertension in his father and mother. No Known Allergies Current Outpatient Prescriptions on File Prior to Visit  Medication Sig Dispense Refill  . Blood Glucose Monitoring Suppl (ONE TOUCH ULTRA 2) W/DEVICE KIT Use as directed daily 1 each 0  . cyclobenzaprine (FLEXERIL) 5 MG tablet Take 1 tablet (5 mg total) by mouth 3 (three) times daily as needed for muscle spasms. 60 tablet 1  . glucose blood (ONE TOUCH ULTRA TEST) test strip Use as instructed once daily. Diagnosis 250.0 100 each 12  . Lancets MISC 1 application by Does not apply route daily. 100 each 12  . lisinopril (PRINIVIL,ZESTRIL) 20 MG tablet Take 1 tablet (20 mg total) by mouth daily. 90 tablet 3  . metFORMIN (GLUCOPHAGE-XR) 500 MG 24 hr tablet TAKE FOUR TABLETS BY MOUTH ONCE DAILY 360 tablet 0  . naproxen (NAPROSYN) 500 MG tablet Take 1 tablet (500 mg total) by mouth 2 (two) times daily with a meal. 60 tablet 0  . pravastatin (PRAVACHOL) 40 MG tablet Take 1 tablet (40 mg total) by mouth daily. 90 tablet 1  . traMADol (ULTRAM) 50 MG tablet Take 1 tablet (50 mg total) by mouth every 6 (six) hours as needed. 60 tablet 2  .  fexofenadine (ALLEGRA) 180 MG tablet Take 1 tablet (180 mg total) by mouth daily. 90 tablet 3   No current facility-administered medications on file prior to visit.      Review of Systems  Constitutional: Negative for unusual diaphoresis or night sweats HENT: Negative for ear swelling or discharge Eyes: Negative for worsening visual haziness  Respiratory: Negative for choking and stridor.   Gastrointestinal: Negative for distension or worsening eructation Genitourinary: Negative for retention or change in urine volume.  Musculoskeletal: Negative for other MSK pain or swelling Skin: Negative for color change and worsening wound Neurological: Negative for tremors and  numbness other than noted  Psychiatric/Behavioral: Negative for decreased concentration or agitation other than above       Objective:   Physical Exam BP 136/78   Pulse 80   Resp 20   Wt 257 lb (116.6 kg)   SpO2 98%   BMI 33.00 kg/m  BP 136/78   Pulse 80   Resp 20   Wt 257 lb (116.6 kg)   SpO2 98%   BMI 33.00 kg/m  VS noted,  Constitutional: Pt appears in no apparent distress HENT: Head: NCAT.  Right Ear: External ear normal.  Left Ear: External ear normal.  Eyes: . Pupils are equal, round, and reactive to light. Conjunctivae and EOM are normal Neck: Normal range of motion. Neck supple.  Cardiovascular: Normal rate and regular rhythm.   Pulmonary/Chest: Effort normal and breath sounds without rales or wheezing.  Abd:  Soft, NT, ND, + BS Spine: nontender Neurological: Pt is alert. Not confused , motor 4+/5 RLE and o/w intact Skin: Skin is warm. No rash, no LE edema Psychiatric: Pt behavior is normal. No agitation.     Assessment & Plan:

## 2016-07-22 NOTE — Patient Instructions (Signed)
Please continue all other medications as before, and refills have been done if requested.  Please have the pharmacy call with any other refills you may need.  Please continue your efforts at being more active, low cholesterol diet, and weight control.  Please keep your appointments with your specialists as you may have planned  Please call if you change your mind about the MRI for the lower back  Another option would be to get a second opinion from Dr Smith/sports medicine in this office   Please return in 6 months, or sooner if needed, with Lab testing done 3-5 days before

## 2016-07-22 NOTE — Progress Notes (Signed)
Pre visit review using our clinic review tool, if applicable. No additional management support is needed unless otherwise documented below in the visit note. 

## 2016-07-27 NOTE — Assessment & Plan Note (Signed)
stable overall by history and exam, recent data reviewed with pt, and pt to continue medical treatment as before,  to f/u any worsening symptoms or concerns BP Readings from Last 3 Encounters:  07/22/16 136/78  06/24/16 118/76  06/02/16 (!) 142/92

## 2016-07-27 NOTE — Assessment & Plan Note (Signed)
Though pain improved, has significant weakness c/w radiculopathy, for MRI, consider ortho f/u

## 2016-07-27 NOTE — Assessment & Plan Note (Signed)
stable overall by history and exam, recent data reviewed with pt, and pt to continue medical treatment as before,  to f/u any worsening symptoms or concerns Lab Results  Component Value Date   LDLCALC 128 (H) 07/18/2016   Encouraged med compliance

## 2016-07-27 NOTE — Assessment & Plan Note (Addendum)
stable overall by history and exam, recent data reviewed with pt, and pt to continue medical treatment as before,  to f/u any worsening symptoms or concerns Lab Results  Component Value Date   HGBA1C 7.6 (H) 07/18/2016   Encouraged med compliance

## 2016-07-28 ENCOUNTER — Other Ambulatory Visit: Payer: Self-pay | Admitting: Internal Medicine

## 2016-07-28 DIAGNOSIS — T1590XA Foreign body on external eye, part unspecified, unspecified eye, initial encounter: Secondary | ICD-10-CM

## 2016-08-08 ENCOUNTER — Ambulatory Visit
Admission: RE | Admit: 2016-08-08 | Discharge: 2016-08-08 | Disposition: A | Discharge status: Home / Self Care | Payer: 59 | Source: Ambulatory Visit | Attending: Internal Medicine | Admitting: Internal Medicine

## 2016-08-08 ENCOUNTER — Other Ambulatory Visit: Payer: Self-pay | Admitting: Internal Medicine

## 2016-08-08 DIAGNOSIS — M5416 Radiculopathy, lumbar region: Secondary | ICD-10-CM

## 2016-08-08 DIAGNOSIS — T1590XA Foreign body on external eye, part unspecified, unspecified eye, initial encounter: Secondary | ICD-10-CM

## 2016-08-25 ENCOUNTER — Other Ambulatory Visit: Payer: Self-pay | Admitting: Internal Medicine

## 2016-09-25 ENCOUNTER — Other Ambulatory Visit: Payer: Self-pay | Admitting: Internal Medicine

## 2016-12-02 ENCOUNTER — Other Ambulatory Visit: Payer: Self-pay | Admitting: Internal Medicine

## 2016-12-18 LAB — HM DIABETES EYE EXAM

## 2016-12-24 ENCOUNTER — Encounter: Payer: Self-pay | Admitting: Internal Medicine

## 2016-12-24 NOTE — Progress Notes (Unsigned)
Results entered and sent to scan  

## 2017-01-14 ENCOUNTER — Other Ambulatory Visit (INDEPENDENT_AMBULATORY_CARE_PROVIDER_SITE_OTHER): Payer: 59

## 2017-01-14 DIAGNOSIS — Z0001 Encounter for general adult medical examination with abnormal findings: Secondary | ICD-10-CM

## 2017-01-14 DIAGNOSIS — E119 Type 2 diabetes mellitus without complications: Secondary | ICD-10-CM | POA: Diagnosis not present

## 2017-01-14 LAB — CBC WITH DIFFERENTIAL/PLATELET
BASOS ABS: 0 10*3/uL (ref 0.0–0.1)
Basophils Relative: 1.2 % (ref 0.0–3.0)
EOS ABS: 0.1 10*3/uL (ref 0.0–0.7)
EOS PCT: 2.2 % (ref 0.0–5.0)
HCT: 42.5 % (ref 39.0–52.0)
HEMOGLOBIN: 13.9 g/dL (ref 13.0–17.0)
LYMPHS ABS: 1.3 10*3/uL (ref 0.7–4.0)
Lymphocytes Relative: 31.8 % (ref 12.0–46.0)
MCHC: 32.7 g/dL (ref 30.0–36.0)
MCV: 83.7 fl (ref 78.0–100.0)
MONO ABS: 0.5 10*3/uL (ref 0.1–1.0)
Monocytes Relative: 13.2 % — ABNORMAL HIGH (ref 3.0–12.0)
Neutro Abs: 2.1 10*3/uL (ref 1.4–7.7)
Neutrophils Relative %: 51.6 % (ref 43.0–77.0)
PLATELETS: 218 10*3/uL (ref 150.0–400.0)
RBC: 5.07 Mil/uL (ref 4.22–5.81)
RDW: 14.3 % (ref 11.5–15.5)
WBC: 4.1 10*3/uL (ref 4.0–10.5)

## 2017-01-14 LAB — HEPATIC FUNCTION PANEL
ALBUMIN: 4.4 g/dL (ref 3.5–5.2)
ALK PHOS: 74 U/L (ref 39–117)
ALT: 28 U/L (ref 0–53)
AST: 28 U/L (ref 0–37)
Bilirubin, Direct: 0.1 mg/dL (ref 0.0–0.3)
TOTAL PROTEIN: 6.8 g/dL (ref 6.0–8.3)
Total Bilirubin: 0.4 mg/dL (ref 0.2–1.2)

## 2017-01-14 LAB — LIPID PANEL
Cholesterol: 158 mg/dL (ref 0–200)
HDL: 49 mg/dL (ref >39.00)
LDL CALC: 97 mg/dL (ref 0–99)
NonHDL: 109.28
TRIGLYCERIDES: 63 mg/dL (ref 0.0–149.0)
Total CHOL/HDL Ratio: 3
VLDL: 12.6 mg/dL (ref 0.0–40.0)

## 2017-01-14 LAB — BASIC METABOLIC PANEL
BUN: 12 mg/dL (ref 6–23)
CO2: 31 meq/L (ref 19–32)
Calcium: 9.5 mg/dL (ref 8.4–10.5)
Chloride: 104 mEq/L (ref 96–112)
Creatinine, Ser: 0.89 mg/dL (ref 0.40–1.50)
GFR: 114.21 mL/min (ref >60.00)
Glucose, Bld: 148 mg/dL — ABNORMAL HIGH (ref 70–99)
POTASSIUM: 4.5 meq/L (ref 3.5–5.1)
Sodium: 140 mEq/L (ref 135–145)

## 2017-01-14 LAB — URINALYSIS, ROUTINE W REFLEX MICROSCOPIC
Bilirubin Urine: NEGATIVE
HGB URINE DIPSTICK: NEGATIVE
Ketones, ur: NEGATIVE
Leukocytes, UA: NEGATIVE
Nitrite: NEGATIVE
RBC / HPF: NONE SEEN (ref 0–?)
TOTAL PROTEIN, URINE-UPE24: NEGATIVE
Urine Glucose: NEGATIVE
Urobilinogen, UA: 0.2 (ref 0.0–1.0)
pH: 6 (ref 5.0–8.0)

## 2017-01-14 LAB — PSA: PSA: 1.61 ng/mL (ref 0.10–4.00)

## 2017-01-14 LAB — MICROALBUMIN / CREATININE URINE RATIO
CREATININE, U: 250.4 mg/dL
Microalb Creat Ratio: 0.5 mg/g (ref 0.0–30.0)
Microalb, Ur: 1.3 mg/dL (ref 0.0–1.9)

## 2017-01-14 LAB — HEMOGLOBIN A1C: HEMOGLOBIN A1C: 7.4 % — AB (ref 4.6–6.5)

## 2017-01-14 LAB — TSH: TSH: 1.83 u[IU]/mL (ref 0.35–4.50)

## 2017-01-20 ENCOUNTER — Ambulatory Visit (INDEPENDENT_AMBULATORY_CARE_PROVIDER_SITE_OTHER): Payer: 59 | Admitting: Internal Medicine

## 2017-01-20 ENCOUNTER — Encounter: Payer: Self-pay | Admitting: Internal Medicine

## 2017-01-20 VITALS — BP 128/84 | HR 66 | Temp 98.4°F | Ht 74.0 in | Wt 254.0 lb

## 2017-01-20 DIAGNOSIS — E119 Type 2 diabetes mellitus without complications: Secondary | ICD-10-CM

## 2017-01-20 DIAGNOSIS — J338 Other polyp of sinus: Secondary | ICD-10-CM

## 2017-01-20 DIAGNOSIS — Z0000 Encounter for general adult medical examination without abnormal findings: Secondary | ICD-10-CM

## 2017-01-20 DIAGNOSIS — I10 Essential (primary) hypertension: Secondary | ICD-10-CM

## 2017-01-20 DIAGNOSIS — J309 Allergic rhinitis, unspecified: Secondary | ICD-10-CM

## 2017-01-20 DIAGNOSIS — E785 Hyperlipidemia, unspecified: Secondary | ICD-10-CM | POA: Diagnosis not present

## 2017-01-20 DIAGNOSIS — Z0001 Encounter for general adult medical examination with abnormal findings: Secondary | ICD-10-CM

## 2017-01-20 MED ORDER — METFORMIN HCL ER 500 MG PO TB24: ORAL_TABLET | ORAL | 3 refills | Status: DC

## 2017-01-20 MED ORDER — TRIAMCINOLONE ACETONIDE 55 MCG/ACT NA AERO: 2.0000 | INHALATION_SPRAY | Freq: Every day | NASAL | 12 refills | Status: DC

## 2017-01-20 MED ORDER — CETIRIZINE HCL 10 MG PO TABS: 10.0000 mg | ORAL_TABLET | Freq: Every day | ORAL | 11 refills | Status: DC

## 2017-01-20 MED ORDER — LISINOPRIL 20 MG PO TABS: 20.0000 mg | ORAL_TABLET | Freq: Every day | ORAL | 3 refills | Status: DC

## 2017-01-20 MED ORDER — ROSUVASTATIN CALCIUM 20 MG PO TABS: 20.0000 mg | ORAL_TABLET | Freq: Every day | ORAL | 3 refills | Status: DC

## 2017-01-20 NOTE — Progress Notes (Signed)
Subjective:    Patient ID: Howard [name], male    DOB: 14-Jun-1961, 56 y.o.   MRN: [id number]  HPI  Here for wellness and f/u;  Overall doing ok;  Pt denies Chest pain, worsening SOB, DOE, wheezing, orthopnea, PND, worsening LE edema, palpitations, dizziness or syncope.  Pt denies neurological change such as new headache, facial or extremity weakness.  Pt denies polydipsia, polyuria, or low sugar symptoms. Pt states overall good compliance with treatment and medications, good tolerability, and has been trying to follow appropriate diet.  Pt denies worsening depressive symptoms, suicidal ideation or panic. No fever, night sweats, wt loss, loss of appetite, or other constitutional symptoms.  Pt states good ability with ADL's, has low fall risk, home safety reviewed and adequate, no other significant changes in hearing or vision, and only occasionally active with exercise. Only taking 3 metformin instead of 4 prescribed, was trying to work on cutting back on medications.   BP Readings from Last 3 Encounters:  01/20/17 128/84  07/22/16 136/78  06/24/16 118/76  Has callous to left heel, asks for podiatry referral. Decliens immunizations for now.   Has significant pain and difficulty drainage it seems at right maxillary, has hx of sinus polyp, asks for ENT referral Past Medical History:  Diagnosis Date  . Allergic rhinitis, cause unspecified 01/15/2012  . Diabetes mellitus   . DIABETES MELLITUS, TYPE II 05/24/2007   Qualifier: Diagnosis of  By: Dance CMA ([place]), Kim    . HYPERLIPIDEMIA 10/06/2007   Qualifier: Diagnosis of  By: Alain Marion MD, Evie Lacks   . HYPERTENSION 02/06/2008   Qualifier: Diagnosis of  By: Alain Marion MD, Evie Lacks   . Lumbar disc disease    s/p surgury approx 1992  . PSA, INCREASED 05/11/2009   Qualifier: Diagnosis of  By: Ronnald Ramp MD, Arvid Right.    Past Surgical History:  Procedure Laterality Date  . BACK SURGERY     Lumbar/Ruptured Disk  . HERNIA REPAIR  may 2012   left  inguinal  . KNEE ARTHROSCOPY    . NASAL SEPTUM SURGERY      reports that he has never smoked. He has never used smokeless tobacco. He reports that he drinks alcohol. He reports that he does not use drugs. family history includes Cancer in his mother; Diabetes in his father and mother; Hypertension in his father and mother. No Known Allergies Current Outpatient Prescriptions on File Prior to Visit  Medication Sig Dispense Refill  . Blood Glucose Monitoring Suppl (ONE TOUCH ULTRA 2) W/DEVICE KIT Use as directed daily 1 each 0  . cyclobenzaprine (FLEXERIL) 5 MG tablet Take 1 tablet (5 mg total) by mouth 3 (three) times daily as needed for muscle spasms. 60 tablet 1  . glucose blood (ONE TOUCH ULTRA TEST) test strip Use as instructed once daily. Diagnosis 250.0 100 each 12  . Lancets MISC 1 application by Does not apply route daily. 100 each 12  . naproxen (NAPROSYN) 500 MG tablet Take 1 tablet (500 mg total) by mouth 2 (two) times daily with a meal. 60 tablet 0  . traMADol (ULTRAM) 50 MG tablet Take 1 tablet (50 mg total) by mouth every 6 (six) hours as needed. 60 tablet 2   No current facility-administered medications on file prior to visit.     Review of Systems Constitutional: Negative for other unusual diaphoresis, sweats, appetite or weight changes HENT: Negative for other worsening hearing loss, ear pain, facial swelling, mouth sores or neck stiffness.  Eyes: Negative for other worsening pain, redness or other visual disturbance.  Respiratory: Negative for other stridor or swelling Cardiovascular: Negative for other palpitations or other chest pain  Gastrointestinal: Negative for worsening diarrhea or loose stools, blood in stool, distention or other pain Genitourinary: Negative for hematuria, flank pain or other change in urine volume.  Musculoskeletal: Negative for myalgias or other joint swelling.  Skin: Negative for other color change, or other wound or worsening drainage.    Neurological: Negative for other syncope or numbness. Hematological: Negative for other adenopathy or swelling Psychiatric/Behavioral: Negative for hallucinations, other worsening agitation, SI, self-injury, or new decreased concentration All other system neg per pt    Objective:   Physical Exam BP 128/84   Pulse 66   Temp 98.4 F (36.9 C) (Oral)   Ht _0  (1.88 m)   Wt 254 lb (115.2 kg)   SpO2 98%   BMI 32.61 kg/m  VS noted,  Constitutional: Pt is oriented to person, place, and time. Appears well-developed and well-nourished, in no significant distress and comfortable Head: Normocephalic and atraumatic  Eyes: Conjunctivae and EOM are normal. Pupils are equal, round, and reactive to light Right Ear: External ear normal without discharge Left Ear: External ear normal without discharge Nose: Nose without discharge or deformity Mouth/Throat: Oropharynx is without other ulcerations and moist  Neck: Normal range of motion. Neck supple. No JVD present. No tracheal deviation present or significant neck LA or mass Cardiovascular: Normal rate, regular rhythm, normal heart sounds and intact distal pulses.   Pulmonary/Chest: WOB normal and breath sounds without rales or wheezing  Abdominal: Soft. Bowel sounds are normal. NT. No HSM  Musculoskeletal: Normal range of motion. Exhibits no edema Lymphadenopathy: Has no other cervical adenopathy.  Neurological: Pt is alert and oriented to person, place, and time. Pt has normal reflexes. No cranial nerve deficit. Motor grossly intact, Gait intact Skin: Skin is warm and dry. No rash noted or new ulcerations Psychiatric:  Has normal mood and affect. Behavior is normal without agitation No other exam findings    Assessment & Plan:

## 2017-01-20 NOTE — Progress Notes (Signed)
Pre visit review using our clinic review tool, if applicable. No additional management support is needed unless otherwise documented below in the visit note. 

## 2017-01-20 NOTE — Patient Instructions (Signed)
OK to stop the pravastatin  OK to take all 4 of the metfornin per day  Please take all new medication as prescribed - the crestor 20 mg per day, zyrtec as needed, nasacort as directed, You can also take Mucinex (or it's generic off brand) for congestion, and tylenol as needed for pain.  Please continue all other medications as before, and refills have been done if requested.  Please have the pharmacy call with any other refills you may need.  Please continue your efforts at being more active, low cholesterol diet, and weight control.  You are otherwise up to date with prevention measures today.  Please keep your appointments with your specialists as you may have planned  You will be contacted regarding the referral for: podiatry, colonoscopy (july),  And ENT (in 2-3 wks with Dr Ernesto Rutherford)  Please return in 6 months, or sooner if needed, with Lab testing done 3-5 days before\

## 2017-01-22 ENCOUNTER — Telehealth: Payer: Self-pay

## 2017-01-22 NOTE — Telephone Encounter (Signed)
PA for nasacort was denied for pt. Please advise on alternatives.

## 2017-01-22 NOTE — Telephone Encounter (Signed)
Ok to use the OTC then, as it is nearly the same

## 2017-01-23 NOTE — Telephone Encounter (Signed)
Pt informed

## 2017-01-26 NOTE — Assessment & Plan Note (Signed)
stable overall by history and exam, recent data reviewed with pt, and pt to continue medical treatment as before,  to f/u any worsening symptoms or concerns BP Readings from Last 3 Encounters:  01/20/17 128/84  07/22/16 136/78  06/24/16 118/76

## 2017-01-26 NOTE — Assessment & Plan Note (Signed)
stable overall by history and exam, recent data reviewed with pt,  to f/u any worsening symptoms or concerns== Lab Results  Component Value Date   LDLCALC 97 01/14/2017  To change pavatstain to crestor 20 with goal LDL < 70

## 2017-01-26 NOTE — Assessment & Plan Note (Signed)
For ENT referral,  to f/u any worsening symptoms or concerns 

## 2017-01-26 NOTE — Assessment & Plan Note (Signed)
stable overall by history and exam, recent data reviewed with pt, and pt to increase the metformin to 4 qd,  to f/u any worsening symptoms or concerns Lab Results  Component Value Date   HGBA1C 7.4 (H) 01/14/2017  Also for podiatry referral

## 2017-01-26 NOTE — Assessment & Plan Note (Signed)
For zyrtec and nasacort

## 2017-01-26 NOTE — Assessment & Plan Note (Signed)

## 2017-02-11 ENCOUNTER — Ambulatory Visit
Admission: RE | Admit: 2017-02-11 | Discharge: 2017-02-11 | Disposition: A | Discharge status: Home / Self Care | Payer: 59 | Source: Ambulatory Visit | Attending: Otolaryngology | Admitting: Otolaryngology

## 2017-02-11 ENCOUNTER — Other Ambulatory Visit: Payer: Self-pay | Admitting: Otolaryngology

## 2017-02-11 DIAGNOSIS — J322 Chronic ethmoidal sinusitis: Secondary | ICD-10-CM

## 2017-02-12 ENCOUNTER — Encounter: Payer: Self-pay | Admitting: Podiatry

## 2017-02-12 ENCOUNTER — Ambulatory Visit (INDEPENDENT_AMBULATORY_CARE_PROVIDER_SITE_OTHER): Payer: 59 | Admitting: Podiatry

## 2017-02-12 VITALS — BP 131/93 | HR 78

## 2017-02-12 DIAGNOSIS — Q828 Other specified congenital malformations of skin: Secondary | ICD-10-CM | POA: Diagnosis not present

## 2017-02-12 DIAGNOSIS — E119 Type 2 diabetes mellitus without complications: Secondary | ICD-10-CM | POA: Diagnosis not present

## 2017-02-12 NOTE — Progress Notes (Signed)
   Subjective:    Patient ID: Howard [name], male    DOB: 09/18/1961, 56 y.o.   MRN: [id number]  HPI this patient presents the office with chief complaint of a painful skin lesion which is been present for over 1 year. He also says he has a skin lesion noted in the midfoot for 2 years. He says these are painful walking and wearing his shoes. He has provided no self treatment nor sought any professional help. This patient is a diabetic and desires a foot exam as well as an evaluation of the painful skin lesions    Review of Systems  All other systems reviewed and are negative.      Objective:   Physical Exam  GENERAL APPEARANCE: Alert, conversant. Appropriately groomed. No acute distress.  VASCULAR: Pedal pulses are  palpable at  [facility] and PT bilateral.  Capillary refill time is immediate to all digits,  Normal temperature gradient.  Digital hair growth is present bilateral  NEUROLOGIC: sensation is normal to 5.07 monofilament at 5/5 sites bilateral.  Light touch is intact bilateral, Muscle strength normal.  MUSCULOSKELETAL: acceptable muscle strength, tone and stability bilateral.  Intrinsic muscluature intact bilateral.  Rectus appearance of foot and digits noted bilateral.   DERMATOLOGIC: skin color, texture, and turgor are within normal limits.  No preulcerative lesions or ulcers  are seen, no interdigital maceration noted.  No open lesions present.  Digital nails are asymptomatic. No drainage noted.Porokeratosis sub 5th left and sub 5th metabase.         Assessment & Plan:  Porokeratosis  Left foot.  Diabetes  IE  Debridement of porokeratosis left foot.  RTC 1 year. Diabetic foot exam performed.   Gardiner Barefoot DPM

## 2017-02-13 DIAGNOSIS — J4540 Moderate persistent asthma, uncomplicated: Secondary | ICD-10-CM | POA: Diagnosis not present

## 2017-04-23 ENCOUNTER — Encounter: Payer: Self-pay | Admitting: Internal Medicine

## 2017-06-24 ENCOUNTER — Ambulatory Visit (AMBULATORY_SURGERY_CENTER): Payer: Self-pay | Admitting: *Deleted

## 2017-06-24 ENCOUNTER — Encounter: Payer: Self-pay | Admitting: Internal Medicine

## 2017-06-24 VITALS — Ht 74.0 in | Wt 251.2 lb

## 2017-06-24 DIAGNOSIS — Z86010 Personal history of colonic polyps: Secondary | ICD-10-CM

## 2017-06-24 MED ORDER — NA SULFATE-K SULFATE-MG SULF 17.5-3.13-1.6 GM/177ML PO SOLN: ORAL | 0 refills | Status: DC

## 2017-06-24 NOTE — Progress Notes (Signed)
No allergies to eggs or soy. No problems with anesthesia.  Pt given Emmi instructions for colonoscopy  No oxygen use  No diet drug use  

## 2017-07-08 ENCOUNTER — Ambulatory Visit (AMBULATORY_SURGERY_CENTER): Payer: 59 | Admitting: Gastroenterology

## 2017-07-08 ENCOUNTER — Encounter: Payer: Self-pay | Admitting: Gastroenterology

## 2017-07-08 VITALS — BP 108/73 | HR 67 | Temp 97.3°F | Resp 15 | Ht 74.0 in | Wt 251.0 lb

## 2017-07-08 DIAGNOSIS — D124 Benign neoplasm of descending colon: Secondary | ICD-10-CM | POA: Diagnosis not present

## 2017-07-08 DIAGNOSIS — Z86010 Personal history of colonic polyps: Secondary | ICD-10-CM

## 2017-07-08 DIAGNOSIS — K635 Polyp of colon: Secondary | ICD-10-CM

## 2017-07-08 MED ORDER — SODIUM CHLORIDE 0.9 % IV SOLN: 500.0000 mL | INTRAVENOUS | Status: DC

## 2017-07-08 NOTE — Op Note (Signed)
[place] Patient Name: Howard [name] Procedure Date: 07/08/2017 8:52 AM MRN: [id number] Endoscopist: Milus Banister , MD Age: 56 Referring MD:  Date of Birth: [birth year] Gender: Male Account #: [id number] Procedure:                Colonoscopy Indications:              High risk colon cancer surveillance: Personal                            history of colonic polyps; Dr. Hilarie Fredrickson colonoscopy                            03/2013 four polyps removed, one was a 1cm adenoma Medicines:                Monitored Anesthesia Care Procedure:                Pre-Anesthesia Assessment:                           - Prior to the procedure, a History and Physical                            was performed, and patient medications and                            allergies were reviewed. The patient's tolerance of                            previous anesthesia was also reviewed. The risks                            and benefits of the procedure and the sedation                            options and risks were discussed with the patient.                            All questions were answered, and informed consent                            was obtained. Prior Anticoagulants: The patient has                            taken no previous anticoagulant or antiplatelet                            agents. ASA Grade Assessment: II - A patient with                            mild systemic disease. After reviewing the risks                            and benefits, the patient was deemed in  satisfactory condition to undergo the procedure.                           After obtaining informed consent, the colonoscope                            was passed under direct vision. Throughout the                            procedure, the patient's blood pressure, pulse, and                            oxygen saturations were monitored continuously. The                            Colonoscope was  introduced through the anus and                            advanced to the the cecum, identified by                            appendiceal orifice and ileocecal valve. The                            colonoscopy was performed without difficulty. The                            patient tolerated the procedure well. The quality                            of the bowel preparation was excellent. The                            ileocecal valve, appendiceal orifice, and rectum                            were photographed. Scope In: 8:58:00 AM Scope Out: 9:09:48 AM Scope Withdrawal Time: 0 hours 9 minutes 3 seconds  Total Procedure Duration: 0 hours 11 minutes 48 seconds  Findings:                 Two sessile polyps were found in the descending                            colon. The polyps were 6 to 8 mm in size. These                            polyps were removed with a cold snare. Resection                            and retrieval were complete.                           The exam was otherwise without abnormality on  direct and retroflexion views. Complications:            No immediate complications. Estimated blood loss:                            None. Estimated Blood Loss:     Estimated blood loss: none. Impression:               - Two 6 to 8 mm polyps in the descending colon,                            removed with a cold snare. Resected and retrieved.                           - The examination was otherwise normal on direct                            and retroflexion views. Recommendation:           - Patient has a contact number available for                            emergencies. The signs and symptoms of potential                            delayed complications were discussed with the                            patient. Return to normal activities tomorrow.                            Written discharge instructions were provided to the                             patient.                           - Resume previous diet.                           - Continue present medications.                           You will receive a letter within 2-3 weeks with the                            pathology results and my final recommendations.                           If the polyp(s) is proven to be 'pre-cancerous' on                            pathology, you will need repeat colonoscopy in 5                            years. Milus Banister, MD 07/08/2017  9:12:42 AM This report has been signed electronically.

## 2017-07-08 NOTE — Progress Notes (Signed)
Pt's states no medical or surgical changes since previsit or office visit. 

## 2017-07-08 NOTE — Patient Instructions (Signed)
YOU HAD AN ENDOSCOPIC PROCEDURE TODAY AT THE [facility]:   Refer to the procedure report that was given to you for any specific questions about what was found during the examination.  If the procedure report does not answer your questions, please call your gastroenterologist to clarify.  If you requested that your care partner not be given the details of your procedure findings, then the procedure report has been included in a sealed envelope for you to review at your convenience later.  YOU SHOULD EXPECT: Some feelings of bloating in the abdomen. Passage of more gas than usual.  Walking can help get rid of the air that was put into your GI tract during the procedure and reduce the bloating. If you had a lower endoscopy (such as a colonoscopy or flexible sigmoidoscopy) you may notice spotting of blood in your stool or on the toilet paper. If you underwent a bowel prep for your procedure, you may not have a normal bowel movement for a few days.  Please Note:  You might notice some irritation and congestion in your nose or some drainage.  This is from the oxygen used during your procedure.  There is no need for concern and it should clear up in a day or so.  SYMPTOMS TO REPORT IMMEDIATELY:   Following lower endoscopy (colonoscopy or flexible sigmoidoscopy):  Excessive amounts of blood in the stool  Significant tenderness or worsening of abdominal pains  Swelling of the abdomen that is new, acute  Fever of 100F or higher   For urgent or emergent issues, a gastroenterologist can be reached at any hour by calling [phone].   DIET:  We do recommend a small meal at first, but then you may proceed to your regular diet.  Drink plenty of fluids but you should avoid alcoholic beverages for 24 hours.  ACTIVITY:  You should plan to take it easy for the rest of today and you should NOT DRIVE or use heavy machinery until tomorrow (because of the sedation medicines used during the test).     FOLLOW UP: Our staff will call the number listed on your records the next business day following your procedure to check on you and address any questions or concerns that you may have regarding the information given to you following your procedure. If we do not reach you, we will leave a message.  However, if you are feeling well and you are not experiencing any problems, there is no need to return our call.  We will assume that you have returned to your regular daily activities without incident.  If any biopsies were taken you will be contacted by phone or by letter within the next 1-3 weeks.  Please call us at [phone] if you have not heard about the biopsies in 3 weeks.    SIGNATURES/CONFIDENTIALITY: You and/or your care partner have signed paperwork which will be entered into your electronic medical record.  These signatures attest to the fact that that the information above on your After Visit Summary has been reviewed and is understood.  Full responsibility of the confidentiality of this discharge information lies with you and/or your care-partner.  Polyp information given. 

## 2017-07-08 NOTE — Progress Notes (Signed)
Called to room to assist during endoscopic procedure.  Patient ID and intended procedure confirmed with present staff. Received instructions for my participation in the procedure from the performing physician.  

## 2017-07-08 NOTE — Progress Notes (Signed)
Report to PACU, RN, vss, BBS= Clear.  

## 2017-07-09 ENCOUNTER — Telehealth: Payer: Self-pay

## 2017-07-09 NOTE — Telephone Encounter (Signed)
  Follow up Call-  Call back number 07/08/2017  Post procedure Call Back phone  # [phone]  Permission to leave phone message Yes  Some recent data might be hidden     Patient questions:  Do you have a fever, pain , or abdominal swelling? No. Pain Score  0 *  Have you tolerated food without any problems? Yes.    Have you been able to return to your normal activities? Yes.    Do you have any questions about your discharge instructions: Diet   No. Medications  No. Follow up visit  No.  Do you have questions or concerns about your Care? No.  Actions: * If pain score is 4 or above: No action needed, pain <4.

## 2017-07-14 ENCOUNTER — Encounter: Payer: Self-pay | Admitting: Gastroenterology

## 2017-07-17 ENCOUNTER — Other Ambulatory Visit (INDEPENDENT_AMBULATORY_CARE_PROVIDER_SITE_OTHER): Payer: 59

## 2017-07-17 DIAGNOSIS — E119 Type 2 diabetes mellitus without complications: Secondary | ICD-10-CM | POA: Diagnosis not present

## 2017-07-17 LAB — BASIC METABOLIC PANEL
BUN: 9 mg/dL (ref 6–23)
CALCIUM: 9.2 mg/dL (ref 8.4–10.5)
CO2: 30 mEq/L (ref 19–32)
Chloride: 104 mEq/L (ref 96–112)
Creatinine, Ser: 0.84 mg/dL (ref 0.40–1.50)
GFR: 121.86 mL/min (ref >60.00)
GLUCOSE: 135 mg/dL — AB (ref 70–99)
POTASSIUM: 4.1 meq/L (ref 3.5–5.1)
Sodium: 141 mEq/L (ref 135–145)

## 2017-07-17 LAB — HEPATIC FUNCTION PANEL
ALBUMIN: 4.1 g/dL (ref 3.5–5.2)
ALT: 26 U/L (ref 0–53)
AST: 21 U/L (ref 0–37)
Alkaline Phosphatase: 67 U/L (ref 39–117)
Bilirubin, Direct: 0 mg/dL (ref 0.0–0.3)
Total Bilirubin: 0.3 mg/dL (ref 0.2–1.2)
Total Protein: 6.6 g/dL (ref 6.0–8.3)

## 2017-07-17 LAB — HEMOGLOBIN A1C: Hgb A1c MFr Bld: 7.6 % — ABNORMAL HIGH (ref 4.6–6.5)

## 2017-07-17 LAB — LIPID PANEL
Cholesterol: 137 mg/dL (ref 0–200)
HDL: 47.6 mg/dL (ref >39.00)
LDL Cholesterol: 80 mg/dL (ref 0–99)
NonHDL: 89.67
TRIGLYCERIDES: 48 mg/dL (ref 0.0–149.0)
Total CHOL/HDL Ratio: 3
VLDL: 9.6 mg/dL (ref 0.0–40.0)

## 2017-07-22 ENCOUNTER — Ambulatory Visit (INDEPENDENT_AMBULATORY_CARE_PROVIDER_SITE_OTHER): Payer: 59 | Admitting: Internal Medicine

## 2017-07-22 ENCOUNTER — Encounter: Payer: Self-pay | Admitting: Internal Medicine

## 2017-07-22 VITALS — BP 124/86 | HR 62 | Temp 98.2°F | Ht 74.0 in | Wt 253.0 lb

## 2017-07-22 DIAGNOSIS — Z114 Encounter for screening for human immunodeficiency virus [HIV]: Secondary | ICD-10-CM | POA: Diagnosis not present

## 2017-07-22 DIAGNOSIS — R5383 Other fatigue: Secondary | ICD-10-CM | POA: Diagnosis not present

## 2017-07-22 DIAGNOSIS — I10 Essential (primary) hypertension: Secondary | ICD-10-CM

## 2017-07-22 DIAGNOSIS — Z0000 Encounter for general adult medical examination without abnormal findings: Secondary | ICD-10-CM | POA: Diagnosis not present

## 2017-07-22 DIAGNOSIS — E119 Type 2 diabetes mellitus without complications: Secondary | ICD-10-CM | POA: Diagnosis not present

## 2017-07-22 DIAGNOSIS — E785 Hyperlipidemia, unspecified: Secondary | ICD-10-CM

## 2017-07-22 MED ORDER — ROSUVASTATIN CALCIUM 40 MG PO TABS: 40.0000 mg | ORAL_TABLET | Freq: Every day | ORAL | 3 refills | Status: DC

## 2017-07-22 MED ORDER — GLIPIZIDE ER 2.5 MG PO TB24: 2.5000 mg | ORAL_TABLET | Freq: Every day | ORAL | 3 refills | Status: DC

## 2017-07-22 MED ORDER — ASPIRIN 81 MG PO TBEC: 81.0000 mg | DELAYED_RELEASE_TABLET | Freq: Every day | ORAL | 12 refills | Status: DC

## 2017-07-22 NOTE — Assessment & Plan Note (Signed)
Etiology not clear, cant r/o osa but pt declines referral at this time

## 2017-07-22 NOTE — Assessment & Plan Note (Signed)
Goal < 70, ok to increase the crestor 40 qd, to f/u any worsening symptoms or concerns

## 2017-07-22 NOTE — Progress Notes (Signed)
Subjective:    Patient ID: Howard [name], male    DOB: [birth year], 56 y.o.   MRN: [id number]  HPI  Here to f/u; overall doing ok,  Pt denies chest pain, increasing sob or doe, wheezing, orthopnea, PND, increased LE swelling, palpitations, dizziness or syncope.  Pt denies new neurological symptoms such as new headache, or facial or extremity weakness or numbness.  Pt denies polydipsia, polyuria, or low sugar episode.  Pt states overall good compliance with meds, mostly trying to follow appropriate diet, with wt overall stable,  but little exercise however.  Plans to maybe start but has been having some msk pain to the hamstring to the left post upper leg.  Does c/o ongoing fatigue, and has possible signficant daytime hypersomnolence, but declines pulmonary referral BP Readings from Last 3 Encounters:  07/22/17 124/86  07/08/17 108/73  02/12/17 (!) 131/93   Wt Readings from Last 3 Encounters:  07/22/17 253 lb (114.8 kg)  07/08/17 251 lb (113.9 kg)  06/24/17 251 lb 3.2 oz (113.9 kg)  Declines immunizations today.   Past Medical History:  Diagnosis Date  . Allergic rhinitis, cause unspecified 01/15/2012  . Allergy   . Diabetes mellitus   . DIABETES MELLITUS, TYPE II 05/24/2007   Qualifier: Diagnosis of  By: Dance CMA ([place]), Kim    . HYPERLIPIDEMIA 10/06/2007   Qualifier: Diagnosis of  By: Alain Marion MD, Evie Lacks   . HYPERTENSION 02/06/2008   Qualifier: Diagnosis of  By: Alain Marion MD, Evie Lacks   . Lumbar disc disease    s/p surgury approx 1992  . PSA, INCREASED 05/11/2009   Qualifier: Diagnosis of  By: Ronnald Ramp MD, Arvid Right.    Past Surgical History:  Procedure Laterality Date  . BACK SURGERY  1989   Lumbar/Ruptured Disk  . HERNIA REPAIR  may 2012   left inguinal  . KNEE ARTHROSCOPY Right 2014  . NASAL SEPTUM SURGERY      reports that he has never smoked. He has never used smokeless tobacco. He reports that he drinks alcohol. He reports that he does not use drugs. family history  includes Cancer in his mother; Diabetes in his father and mother; Hypertension in his father and mother. No Known Allergies Current Outpatient Prescriptions on File Prior to Visit  Medication Sig Dispense Refill  . Blood Glucose Monitoring Suppl (ONE TOUCH ULTRA 2) W/DEVICE KIT Use as directed daily 1 each 0  . glucose blood (ONE TOUCH ULTRA TEST) test strip Use as instructed once daily. Diagnosis 250.0 100 each 12  . Lancets MISC 1 application by Does not apply route daily. 100 each 12  . lisinopril (PRINIVIL,ZESTRIL) 20 MG tablet Take 1 tablet (20 mg total) by mouth daily. 90 tablet 3  . metFORMIN (GLUCOPHAGE-XR) 500 MG 24 hr tablet TAKE FOUR TABLETS BY MOUTH ONCE DAILY 360 tablet 3   No current facility-administered medications on file prior to visit.     Review of Systems   Constitutional: Negative for other unusual diaphoresis or sweats HENT: Negative for ear discharge or swelling Eyes: Negative for other worsening visual disturbances Respiratory: Negative for stridor or other swelling  Gastrointestinal: Negative for worsening distension or other blood Genitourinary: Negative for retention or other urinary change Musculoskeletal: Negative for other MSK pain or swelling Skin: Negative for color change or other new lesions Neurological: Negative for worsening tremors and other numbness  Psychiatric/Behavioral: Negative for worsening agitation or other fatigue All other system neg per pt     Objective:  Physical Exam BP 124/86   Pulse 62   Temp 98.2 F (36.8 C) (Oral)   Ht 6' 2"  (1.88 m)   Wt 253 lb (114.8 kg)   SpO2 99%   BMI 32.48 kg/m  VS noted,  Constitutional: Pt appears in NAD HENT: Head: NCAT.  Right Ear: External ear normal.  Left Ear: External ear normal.  Eyes: . Pupils are equal, round, and reactive to light. Conjunctivae and EOM are normal Nose: without d/c or deformity Neck: Neck supple. Gross normal ROM Cardiovascular: Normal rate and regular rhythm.     Pulmonary/Chest: Effort normal and breath sounds without rales or wheezing.  Left knee NT including the hamstring insertion sites Neurological: Pt is alert. At baseline orientation, motor grossly intact Skin: Skin is warm. No rashes, other new lesions, no LE edema Psychiatric: Pt behavior is normal without agitation  No other exam findings  Lab Results  Component Value Date   WBC 4.1 01/14/2017   HGB 13.9 01/14/2017   HCT 42.5 01/14/2017   PLT 218.0 01/14/2017   GLUCOSE 135 (H) 07/17/2017   CHOL 137 07/17/2017   TRIG 48.0 07/17/2017   HDL 47.60 07/17/2017   LDLDIRECT 127.5 04/02/2007   LDLCALC 80 07/17/2017   ALT 26 07/17/2017   AST 21 07/17/2017   NA 141 07/17/2017   K 4.1 07/17/2017   CL 104 07/17/2017   CREATININE 0.84 07/17/2017   BUN 9 07/17/2017   CO2 30 07/17/2017   TSH 1.83 01/14/2017   PSA 1.61 01/14/2017   HGBA1C 7.6 (H) 07/17/2017   MICROALBUR 1.3 01/14/2017       Assessment & Plan:

## 2017-07-22 NOTE — Assessment & Plan Note (Signed)
Mild uncontrolled, to add glipizide ER 2.5 qd, refer DM education,  to f/u any worsening symptoms or concerns

## 2017-07-22 NOTE — Assessment & Plan Note (Signed)
stable overall by history and exam, recent data reviewed with pt, and pt to continue medical treatment as before,  to f/u any worsening symptoms or concerns BP Readings from Last 3 Encounters:  07/22/17 124/86  07/08/17 108/73  02/12/17 (!) 131/93

## 2017-07-22 NOTE — Patient Instructions (Addendum)
Please start aspirin 81 mg - 1 per day (coated only)  Please take all new medication as prescribed - the glipizide ER 2.5 mg per day  Please continue all other medications as before, including the metformin  OK to increase the crestor to 40 mg daily  Please have the pharmacy call with any other refills you may need.  Please continue your efforts at being more active, low cholesterol diabetic diet, and weight control.  Please keep your appointments with your specialists as you may have planned  You will be contacted regarding the referral for: Diabetes Education classes  Please call if you change your mind about the referral to pulmonary to look in to possible sleep apnea  Please make an appointment with Sports Medicine in this office if the left leg pain persists or gets worse  Please return in 6 months, or sooner if needed, with Lab testing done 3-5 days before

## 2017-08-21 ENCOUNTER — Ambulatory Visit (INDEPENDENT_AMBULATORY_CARE_PROVIDER_SITE_OTHER): Payer: 59 | Admitting: Endocrinology

## 2017-08-21 ENCOUNTER — Encounter: Payer: Self-pay | Admitting: Endocrinology

## 2017-08-21 VITALS — BP 148/88 | HR 65 | Wt 248.6 lb

## 2017-08-21 DIAGNOSIS — E119 Type 2 diabetes mellitus without complications: Secondary | ICD-10-CM | POA: Diagnosis not present

## 2017-08-21 DIAGNOSIS — E041 Nontoxic single thyroid nodule: Secondary | ICD-10-CM | POA: Diagnosis not present

## 2017-08-21 MED ORDER — LINAGLIPTIN 5 MG PO TABS: 5.0000 mg | ORAL_TABLET | Freq: Every day | ORAL | 11 refills | Status: DC

## 2017-08-21 NOTE — Progress Notes (Signed)
Subjective:    Patient ID: Howard [name], male    DOB: 1961-06-23, 56 y.o.   MRN: [id number]  HPI pt is referred by Dr Jenny Reichmann, for diabetes.  Pt states DM was dx'ed in 2008; he has mild if any neuropathy of the lower extremities; he is unaware of any associated chronic complications; he has never been on insulin; pt says his diet and exercise are fair; he has never had pancreatitis, pancreatic surgery, severe hypoglycemia or DKA.  He says cbg's vary from 150-200.  He takes metformin only.   Past Medical History:  Diagnosis Date  . Allergic rhinitis, cause unspecified 01/15/2012  . Allergy   . Diabetes mellitus   . DIABETES MELLITUS, TYPE II 05/24/2007   Qualifier: Diagnosis of  By: Dance CMA ([place]), Kim    . HYPERLIPIDEMIA 10/06/2007   Qualifier: Diagnosis of  By: Alain Marion MD, Evie Lacks   . HYPERTENSION 02/06/2008   Qualifier: Diagnosis of  By: Alain Marion MD, Evie Lacks   . Lumbar disc disease    s/p surgury approx 1992  . PSA, INCREASED 05/11/2009   Qualifier: Diagnosis of  By: Ronnald Ramp MD, Arvid Right.     Past Surgical History:  Procedure Laterality Date  . BACK SURGERY  1989   Lumbar/Ruptured Disk  . HERNIA REPAIR  may 2012   left inguinal  . KNEE ARTHROSCOPY Right 2014  . NASAL SEPTUM SURGERY      Social History   Social History  . Marital status: Married    Spouse name: N/A  . Number of children: N/A  . Years of education: N/A   Occupational History  . Not on file.   Social History Main Topics  . Smoking status: Never Smoker  . Smokeless tobacco: Never Used  . Alcohol use Yes     Comment: very rare   . Drug use: No  . Sexual activity: Not on file   Other Topics Concern  . Not on file   Social History Narrative  . No narrative on file    Current Outpatient Prescriptions on File Prior to Visit  Medication Sig Dispense Refill  . aspirin 81 MG EC tablet Take 1 tablet (81 mg total) by mouth daily. Swallow whole. 30 tablet 12  . Blood Glucose Monitoring Suppl (ONE  TOUCH ULTRA 2) W/DEVICE KIT Use as directed daily 1 each 0  . glucose blood (ONE TOUCH ULTRA TEST) test strip Use as instructed once daily. Diagnosis 250.0 100 each 12  . Lancets MISC 1 application by Does not apply route daily. 100 each 12  . lisinopril (PRINIVIL,ZESTRIL) 20 MG tablet Take 1 tablet (20 mg total) by mouth daily. 90 tablet 3  . metFORMIN (GLUCOPHAGE-XR) 500 MG 24 hr tablet TAKE FOUR TABLETS BY MOUTH ONCE DAILY 360 tablet 3  . rosuvastatin (CRESTOR) 40 MG tablet Take 1 tablet (40 mg total) by mouth daily. 90 tablet 3   No current facility-administered medications on file prior to visit.     No Known Allergies  Family History  Problem Relation Age of Onset  . Hypertension Mother   . Diabetes Mother   . Cancer Mother   . Hypertension Father   . Diabetes Father   . Colon cancer Neg Hx     BP (!) 148/88   Pulse 65   Wt 248 lb 9.6 oz (112.8 kg)   SpO2 98%   BMI 31.92 kg/m     Review of Systems denies weight loss, blurry vision, headache, chest pain,  sob, n/v, urinary frequency, muscle cramps, excessive diaphoresis, memory loss, depression, cold intolerance, rhinorrhea, and easy bruising.       Objective:   Physical Exam VS: see vs page GEN: no distress HEAD: head: no deformity eyes: no periorbital swelling, no proptosis external nose and ears are normal mouth: no lesion seen NECK: 2 cm right thyroid nodule CHEST WALL: no deformity LUNGS: clear to auscultation CV: reg rate and rhythm, no murmur ABD: abdomen is soft, nontender.  no hepatosplenomegaly.  not distended.  Self-reducing ventral hernia MUSCULOSKELETAL: muscle bulk and strength are grossly normal.  no obvious joint swelling.  gait is normal and steady EXTEMITIES: no deformity.  no ulcer on the feet.  feet are of normal color and temp.  no edema.  There is bilateral onychomycosis of the toenails PULSES: dorsalis pedis intact bilat.  no carotid bruit NEURO:  cn 2-12 grossly intact.   readily moves  all 4's.  sensation is intact to touch on the feet SKIN:  Normal texture and temperature.  No rash or suspicious lesion is visible.   NODES:  None palpable at the neck.  PSYCH: alert, well-oriented.  Does not appear anxious nor depressed.   Lab Results  Component Value Date   TSH 1.83 01/14/2017     Lab Results  Component Value Date   HGBA1C 7.6 (H) 07/17/2017   Lab Results  Component Value Date   CREATININE 0.84 07/17/2017   BUN 9 07/17/2017   NA 141 07/17/2017   K 4.1 07/17/2017   CL 104 07/17/2017   CO2 30 07/17/2017   I personally reviewed electrocardiogram tracing (01/15/16): Indication: wellness Impression: NSR.  No MI.  No hypertrophy. Compared to 2014: no significant change     Assessment & Plan:  Type 2 DM, with polyneuropathy: he needs increased rx.  Thyroid nodule, new.   Patient Instructions  good diet and exercise significantly improve the control of your diabetes.  please let me know if you wish to be referred to a dietician.  high blood sugar is very risky to your health.  you should see an eye doctor and dentist every year.  It is very important to get all recommended vaccinations.  Controlling your blood pressure and cholesterol drastically reduces the damage diabetes does to your body.  Those who smoke should quit.  Please discuss these with your doctor.  check your blood sugar once a day.  vary the time of day when you check, between before the 3 meals, and at bedtime.  also check if you have symptoms of your blood sugar being too high or too low.  please keep a record of the readings and bring it to your next appointment here (or you can bring the meter itself).  You can write it on any piece of paper.  please call us sooner if your blood sugar goes below 70, or if you have a lot of readings over 200.   Let's check the thyroid ultrasound.  you will receive a phone call, about a day and time for an appointment.   I have sent a prescription to your pharmacy, to  add "Tradjenta."  Please come back for a follow-up appointment in 3 months.

## 2017-08-21 NOTE — Patient Instructions (Addendum)
good diet and exercise significantly improve the control of your diabetes.  please let me know if you wish to be referred to a dietician.  high blood sugar is very risky to your health.  you should see an eye doctor and dentist every year.  It is very important to get all recommended vaccinations.  Controlling your blood pressure and cholesterol drastically reduces the damage diabetes does to your body.  Those who smoke should quit.  Please discuss these with your doctor.  check your blood sugar once a day.  vary the time of day when you check, between before the 3 meals, and at bedtime.  also check if you have symptoms of your blood sugar being too high or too low.  please keep a record of the readings and bring it to your next appointment here (or you can bring the meter itself).  You can write it on any piece of paper.  please call us sooner if your blood sugar goes below 70, or if you have a lot of readings over 200.   Let's check the thyroid ultrasound.  you will receive a phone call, about a day and time for an appointment.   I have sent a prescription to your pharmacy, to add "Tradjenta."  Please come back for a follow-up appointment in 3 months.

## 2017-09-14 ENCOUNTER — Ambulatory Visit
Admission: RE | Admit: 2017-09-14 | Discharge: 2017-09-14 | Disposition: A | Discharge status: Home / Self Care | Payer: 59 | Source: Ambulatory Visit | Attending: Endocrinology | Admitting: Endocrinology

## 2017-09-14 DIAGNOSIS — E041 Nontoxic single thyroid nodule: Secondary | ICD-10-CM

## 2017-11-20 ENCOUNTER — Ambulatory Visit: Payer: 59 | Admitting: Internal Medicine

## 2017-11-20 ENCOUNTER — Encounter: Payer: Self-pay | Admitting: Internal Medicine

## 2017-11-20 ENCOUNTER — Ambulatory Visit: Payer: 59 | Admitting: Endocrinology

## 2017-11-20 DIAGNOSIS — B9789 Other viral agents as the cause of diseases classified elsewhere: Secondary | ICD-10-CM | POA: Diagnosis not present

## 2017-11-20 DIAGNOSIS — Z0289 Encounter for other administrative examinations: Secondary | ICD-10-CM

## 2017-11-20 DIAGNOSIS — J069 Acute upper respiratory infection, unspecified: Secondary | ICD-10-CM | POA: Insufficient documentation

## 2017-11-20 MED ORDER — BENZONATATE 100 MG PO CAPS: 100.0000 mg | ORAL_CAPSULE | Freq: Three times a day (TID) | ORAL | 0 refills | Status: DC | PRN

## 2017-11-20 NOTE — Assessment & Plan Note (Signed)
Rx for tessalon perles for cough. Advised not to take any cold medicine with sudafed or phenylephrine due to high BP. Advised to start taking zyrtec daily and supportive care. No indication for antibiotics or steroids today.

## 2017-11-20 NOTE — Progress Notes (Signed)
   Subjective:    Patient ID: Howard [name], male    DOB: [birth year], 57 y.o.   MRN: [id number]  HPI The patient is a 57 YO man coming in for cold symptoms. Started about 4-5 days ago. He is having mild worsening of symptoms. Still doing normal activities and working. Having some cough non-productive and hoarse voice. Denies fevers or chills. No headaches. Having nose drainage and congestion. Taking alka seltzer cold at night time which is helping him sleep. Has some sick contacts. No muscle aches.  Review of Systems  Constitutional: Positive for activity change and appetite change. Negative for chills, fatigue, fever and unexpected weight change.  HENT: Positive for congestion, postnasal drip, rhinorrhea and sinus pressure. Negative for ear discharge, ear pain, sinus pain, sneezing, sore throat, tinnitus, trouble swallowing and voice change.   Eyes: Negative.   Respiratory: Positive for cough. Negative for chest tightness, shortness of breath and wheezing.   Cardiovascular: Negative.   Gastrointestinal: Negative.   Neurological: Negative.       Objective:   Physical Exam  Constitutional: He is oriented to person, place, and time. He appears well-developed and well-nourished.  HENT:  Head: Normocephalic and atraumatic.  Oropharynx with redness and clear drainage, nose with swollen turbinates, TMs normal bilaterally  Eyes: EOM are normal.  Neck: Normal range of motion. No thyromegaly present.  Cardiovascular: Normal rate and regular rhythm.  Pulmonary/Chest: Effort normal and breath sounds normal. No respiratory distress. He has no wheezes. He has no rales.  Abdominal: Soft.  Lymphadenopathy:    He has no cervical adenopathy.  Neurological: He is alert and oriented to person, place, and time.  Skin: Skin is warm and dry.   Vitals:   11/20/17 1156  BP: 138/84  Pulse: 78  Temp: 98.8 F (37.1 C)  TempSrc: Oral  SpO2: 98%  Weight: 252 lb (114.3 kg)  Height: 6\' 2"  (1.88 m)     Assessment & Plan:

## 2017-11-20 NOTE — Patient Instructions (Signed)
We have sent in the cough medicine tessalon perles that you can take 1 pill up to 3 times per day.  We will have you take cetirizine 1 pill daily.   Call us back on Tuesday if you are not feeling at least 50% better.

## 2017-12-21 LAB — HM DIABETES EYE EXAM

## 2018-01-19 ENCOUNTER — Emergency Department (HOSPITAL_COMMUNITY): Payer: 59

## 2018-01-19 ENCOUNTER — Emergency Department (HOSPITAL_COMMUNITY)
Admission: EM | Admit: 2018-01-19 | Discharge: 2018-01-19 | Disposition: A | Discharge status: Home / Self Care | Payer: 59 | Attending: Physician Assistant | Admitting: Physician Assistant

## 2018-01-19 ENCOUNTER — Other Ambulatory Visit (INDEPENDENT_AMBULATORY_CARE_PROVIDER_SITE_OTHER): Payer: 59

## 2018-01-19 ENCOUNTER — Encounter (HOSPITAL_COMMUNITY): Payer: Self-pay | Admitting: Emergency Medicine

## 2018-01-19 DIAGNOSIS — K8020 Calculus of gallbladder without cholecystitis without obstruction: Secondary | ICD-10-CM

## 2018-01-19 DIAGNOSIS — E119 Type 2 diabetes mellitus without complications: Secondary | ICD-10-CM | POA: Insufficient documentation

## 2018-01-19 DIAGNOSIS — Z7984 Long term (current) use of oral hypoglycemic drugs: Secondary | ICD-10-CM | POA: Diagnosis not present

## 2018-01-19 DIAGNOSIS — Z0000 Encounter for general adult medical examination without abnormal findings: Secondary | ICD-10-CM

## 2018-01-19 DIAGNOSIS — R1011 Right upper quadrant pain: Secondary | ICD-10-CM | POA: Diagnosis present

## 2018-01-19 DIAGNOSIS — Z114 Encounter for screening for human immunodeficiency virus [HIV]: Secondary | ICD-10-CM

## 2018-01-19 DIAGNOSIS — R748 Abnormal levels of other serum enzymes: Secondary | ICD-10-CM | POA: Insufficient documentation

## 2018-01-19 DIAGNOSIS — I10 Essential (primary) hypertension: Secondary | ICD-10-CM | POA: Insufficient documentation

## 2018-01-19 DIAGNOSIS — Z79899 Other long term (current) drug therapy: Secondary | ICD-10-CM | POA: Insufficient documentation

## 2018-01-19 LAB — BASIC METABOLIC PANEL
BUN: 12 mg/dL (ref 6–23)
CHLORIDE: 106 meq/L (ref 96–112)
CO2: 27 meq/L (ref 19–32)
Calcium: 9.5 mg/dL (ref 8.4–10.5)
Creatinine, Ser: 0.78 mg/dL (ref 0.40–1.50)
GFR: 132.49 mL/min (ref >60.00)
GLUCOSE: 107 mg/dL — AB (ref 70–99)
POTASSIUM: 4.1 meq/L (ref 3.5–5.1)
Sodium: 142 mEq/L (ref 135–145)

## 2018-01-19 LAB — COMPREHENSIVE METABOLIC PANEL
ALBUMIN: 4 g/dL (ref 3.5–5.0)
ALT: 220 U/L — ABNORMAL HIGH (ref 17–63)
ANION GAP: 9 (ref 5–15)
AST: 392 U/L — ABNORMAL HIGH (ref 15–41)
Alkaline Phosphatase: 112 U/L (ref 38–126)
BUN: 11 mg/dL (ref 6–20)
CALCIUM: 9.3 mg/dL (ref 8.9–10.3)
CHLORIDE: 104 mmol/L (ref 101–111)
CO2: 27 mmol/L (ref 22–32)
Creatinine, Ser: 0.72 mg/dL (ref 0.61–1.24)
GFR calc Af Amer: >60 mL/min (ref >60)
GFR calc non Af Amer: >60 mL/min (ref >60)
GLUCOSE: 89 mg/dL (ref 65–99)
POTASSIUM: 4.1 mmol/L (ref 3.5–5.1)
SODIUM: 140 mmol/L (ref 135–145)
Total Bilirubin: 0.5 mg/dL (ref 0.3–1.2)
Total Protein: 7.2 g/dL (ref 6.5–8.1)

## 2018-01-19 LAB — HEPATIC FUNCTION PANEL
ALT: 21 U/L (ref 0–53)
AST: 22 U/L (ref 0–37)
Albumin: 4 g/dL (ref 3.5–5.2)
Alkaline Phosphatase: 78 U/L (ref 39–117)
BILIRUBIN TOTAL: 0.4 mg/dL (ref 0.2–1.2)
Bilirubin, Direct: 0.1 mg/dL (ref 0.0–0.3)
Total Protein: 6.6 g/dL (ref 6.0–8.3)

## 2018-01-19 LAB — CBC WITH DIFFERENTIAL/PLATELET
BASOS PCT: 0.2 % (ref 0.0–3.0)
Basophils Absolute: 0 10*3/uL (ref 0.0–0.1)
Basophils Absolute: 0 10*3/uL (ref 0.0–0.1)
Basophils Relative: 0 %
EOS ABS: 0.1 10*3/uL (ref 0.0–0.7)
EOS ABS: 0.1 10*3/uL (ref 0.0–0.7)
Eosinophils Relative: 1.6 % (ref 0.0–5.0)
Eosinophils Relative: 1 %
HCT: 39.6 % (ref 39.0–52.0)
HEMATOCRIT: 40.6 % (ref 39.0–52.0)
HEMOGLOBIN: 13 g/dL (ref 13.0–17.0)
Hemoglobin: 13.2 g/dL (ref 13.0–17.0)
LYMPHS PCT: 27 %
Lymphocytes Relative: 27.4 % (ref 12.0–46.0)
Lymphs Abs: 1.2 10*3/uL (ref 0.7–4.0)
Lymphs Abs: 1.1 10*3/uL (ref 0.7–4.0)
MCH: 27.4 pg (ref 26.0–34.0)
MCHC: 33.3 g/dL (ref 30.0–36.0)
MCHC: 32 g/dL (ref 30.0–36.0)
MCV: 83.1 fl (ref 78.0–100.0)
MCV: 85.7 fL (ref 78.0–100.0)
MONO ABS: 0.6 10*3/uL (ref 0.1–1.0)
Monocytes Absolute: 0.4 10*3/uL (ref 0.1–1.0)
Monocytes Relative: 13.4 % — ABNORMAL HIGH (ref 3.0–12.0)
Monocytes Relative: 10 %
NEUTROS ABS: 2.6 10*3/uL (ref 1.7–7.7)
NEUTROS PCT: 57.4 % (ref 43.0–77.0)
NEUTROS PCT: 62 %
Neutro Abs: 2.6 10*3/uL (ref 1.4–7.7)
PLATELETS: 208 10*3/uL (ref 150.0–400.0)
Platelets: 206 10*3/uL (ref 150–400)
RBC: 4.77 Mil/uL (ref 4.22–5.81)
RBC: 4.74 MIL/uL (ref 4.22–5.81)
RDW: 14.1 % (ref 11.5–15.5)
RDW: 13.5 % (ref 11.5–15.5)
WBC: 4.5 10*3/uL (ref 4.0–10.5)
WBC: 4.2 10*3/uL (ref 4.0–10.5)

## 2018-01-19 LAB — URINALYSIS, ROUTINE W REFLEX MICROSCOPIC
Bilirubin Urine: NEGATIVE
HGB URINE DIPSTICK: NEGATIVE
Ketones, ur: NEGATIVE
Leukocytes, UA: NEGATIVE
Nitrite: NEGATIVE
PH: 5.5 (ref 5.0–8.0)
RBC / HPF: NONE SEEN (ref 0–?)
Specific Gravity, Urine: >=1.03 — AB (ref 1.000–1.030)
TOTAL PROTEIN, URINE-UPE24: NEGATIVE
Urine Glucose: NEGATIVE
Urobilinogen, UA: 0.2 (ref 0.0–1.0)

## 2018-01-19 LAB — CBG MONITORING, ED: Glucose-Capillary: 61 mg/dL — ABNORMAL LOW (ref 65–99)

## 2018-01-19 LAB — LIPID PANEL
CHOL/HDL RATIO: 2
Cholesterol: 103 mg/dL (ref 0–200)
HDL: 46.5 mg/dL (ref >39.00)
LDL CALC: 50 mg/dL (ref 0–99)
NONHDL: 56.99
Triglycerides: 37 mg/dL (ref 0.0–149.0)
VLDL: 7.4 mg/dL (ref 0.0–40.0)

## 2018-01-19 LAB — MICROALBUMIN / CREATININE URINE RATIO
Creatinine,U: 222.3 mg/dL
MICROALB UR: 3.6 mg/dL — AB (ref 0.0–1.9)
Microalb Creat Ratio: 1.6 mg/g (ref 0.0–30.0)

## 2018-01-19 LAB — I-STAT TROPONIN, ED
TROPONIN I, POC: 0.01 ng/mL (ref 0.00–0.08)
TROPONIN I, POC: 0 ng/mL (ref 0.00–0.08)

## 2018-01-19 LAB — HEMOGLOBIN A1C: HEMOGLOBIN A1C: 7.2 % — AB (ref 4.6–6.5)

## 2018-01-19 LAB — LIPASE, BLOOD: Lipase: 50 U/L (ref 11–51)

## 2018-01-19 LAB — TSH: TSH: 1.72 u[IU]/mL (ref 0.35–4.50)

## 2018-01-19 LAB — PSA: PSA: 1.94 ng/mL (ref 0.10–4.00)

## 2018-01-19 NOTE — Discharge Instructions (Signed)
You likely need her gallbladder removed.  As we discussed please return if any fever, increasing right upper quadrant pain nausea or vomiting.  Please eat simple foods, we provided a information about diet.  You will need to call surgery in the morning to schedule outpatient follow-up.

## 2018-01-19 NOTE — ED Notes (Signed)
MD notified of CBG. Patient given orange juice and crackers with peanut butter.

## 2018-01-19 NOTE — ED Triage Notes (Signed)
Pt c/o right chest pain and RUQ pain that started this morning after eating. Denies any n/v at this time.

## 2018-01-19 NOTE — ED Provider Notes (Signed)
+++++ [facility] DEPT Provider Note   CSN: [id number] Arrival date & time: 01/19/18  1309     History   Chief Complaint Chief Complaint  Patient presents with  . Chest Pain  . RUQ pain    HPI JOHNATHA ZEIDMAN is a 57 y.o. male.  HPI   Patient is a 57 year old male with past medical history significant for hypertension diabetes and hyperlipidemia presenting with right-sided chest burning.  Patient reports at 15 AM he developed some burning to his right shoulder.  Then migrated to his right upper quadrant.  Not associated with diaphoresis, did not migrate, no nausea.  Patient reports that resolved about 3 hours later.  Patient has no risk factors for PE including no prolonged immobility, swelling.    Past Medical History:  Diagnosis Date  . Allergic rhinitis, cause unspecified 01/15/2012  . Allergy   . Diabetes mellitus   . DIABETES MELLITUS, TYPE II 05/24/2007   Qualifier: Diagnosis of  By: Dance CMA ([place]), Kim    . HYPERLIPIDEMIA 10/06/2007   Qualifier: Diagnosis of  By: Alain Marion MD, Evie Lacks   . HYPERTENSION 02/06/2008   Qualifier: Diagnosis of  By: Alain Marion MD, Evie Lacks   . Lumbar disc disease    s/p surgury approx 1992  . PSA, INCREASED 05/11/2009   Qualifier: Diagnosis of  By: Ronnald Ramp MD, Arvid Right.     Patient Active Problem List   Diagnosis Date Noted  . Viral URI with cough 11/20/2017  . Right thyroid nodule 08/21/2017  . Fatigue 07/22/2017  . Nasal sinus polyp 01/20/2017  . Right lumbar radiculopathy 06/24/2016  . Pain involving joint of finger of left hand 01/15/2016  . Right knee pain 04/08/2012  . Preventative health care 01/15/2012  . Allergic rhinitis 01/15/2012  . Lumbar disc disease   . WEIGHT LOSS, ABNORMAL 05/10/2009  . Essential hypertension 02/06/2008  . Hyperlipidemia 10/06/2007  . Backache 09/11/2007  . Diabetes ([place]) 05/24/2007    Past Surgical History:  Procedure Laterality Date  . BACK SURGERY  1989   Lumbar/Ruptured Disk  . HERNIA REPAIR  may 2012   left inguinal  . KNEE ARTHROSCOPY Right 2014  . NASAL SEPTUM SURGERY          Home Medications    Prior to Admission medications   Medication Sig Start Date End Date Taking? Authorizing Provider  lisinopril (PRINIVIL,ZESTRIL) 20 MG tablet Take 1 tablet (20 mg total) by mouth daily. 01/20/17  Yes Biagio Borg, MD  metFORMIN (GLUCOPHAGE-XR) 500 MG 24 hr tablet TAKE FOUR TABLETS BY MOUTH ONCE DAILY 01/20/17  Yes Biagio Borg, MD  rosuvastatin (CRESTOR) 40 MG tablet Take 1 tablet (40 mg total) by mouth daily. 07/22/17  Yes Biagio Borg, MD  aspirin 81 MG EC tablet Take 1 tablet (81 mg total) by mouth daily. Swallow whole. 07/22/17   Biagio Borg, MD  benzonatate (TESSALON) 100 MG capsule Take 1 capsule (100 mg total) by mouth 3 (three) times daily as needed for cough. 11/20/17   Hoyt Koch, MD  Blood Glucose Monitoring Suppl (ONE TOUCH ULTRA 2) W/DEVICE KIT Use as directed daily 08/16/15   Biagio Borg, MD  glucose blood (ONE TOUCH ULTRA TEST) test strip Use as instructed once daily. Diagnosis 250.0 08/16/15   Biagio Borg, MD  Lancets MISC 1 application by Does not apply route daily. 02/10/13   Biagio Borg, MD  linagliptin (TRADJENTA) 5 MG TABS tablet Take 1 tablet (  5 mg total) by mouth daily. 08/21/17   Renato Shin, MD    Family History Family History  Problem Relation Age of Onset  . Hypertension Mother   . Diabetes Mother   . Cancer Mother   . Hypertension Father   . Diabetes Father   . Colon cancer Neg Hx     Social History Social History   Tobacco Use  . Smoking status: Never Smoker  . Smokeless tobacco: Never Used  Substance Use Topics  . Alcohol use: Yes    Comment: very rare   . Drug use: No     Allergies   Patient has no known allergies.   Review of Systems Review of Systems  Constitutional: Negative for activity change, fatigue and fever.  Respiratory: Negative for shortness of breath.     Cardiovascular: Positive for chest pain.  Gastrointestinal: Negative for abdominal pain.  All other systems reviewed and are negative.    Physical Exam Updated Vital Signs BP (!) 142/94   Pulse (!) 54   Temp 98.5 F (36.9 C) (Oral)   Resp 19   Ht 6' 2"  (1.88 m)   Wt 117 kg (258 lb)   SpO2 100%   BMI 33.13 kg/m   Physical Exam  Constitutional: He is oriented to person, place, and time. He appears well-nourished.  HENT:  Head: Normocephalic.  Eyes: Conjunctivae are normal.  Cardiovascular: Normal rate, intact distal pulses and normal pulses.  Pulmonary/Chest: Effort normal and breath sounds normal. No accessory muscle usage. No respiratory distress.  Neurological: He is oriented to person, place, and time.  Skin: Skin is warm and dry. He is not diaphoretic.  Psychiatric: He has a normal mood and affect. His behavior is normal.  Nursing note and vitals reviewed.    ED Treatments / Results  Labs (all labs ordered are listed, but only abnormal results are displayed) Labs Reviewed  COMPREHENSIVE METABOLIC PANEL - Abnormal; Notable for the following components:      Result Value   AST 392 (*)    ALT 220 (*)    All other components within normal limits  CBG MONITORING, ED - Abnormal; Notable for the following components:   Glucose-Capillary 61 (*)    All other components within normal limits  LIPASE, BLOOD  CBC WITH DIFFERENTIAL/PLATELET  HEPATITIS PANEL, ACUTE  I-STAT TROPONIN, ED  I-STAT TROPONIN, ED    EKG EKG Interpretation  Date/Time:  Tuesday January 19 2018 18:06:38 EDT Ventricular Rate:  50 PR Interval:    QRS Duration: 108 QT Interval:  429 QTC Calculation: 392 R Axis:   1 Text Interpretation:  Sinus rhythm Abnormal R-wave progression, early transition Normal sinus rhythm no signs of ischemia.  Confirmed by Zenovia Jarred [phone]) on 01/19/2018 6:11:44 PM   Radiology Dg Chest 2 View  Result Date: 01/19/2018 CLINICAL DATA:  Onset of right greater  than left chest pain earlier today. Never smoked. History of diabetes and hypertension EXAM: CHEST - 2 VIEW COMPARISON:  Chest x-ray of Mar 13, 2011 FINDINGS: The lungs are adequately inflated and clear. The cardiac silhouette is top-normal in size. The pulmonary vascularity is normal. The mediastinum is normal in width. There is faint calcification in the wall of the aortic arch. There is mild multilevel degenerative disc disease of the thoracic spine. IMPRESSION: Multilevel degenerative disc disease. There is no acute cardiopulmonary abnormality. Thoracic aortic atherosclerosis. Electronically Signed   By: David  Martinique M.D.   On: 01/19/2018 14:23   US Abdomen Limited Ruq  Result Date: 01/19/2018 CLINICAL DATA:  Elevated LFTs EXAM: ULTRASOUND ABDOMEN LIMITED RIGHT UPPER QUADRANT COMPARISON:  None. FINDINGS: Gallbladder: Gallbladder is well distended with evidence of cholelithiasis. No gallbladder wall thickening or pericholecystic fluid is noted. Common bile duct: Diameter: 3.4 mm. Liver: No focal lesion identified. Within normal limits in parenchymal echogenicity. Portal vein is patent on color Doppler imaging with normal direction of blood flow towards the liver. IMPRESSION: Cholelithiasis without complicating factors. Electronically Signed   By: Inez Catalina M.D.   On: 01/19/2018 20:31    Procedures Procedures (including critical care time)  Medications Ordered in ED Medications - No data to display   Initial Impression / Assessment and Plan / ED Course  I have reviewed the triage vital signs and the nursing notes.  Pertinent labs & imaging results that were available during my care of the patient were reviewed by me and considered in my medical decision making (see chart for details).    Patient is a 57 year old male with past medical history significant for hypertension diabetes and hyperlipidemia presenting with right-sided chest burning.  Patient reports at 54 AM he developed some burning  to his right shoulder.  Then migrated to his right upper quadrant.  Not associated with diaphoresis, did not migrate, no nausea.  Patient reports that resolved about 3 hours later.  Patient has no risk factors for PE including no prolonged immobility, swelling.  5:55 PM Very well-appearing 57 year old male heart score of 3.  Will do delta troponin.  No risk factors for PE.  Patient is completely is asymptomatically this time.  EKG is pending.  9:01 PM EKG is nonischemic.  Patient's liver enzymes are elevated.  T bili is normal.  Patient has no pain currently.  Right upper quadrant shows cholelithiasis without evidence of infection.  No white count.  No fever.  Patient will be given outpatient resources for gallbladder removal as an outpatient.  Patient taking p.o., afebrile, without pain ambulatory and feels in his normal state of health.  Final Clinical Impressions(s) / ED Diagnoses   Final diagnoses:  Elevated liver enzymes    ED Discharge Orders    None       Macarthur Critchley, MD 01/19/18 2103

## 2018-01-20 ENCOUNTER — Ambulatory Visit: Payer: Self-pay

## 2018-01-20 ENCOUNTER — Ambulatory Visit (INDEPENDENT_AMBULATORY_CARE_PROVIDER_SITE_OTHER)
Admission: RE | Admit: 2018-01-20 | Discharge: 2018-01-20 | Disposition: A | Discharge status: Home / Self Care | Payer: 59 | Source: Ambulatory Visit | Attending: Family Medicine | Admitting: Family Medicine

## 2018-01-20 ENCOUNTER — Other Ambulatory Visit: Payer: Self-pay

## 2018-01-20 ENCOUNTER — Encounter: Payer: Self-pay | Admitting: Family Medicine

## 2018-01-20 ENCOUNTER — Ambulatory Visit (INDEPENDENT_AMBULATORY_CARE_PROVIDER_SITE_OTHER): Payer: 59 | Admitting: Family Medicine

## 2018-01-20 ENCOUNTER — Encounter: Payer: Self-pay | Admitting: Internal Medicine

## 2018-01-20 ENCOUNTER — Ambulatory Visit: Payer: 59 | Admitting: Internal Medicine

## 2018-01-20 VITALS — BP 122/88 | Ht 74.0 in | Wt 244.0 lb

## 2018-01-20 VITALS — BP 136/88 | HR 81 | Temp 98.4°F | Ht 74.0 in | Wt 244.0 lb

## 2018-01-20 DIAGNOSIS — K8020 Calculus of gallbladder without cholecystitis without obstruction: Secondary | ICD-10-CM | POA: Diagnosis not present

## 2018-01-20 DIAGNOSIS — R972 Elevated prostate specific antigen [PSA]: Secondary | ICD-10-CM

## 2018-01-20 DIAGNOSIS — G8929 Other chronic pain: Secondary | ICD-10-CM

## 2018-01-20 DIAGNOSIS — I10 Essential (primary) hypertension: Secondary | ICD-10-CM | POA: Diagnosis not present

## 2018-01-20 DIAGNOSIS — R7989 Other specified abnormal findings of blood chemistry: Secondary | ICD-10-CM | POA: Insufficient documentation

## 2018-01-20 DIAGNOSIS — M25562 Pain in left knee: Secondary | ICD-10-CM

## 2018-01-20 DIAGNOSIS — Z0001 Encounter for general adult medical examination with abnormal findings: Secondary | ICD-10-CM | POA: Diagnosis not present

## 2018-01-20 DIAGNOSIS — E785 Hyperlipidemia, unspecified: Secondary | ICD-10-CM

## 2018-01-20 DIAGNOSIS — M23204 Derangement of unspecified medial meniscus due to old tear or injury, left knee: Secondary | ICD-10-CM

## 2018-01-20 DIAGNOSIS — E119 Type 2 diabetes mellitus without complications: Secondary | ICD-10-CM | POA: Diagnosis not present

## 2018-01-20 DIAGNOSIS — R945 Abnormal results of liver function studies: Secondary | ICD-10-CM

## 2018-01-20 HISTORY — DX: Calculus of gallbladder without cholecystitis without obstruction: K80.20

## 2018-01-20 LAB — HIV ANTIBODY (ROUTINE TESTING W REFLEX): HIV 1&2 Ab, 4th Generation: NONREACTIVE

## 2018-01-20 MED ORDER — VITAMIN D (ERGOCALCIFEROL) 1.25 MG (50000 UNIT) PO CAPS: 50000.0000 [IU] | ORAL_CAPSULE | ORAL | 0 refills | Status: DC

## 2018-01-20 MED ORDER — DICLOFENAC SODIUM 2 % TD SOLN: 2.0000 g | Freq: Two times a day (BID) | TRANSDERMAL | 3 refills | Status: DC

## 2018-01-20 MED ORDER — GLUCOSE BLOOD VI STRP: ORAL_STRIP | 12 refills | Status: DC

## 2018-01-20 NOTE — Assessment & Plan Note (Signed)
Medial meniscal tear noted.  Moderate osteoarthritic changes and effusion noted.  Patient declined injection, topical anti-inflammatories given, once weekly vitamin D for the calcific deposits.  Home exercises given.  Discussed compression.  No significant instability of the knee noted today.  Worsening symptoms may need advanced imaging with x-rays pending now.  Follow-up again in 4 weeks

## 2018-01-20 NOTE — Patient Instructions (Signed)
Good to see yo Meniscal tear and some arthritis   Xray downstairs Ice 20 minutes 2 times daily. Usually after activity and before bed. Exercises 3 times a week.  Compression sleeve with a lot of activity  Try to avoid twisting motions.  pennsaid pinkie amount topically 2 times daily as needed.   Once weekly vitamin D for 12 weeks Tart cherry extract 1200mg  at night See me again I n3 weeks

## 2018-01-20 NOTE — Progress Notes (Signed)
[facility] [address], [place] [address] Phone: [phone] Subjective:     I'm seeing this patient by the request  of:  Biagio Borg, MD   CC: Left knee  KDX:[id number]  EDDISON [name] is a 57 y.o. male coming in with complaint of left knee pain. States he feels like his knee is cramping. Keeps him up at night. Knee gets stiff. Swelling.  Does not remember any injury.  It is hurting more on a daily basis now.  Onset- 2 weeks  Location- medial, knee cap Duration- During the day Character- Throbbing Aggravating factors- Stairs, flexion Reliving factors-  Therapies tried- Pennsaid, Ibuprofen  Severity-8 out of 10     Past Medical History:  Diagnosis Date  . Allergic rhinitis, cause unspecified 01/15/2012  . Allergy   . Cholelithiasis 01/20/2018  . Diabetes mellitus   . DIABETES MELLITUS, TYPE II 05/24/2007   Qualifier: Diagnosis of  By: Dance CMA ([place]), Kim    . HYPERLIPIDEMIA 10/06/2007   Qualifier: Diagnosis of  By: Alain Marion MD, Evie Lacks   . HYPERTENSION 02/06/2008   Qualifier: Diagnosis of  By: Alain Marion MD, Evie Lacks   . Lumbar disc disease    s/p surgury approx 1992  . PSA, INCREASED 05/11/2009   Qualifier: Diagnosis of  By: Ronnald Ramp MD, Arvid Right.    Past Surgical History:  Procedure Laterality Date  . BACK SURGERY  1989   Lumbar/Ruptured Disk  . HERNIA REPAIR  may 2012   left inguinal  . KNEE ARTHROSCOPY Right 2014  . NASAL SEPTUM SURGERY     Social History   Socioeconomic History  . Marital status: Married    Spouse name: Not on file  . Number of children: Not on file  . Years of education: Not on file  . Highest education level: Not on file  Occupational History  . Not on file  Social Needs  . Financial resource strain: Not on file  . Food insecurity:    Worry: Not on file    Inability: Not on file  . Transportation needs:    Medical: Not on file    Non-medical: Not on file  Tobacco Use  . Smoking status:  Never Smoker  . Smokeless tobacco: Never Used  Substance and Sexual Activity  . Alcohol use: Yes    Comment: very rare   . Drug use: No  . Sexual activity: Not on file  Lifestyle  . Physical activity:    Days per week: Not on file    Minutes per session: Not on file  . Stress: Not on file  Relationships  . Social connections:    Talks on phone: Not on file    Gets together: Not on file    Attends religious service: Not on file    Active member of club or organization: Not on file    Attends meetings of clubs or organizations: Not on file    Relationship status: Not on file  Other Topics Concern  . Not on file  Social History Narrative  . Not on file   No Known Allergies Family History  Problem Relation Age of Onset  . Hypertension Mother   . Diabetes Mother   . Cancer Mother   . Hypertension Father   . Diabetes Father   . Colon cancer Neg Hx      Past medical history, social, surgical and family history all reviewed in electronic medical record.  No pertanent  information unless stated regarding to the chief complaint.   Review of Systems:Review of systems updated and as accurate as of 01/20/18  No headache, visual changes, nausea, vomiting, diarrhea, constipation, dizziness, abdominal pain, skin rash, fevers, chills, night sweats, weight loss, swollen lymph nodes,s, chest pain, shortness of breath, mood changes.  Positive joint swelling and muscle aches  Objective  There were no vitals taken for this visit. Systems examined below as of 01/20/18   General: No apparent distress alert and oriented x3 mood and affect normal, dressed appropriately.  HEENT: Pupils equal, extraocular movements intact  Respiratory: Patient's speak in full sentences and does not appear short of breath  Cardiovascular: No lower extremity edema, non tender, no erythema  Skin: Warm dry intact with no signs of infection or rash on extremities or on axial skeleton.  Abdomen: Soft nontender    Neuro: Cranial nerves II through XII are intact, neurovascularly intact in all extremities with 2+ DTRs and 2+ pulses.  Lymph: No lymphadenopathy of posterior or anterior cervical chain or axillae bilaterally.  Gait normal with good balance and coordination.  MSK:  Non tender with full range of motion and good stability and symmetric strength and tone of shoulders, elbows, wrist, hip, and ankles bilaterally.  Left knee exam does show the patient does have an effusion.  Lacks last 5 degrees of flexion.  Patient is severely tender over the medial joint line with a positive Thessaly's.  Mild crepitus with range of motion.  Negative patellar grind test.  Mild varus deformity of the knee.  MSK US performed of: Left knee  this study was ordered, performed, and interpreted by Charlann Boxer D.O.  Knee: Patient was found to have a large displaced medial meniscal tear noted.  Hypoechoic changes with calcific changes versus possible uric acid deposits noted.  Effusion noted.  Moderate narrowing of the patellofemoral and the medial joint space  IMPRESSION: Moderate osteoarthritic changes, medial meniscal tear and effusion.    Impression and Recommendations:     This case required medical decision making of moderate complexity.      Note: This dictation was prepared with Dragon dictation along with smaller phrase technology. Any transcriptional errors that result from this process are unintentional.

## 2018-01-20 NOTE — Progress Notes (Signed)
Subjective:    Patient ID: Howard [name], male    DOB: Apr 04, 1961, 57 y.o.   MRN: [id number]  HPI  Here for wellness and f/u;  Overall doing ok;  Pt denies Chest pain, worsening SOB, DOE, wheezing, orthopnea, PND, worsening LE edema, palpitations, dizziness or syncope.  Pt denies neurological change such as new headache, facial or extremity weakness.  Pt denies polydipsia, polyuria, or low sugar symptoms. Pt states overall good compliance with treatment and medications, good tolerability, and has been trying to follow appropriate diet.  Pt denies worsening depressive symptoms, suicidal ideation or panic. No fever, night sweats, wt loss, loss of appetite, or other constitutional symptoms.  Pt states good ability with ADL's, has low fall risk, home safety reviewed and adequate, no other significant changes in hearing or vision, and only occasionally active with exercise.  Declines prevnar. Note wt recorded at ED yesterday was per pt report only (not actually weighed).  Has been eating less due to RUQ pain.  Wt Readings from Last 3 Encounters:  01/20/18 244 lb (110.7 kg)  01/19/18 258 lb (117 kg)  11/20/17 252 lb (114.3 kg)   BP Readings from Last 3 Encounters:  01/20/18 136/88  01/19/18 (!) 139/96  11/20/17 138/84  Seen recently at ED with right CP and RUQ pain, cxr neg but [place] with gallstones, and due to intermittent pain and mild elevated liver tests, was given info on how to get CCX but he asks for referral.  Also has ongoing left knee pain with swelling, but no giveaways or falls Past Medical History:  Diagnosis Date  . Allergic rhinitis, cause unspecified 01/15/2012  . Allergy   . Cholelithiasis 01/20/2018  . Diabetes mellitus   . DIABETES MELLITUS, TYPE II 05/24/2007   Qualifier: Diagnosis of  By: Dance CMA ([place]), Kim    . HYPERLIPIDEMIA 10/06/2007   Qualifier: Diagnosis of  By: Alain Marion MD, Evie Lacks   . HYPERTENSION 02/06/2008   Qualifier: Diagnosis of  By: Alain Marion MD, Evie Lacks   .  Lumbar disc disease    s/p surgury approx 1992  . PSA, INCREASED 05/11/2009   Qualifier: Diagnosis of  By: Ronnald Ramp MD, Arvid Right.    Past Surgical History:  Procedure Laterality Date  . BACK SURGERY  1989   Lumbar/Ruptured Disk  . HERNIA REPAIR  may 2012   left inguinal  . KNEE ARTHROSCOPY Right 2014  . NASAL SEPTUM SURGERY      reports that he has never smoked. He has never used smokeless tobacco. He reports that he drinks alcohol. He reports that he does not use drugs. family history includes Cancer in his mother; Diabetes in his father and mother; Hypertension in his father and mother. No Known Allergies Current Outpatient Medications on File Prior to Visit  Medication Sig Dispense Refill  . aspirin 81 MG EC tablet Take 1 tablet (81 mg total) by mouth daily. Swallow whole. 30 tablet 12  . Blood Glucose Monitoring Suppl (ONE TOUCH ULTRA 2) W/DEVICE KIT Use as directed daily 1 each 0  . Lancets MISC 1 application by Does not apply route daily. 100 each 12  . linagliptin (TRADJENTA) 5 MG TABS tablet Take 1 tablet (5 mg total) by mouth daily. 30 tablet 11  . lisinopril (PRINIVIL,ZESTRIL) 20 MG tablet Take 1 tablet (20 mg total) by mouth daily. 90 tablet 3  . metFORMIN (GLUCOPHAGE-XR) 500 MG 24 hr tablet TAKE FOUR TABLETS BY MOUTH ONCE DAILY 360 tablet 3  . rosuvastatin (  CRESTOR) 40 MG tablet Take 1 tablet (40 mg total) by mouth daily. 90 tablet 3   No current facility-administered medications on file prior to visit.   ROS: Constitutional: Negative for other unusual diaphoresis, sweats, appetite or weight changes HENT: Negative for other worsening hearing loss, ear pain, facial swelling, mouth sores or neck stiffness.   Eyes: Negative for other worsening pain, redness or other visual disturbance.  Respiratory: Negative for other stridor or swelling Cardiovascular: Negative for other palpitations or other chest pain  Gastrointestinal: Negative for worsening diarrhea or loose stools, blood  in stool, distention or other pain Genitourinary: Negative for hematuria, flank pain or other change in urine volume.  Musculoskeletal: Negative for myalgias or other joint swelling.  Skin: Negative for other color change, or other wound or worsening drainage.  Neurological: Negative for other syncope or numbness. Hematological: Negative for other adenopathy or swelling Psychiatric/Behavioral: Negative for hallucinations, other worsening agitation, SI, self-injury, or new decreased concentration All other system neg per pt    Objective:   Physical Exam BP 136/88   Pulse 81   Temp 98.4 F (36.9 C) (Oral)   Ht _0  (1.88 m)   Wt 244 lb (110.7 kg)   SpO2 96%   BMI 31.33 kg/m  VS noted,  Constitutional: Pt is oriented to person, place, and time. Appears well-developed and well-nourished, in no significant distress and comfortable Head: Normocephalic and atraumatic  Eyes: Conjunctivae and EOM are normal. Pupils are equal, round, and reactive to light Right Ear: External ear normal without discharge Left Ear: External ear normal without discharge Nose: Nose without discharge or deformity Mouth/Throat: Oropharynx is without other ulcerations and moist  Neck: Normal range of motion. Neck supple. No JVD present. No tracheal deviation present or significant neck LA or mass Cardiovascular: Normal rate, regular rhythm, normal heart sounds and intact distal pulses.   Pulmonary/Chest: WOB normal and breath sounds without rales or wheezing  Abdominal: Soft. Bowel sounds are normal. NT. No HSM  Musculoskeletal: Normal range of motion. Exhibits no edema, left knee with crepitus, trace effusion, reduced ROM, medial joint line tenderness Lymphadenopathy: Has no other cervical adenopathy.  Neurological: Pt is alert and oriented to person, place, and time. Pt has normal reflexes. No cranial nerve deficit. Motor grossly intact, Gait intact Skin: Skin is warm and dry. No rash noted or new  ulcerations Psychiatric:  Has normal mood and affect. Behavior is normal without agitation No other exam findings  Lab Results  Component Value Date   WBC 4.2 01/19/2018   HGB 13.0 01/19/2018   HCT 40.6 01/19/2018   PLT 206 01/19/2018   GLUCOSE 89 01/19/2018   CHOL 103 01/19/2018   TRIG 37.0 01/19/2018   HDL 46.50 01/19/2018   LDLDIRECT 127.5 04/02/2007   LDLCALC 50 01/19/2018   ALT 220 (H) 01/19/2018   AST 392 (H) 01/19/2018   NA 140 01/19/2018   K 4.1 01/19/2018   CL 104 01/19/2018   CREATININE 0.72 01/19/2018   BUN 11 01/19/2018   CO2 27 01/19/2018   TSH 1.72 01/19/2018   PSA 1.94 01/19/2018   HGBA1C 7.2 (H) 01/19/2018   MICROALBUR 3.6 (H) 01/19/2018   CLINICAL DATA:  Elevated LFTs ULTRASOUND ABDOMEN LIMITED RIGHT UPPER QUADRANT - summary only FINDINGS: Gallbladder:  Gallbladder is well distended with evidence of cholelithiasis. No gallbladder wall thickening or pericholecystic fluid is noted.  Common bile duct:  Diameter: 3.4 mm.  Liver:  No focal lesion identified. Within normal limits in parenchymal  echogenicity. Portal vein is patent on color Doppler imaging with normal direction of blood flow towards the liver.  IMPRESSION: Cholelithiasis without complicating factors.   01/19/2018 2:07 PM 01/19/2018 2:19 PM  PACS Images   Show images for DG Chest 2 View  Study Result   CLINICAL DATA:  Onset of right greater than left chest pain earlier today. Never smoked. History of diabetes and hypertension  CHEST - 2 VIEW - summary only  FINDINGS: The lungs are adequately inflated and clear. The cardiac silhouette is top-normal in size. The pulmonary vascularity is normal. The mediastinum is normal in width. There is faint calcification in the wall of the aortic arch. There is mild multilevel degenerative disc disease of the thoracic spine.  IMPRESSION: Multilevel degenerative disc disease. There is no acute cardiopulmonary  abnormality.  Thoracic aortic atherosclerosis.       Assessment & Plan:

## 2018-01-20 NOTE — Patient Instructions (Signed)
Please continue all other medications as before, and refills have been done if requested.  Please have the pharmacy call with any other refills you may need.  Please continue your efforts at being more active, low cholesterol diet, and weight control.  You are otherwise up to date with prevention measures today.  Please keep your appointments with your specialists as you may have planned  Please see Dr Tamala Julian after this appt today  Please return in 6 months, or sooner if needed, with Lab testing done 3-5 days before

## 2018-01-21 LAB — HEPATITIS PANEL, ACUTE
HCV Ab: <0.1 s/co ratio (ref 0.0–0.9)
HEP A IGM: NEGATIVE
HEP B C IGM: NEGATIVE
Hepatitis B Surface Ag: NEGATIVE

## 2018-01-23 NOTE — Assessment & Plan Note (Signed)

## 2018-01-23 NOTE — Assessment & Plan Note (Signed)
stable overall by history and exam, recent data reviewed with pt, and pt to continue medical treatment as before,  to f/u any worsening symptoms or concerns BP Readings from Last 3 Encounters:  01/20/18 122/88  01/20/18 136/88  01/19/18 (!) 139/96

## 2018-01-23 NOTE — Assessment & Plan Note (Signed)
Likely GB related, for tx as above,  to f/u any worsening symptoms or concerns

## 2018-01-23 NOTE — Assessment & Plan Note (Signed)
I have asked sport med to see him today

## 2018-01-23 NOTE — Assessment & Plan Note (Signed)
stable overall by history and exam, recent data reviewed with pt, and pt to continue medical treatment as before,  to f/u any worsening symptoms or concerns Lab Results  Component Value Date   LDLCALC 50 01/19/2018

## 2018-01-23 NOTE — Assessment & Plan Note (Signed)
Lab Results  Component Value Date   HGBA1C 7.2 (H) 01/19/2018  stable overall by history and exam, recent data reviewed with pt, and pt to continue medical treatment as before,  to f/u any worsening symptoms or concerns

## 2018-01-23 NOTE — Assessment & Plan Note (Addendum)
For general surgury referral, cleared for surgury  In addition to the time spent performing CPE, I spent an additional 25 minutes face to face,in which greater than 50% of this time was spent in counseling and coordination of care for patient's acute illness as documented, including the differential dx, treatment, further evaluation and other management of symptomatic gallstones, elevated LFTs', left knee pain,  DM, HTN, HLD

## 2018-01-24 ENCOUNTER — Other Ambulatory Visit: Payer: Self-pay | Admitting: Internal Medicine

## 2018-01-26 ENCOUNTER — Other Ambulatory Visit: Payer: Self-pay | Admitting: Internal Medicine

## 2018-02-09 NOTE — Progress Notes (Signed)
[facility] [address], [place] [address] Phone: [phone] Subjective:      CC: Knee pain follow-up  [id number]  Howard [name] is a 57 y.o. male coming in with complaint of knee pain.  Was seen 3 weeks ago.  Did have moderate osteoarthritic changes with medial meniscal tear and a joint effusion.  Patient elected to try conservative therapy including once weekly vitamin D, topical anti-inflammatories and icing protocol.  Patient states doing well.  States about 85% better.  Not as much pain.  Has noticed not as much swelling.      Past Medical History:  Diagnosis Date  . Allergic rhinitis, cause unspecified 01/15/2012  . Allergy   . Cholelithiasis 01/20/2018  . Diabetes mellitus   . DIABETES MELLITUS, TYPE II 05/24/2007   Qualifier: Diagnosis of  By: Dance CMA ([place]), Kim    . HYPERLIPIDEMIA 10/06/2007   Qualifier: Diagnosis of  By: Alain Marion MD, Evie Lacks   . HYPERTENSION 02/06/2008   Qualifier: Diagnosis of  By: Alain Marion MD, Evie Lacks   . Lumbar disc disease    s/p surgury approx 1992  . PSA, INCREASED 05/11/2009   Qualifier: Diagnosis of  By: Ronnald Ramp MD, Arvid Right.    Past Surgical History:  Procedure Laterality Date  . BACK SURGERY  1989   Lumbar/Ruptured Disk  . HERNIA REPAIR  may 2012   left inguinal  . KNEE ARTHROSCOPY Right 2014  . NASAL SEPTUM SURGERY     Social History   Socioeconomic History  . Marital status: Married    Spouse name: Not on file  . Number of children: Not on file  . Years of education: Not on file  . Highest education level: Not on file  Occupational History  . Not on file  Social Needs  . Financial resource strain: Not on file  . Food insecurity:    Worry: Not on file    Inability: Not on file  . Transportation needs:    Medical: Not on file    Non-medical: Not on file  Tobacco Use  . Smoking status: Never Smoker  . Smokeless tobacco: Never Used  Substance and Sexual Activity  . Alcohol  use: Yes    Comment: very rare   . Drug use: No  . Sexual activity: Not on file  Lifestyle  . Physical activity:    Days per week: Not on file    Minutes per session: Not on file  . Stress: Not on file  Relationships  . Social connections:    Talks on phone: Not on file    Gets together: Not on file    Attends religious service: Not on file    Active member of club or organization: Not on file    Attends meetings of clubs or organizations: Not on file    Relationship status: Not on file  Other Topics Concern  . Not on file  Social History Narrative  . Not on file   No Known Allergies Family History  Problem Relation Age of Onset  . Hypertension Mother   . Diabetes Mother   . Cancer Mother   . Hypertension Father   . Diabetes Father   . Colon cancer Neg Hx      Past medical history, social, surgical and family history all reviewed in electronic medical record.  No pertanent information unless stated regarding to the chief complaint.   Review of Systems:Review of systems updated and as accurate  as of 02/09/18  No headache, visual changes, nausea, vomiting, diarrhea, constipation, dizziness, abdominal pain, skin rash, fevers, chills, night sweats, weight loss, swollen lymph nodes, body aches, joint swelling, , chest pain, shortness of breath, mood changes.  Positive muscle aches  Objective  There were no vitals taken for this visit. Systems examined below as of 02/09/18   General: No apparent distress alert and oriented x3 mood and affect normal, dressed appropriately.  HEENT: Pupils equal, extraocular movements intact  Respiratory: Patient's speak in full sentences and does not appear short of breath  Cardiovascular: No lower extremity edema, non tender, no erythema  Skin: Warm dry intact with no signs of infection or rash on extremities or on axial skeleton.  Abdomen: Soft nontender  Neuro: Cranial nerves II through XII are intact, neurovascularly intact in all  extremities with 2+ DTRs and 2+ pulses.  Lymph: No lymphadenopathy of posterior or anterior cervical chain or axillae bilaterally.  Gait normal with good balance and coordination.  MSK:  Non tender with full range of motion and good stability and symmetric strength and tone of shoulders, elbows, wrist, hip, nee and ankles bilaterally.  Knee: Left valgus deformity noted. Large thigh to calf ratio.  Tender to palpation over medial and PF joint line.  ROM full in flexion and extension and lower leg rotation. instability with valgus force.  painful patellar compression. Patellar glide with moderate crepitus. Patellar and quadriceps tendons unremarkable. Hamstring and quadriceps strength is normal. Contralateral knee shows mild arthritic changes as well.     Impression and Recommendations:     This case required medical decision making of moderate complexity.      Note: This dictation was prepared with Dragon dictation along with smaller phrase technology. Any transcriptional errors that result from this process are unintentional.       '

## 2018-02-10 ENCOUNTER — Encounter: Payer: Self-pay | Admitting: Family Medicine

## 2018-02-10 ENCOUNTER — Ambulatory Visit: Payer: 59 | Admitting: Family Medicine

## 2018-02-10 DIAGNOSIS — M13862 Other specified arthritis, left knee: Secondary | ICD-10-CM

## 2018-02-10 NOTE — Patient Instructions (Signed)
Good to see you  Alvera Singh is your friend.  Stay active.  Continue the vitamins and the exercises Worsening pain call me at [phone] We can also do a custom brace or orthotics if needed Different injections as well  See me when you need me

## 2018-02-10 NOTE — Assessment & Plan Note (Signed)
Left knee arthritis.  Mostly medial compartment.  May need any medial unloader brace and went to customize secondary to the abnormal thigh to calf ratio.  Patient was to continue with conservative therapy.  Hold on any type of injection.  Patient will follow-up with me again 2 to 3 months

## 2018-02-18 ENCOUNTER — Ambulatory Visit: Payer: 59 | Admitting: Podiatry

## 2018-02-19 ENCOUNTER — Ambulatory Visit: Payer: 59 | Admitting: Podiatry

## 2018-02-19 ENCOUNTER — Encounter: Payer: Self-pay | Admitting: Podiatry

## 2018-02-19 DIAGNOSIS — E119 Type 2 diabetes mellitus without complications: Secondary | ICD-10-CM

## 2018-02-19 DIAGNOSIS — Q828 Other specified congenital malformations of skin: Secondary | ICD-10-CM

## 2018-02-19 NOTE — Progress Notes (Signed)
This patient  presents to the office for his annual diabetic foot exam.  He says he has continued callus pain on the bottom of his left foot.  He says these are painful but repairable.  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  He presents the office today for an evaluation and treatment of his diabetic feet.   General Appearance  Alert, conversant and in no acute stress.  Vascular  Dorsalis pedis and posterior tibial  pulses are palpable  bilaterally.  Capillary return is within normal limits  bilaterally. Temperature is within normal limits  bilaterally.  Neurologic  Senn-Weinstein monofilament wire test within normal limits  bilaterally. Muscle power within normal limits bilaterally.  Nails Thick disfigured discolored nails with subungual debris  from hallux to fifth toes bilaterally. No evidence of bacterial infection or drainage bilaterally.  Orthopedic  No limitations of motion of motion feet .  No crepitus or effusions noted.  No bony pathology or digital deformities noted. HAV  B/L.  DJD dorsal aspect feet  B/L.  Retrocalcaneal one spur left foot.    Skin  normotropic skin noted bilaterally.  No signs of infections or ulcers noted.  Porokeratosis sub 2,5 left foot.  Diabetes with no complications.  Porokeratosis  Left foot.  ROV.  Debride porokeratosis  Left foot.  RTC 1 year.   Gardiner Barefoot DPM

## 2018-04-08 ENCOUNTER — Ambulatory Visit: Payer: 59 | Admitting: Internal Medicine

## 2018-04-08 ENCOUNTER — Encounter: Payer: Self-pay | Admitting: Internal Medicine

## 2018-04-08 ENCOUNTER — Telehealth: Payer: Self-pay | Admitting: Internal Medicine

## 2018-04-08 VITALS — BP 126/88 | HR 80 | Temp 98.6°F | Ht 74.0 in | Wt 232.0 lb

## 2018-04-08 DIAGNOSIS — E119 Type 2 diabetes mellitus without complications: Secondary | ICD-10-CM

## 2018-04-08 DIAGNOSIS — J069 Acute upper respiratory infection, unspecified: Secondary | ICD-10-CM

## 2018-04-08 DIAGNOSIS — I10 Essential (primary) hypertension: Secondary | ICD-10-CM | POA: Diagnosis not present

## 2018-04-08 MED ORDER — AZITHROMYCIN 250 MG PO TABS: ORAL_TABLET | ORAL | 1 refills | Status: DC

## 2018-04-08 MED ORDER — HYDROCODONE-HOMATROPINE 5-1.5 MG/5ML PO SYRP: 5.0000 mL | ORAL_SOLUTION | Freq: Four times a day (QID) | ORAL | 0 refills | Status: DC | PRN

## 2018-04-08 MED ORDER — GUAIFENESIN-CODEINE 100-10 MG/5ML PO SYRP: 5.0000 mL | ORAL_SOLUTION | Freq: Three times a day (TID) | ORAL | 0 refills | Status: DC | PRN

## 2018-04-08 NOTE — Assessment & Plan Note (Signed)
stable overall by history and exam, recent data reviewed with pt, and pt to continue medical treatment as before,  to f/u any worsening symptoms or concerns Lab Results  Component Value Date   HGBA1C 7.2 (H) 01/19/2018

## 2018-04-08 NOTE — Assessment & Plan Note (Signed)
stable overall by history and exam, recent data reviewed with pt, and pt to continue medical treatment as before,  to f/u any worsening symptoms or concerns BP Readings from Last 3 Encounters:  04/08/18 126/88  02/10/18 (!) 146/92  01/20/18 122/88

## 2018-04-08 NOTE — Patient Instructions (Addendum)
Please take all new medication as prescribed - the antibiotic, and cough medicine  You can also take Mucinex (or it's generic off brand) for congestion, and tylenol as needed for pain.  Please continue all other medications as before, and refills have been done if requested.  Please have the pharmacy call with any other refills you may need.  Please keep your appointments with your specialists as you may have planned

## 2018-04-08 NOTE — Progress Notes (Signed)
Subjective:    Patient ID: Howard [name], male    DOB: April 27, 1961, 57 y.o.   MRN: [id number]  HPI     Here with 2-3 days acute onset fever, facial pain, pressure, headache, general weakness and malaise, and greenish d/c, with mild ST and cough, but pt denies chest pain, wheezing, increased sob or doe, orthopnea, PND, increased LE swelling, palpitations, dizziness or syncope.   Pt denies polydipsia, polyuria, or low sugar symptoms such as weakness or confusion improved with po intake.  Pt states overall good compliance with meds, trying to follow lower cholesterol, diabetic diet, wt overall stable but little exercise however.  Pt denies new neurological symptoms such as new headache, or facial or extremity weakness or numbness Past Medical History:  Diagnosis Date  . Allergic rhinitis, cause unspecified 01/15/2012  . Allergy   . Cholelithiasis 01/20/2018  . Diabetes mellitus   . DIABETES MELLITUS, TYPE II 05/24/2007   Qualifier: Diagnosis of  By: Dance CMA ([place]), Kim    . HYPERLIPIDEMIA 10/06/2007   Qualifier: Diagnosis of  By: Alain Marion MD, Evie Lacks   . HYPERTENSION 02/06/2008   Qualifier: Diagnosis of  By: Alain Marion MD, Evie Lacks   . Lumbar disc disease    s/p surgury approx 1992  . PSA, INCREASED 05/11/2009   Qualifier: Diagnosis of  By: Ronnald Ramp MD, Arvid Right.    Past Surgical History:  Procedure Laterality Date  . BACK SURGERY  1989   Lumbar/Ruptured Disk  . HERNIA REPAIR  may 2012   left inguinal  . KNEE ARTHROSCOPY Right 2014  . NASAL SEPTUM SURGERY      reports that he has never smoked. He has never used smokeless tobacco. He reports that he drinks alcohol. He reports that he does not use drugs. family history includes Cancer in his mother; Diabetes in his father and mother; Hypertension in his father and mother. No Known Allergies Current Outpatient Medications on File Prior to Visit  Medication Sig Dispense Refill  . aspirin 81 MG EC tablet Take 1 tablet (81 mg total) by mouth  daily. Swallow whole. 30 tablet 12  . Blood Glucose Monitoring Suppl (ONE TOUCH ULTRA 2) W/DEVICE KIT Use as directed daily 1 each 0  . Diclofenac Sodium (PENNSAID) 2 % SOLN Place 2 g onto the skin 2 (two) times daily. 112 g 3  . glucose blood (ONE TOUCH ULTRA TEST) test strip Use as instructed once daily. Diagnosis 250.0 100 each 12  . Lancets MISC 1 application by Does not apply route daily. 100 each 12  . linagliptin (TRADJENTA) 5 MG TABS tablet Take 1 tablet (5 mg total) by mouth daily. 30 tablet 11  . lisinopril (PRINIVIL,ZESTRIL) 20 MG tablet TAKE 1 TABLET BY MOUTH ONCE DAILY 30 tablet 11  . metFORMIN (GLUCOPHAGE-XR) 500 MG 24 hr tablet TAKE 4 TABLETS BY MOUTH ONCE DAILY 120 tablet 11  . rosuvastatin (CRESTOR) 40 MG tablet Take 1 tablet (40 mg total) by mouth daily. 90 tablet 3  . Vitamin D, Ergocalciferol, (DRISDOL) 50000 units CAPS capsule Take 1 capsule (50,000 Units total) by mouth every 7 (seven) days. 12 capsule 0   No current facility-administered medications on file prior to visit.    Review of Systems  Constitutional: Negative for other unusual diaphoresis or sweats HENT: Negative for ear discharge or swelling Eyes: Negative for other worsening visual disturbances Respiratory: Negative for stridor or other swelling  Gastrointestinal: Negative for worsening distension or other blood Genitourinary: Negative for retention or  other urinary change Musculoskeletal: Negative for other MSK pain or swelling Skin: Negative for color change or other new lesions Neurological: Negative for worsening tremors and other numbness  Psychiatric/Behavioral: Negative for worsening agitation or other fatigue All other system neg per pt    Objective:   Physical Exam BP 126/88   Pulse 80   Temp 98.6 F (37 C) (Oral)   Ht _0  (1.88 m)   Wt 232 lb (105.2 kg)   SpO2 96%   BMI 29.79 kg/m  VS noted, mild ill Constitutional: Pt appears in NAD HENT: Head: NCAT.  Right Ear: External ear  normal.  Left Ear: External ear normal.  Bilat tm's with mild erythema.  Max sinus areas non tender.  Pharynx with mild erythema, no exudate  Eyes: . Pupils are equal, round, and reactive to light. Conjunctivae and EOM are normal Nose: without d/c or deformity Neck: Neck supple. Gross normal ROM Cardiovascular: Normal rate and regular rhythm.   Pulmonary/Chest: Effort normal and breath sounds without rales or wheezing.  Neurological: Pt is alert. At baseline orientation, motor grossly intact Skin: Skin is warm. No rashes, other new lesions, no LE edema Psychiatric: Pt behavior is normal without agitation  No other exam findings    Assessment & Plan:

## 2018-04-08 NOTE — Assessment & Plan Note (Signed)
Mild to mod, for antibx course,  to f/u any worsening symptoms or concerns 

## 2018-04-08 NOTE — Addendum Note (Signed)
Addended by: Juliet Rude on: 04/08/2018 10:24 AM   Modules accepted: Orders

## 2018-04-08 NOTE — Telephone Encounter (Signed)
Hycodan changed to cheratuss AC due to back order

## 2018-07-18 ENCOUNTER — Encounter: Payer: Self-pay | Admitting: Internal Medicine

## 2018-07-22 ENCOUNTER — Ambulatory Visit: Payer: 59 | Admitting: Internal Medicine

## 2018-07-26 ENCOUNTER — Other Ambulatory Visit: Payer: Self-pay | Admitting: Family Medicine

## 2018-07-26 ENCOUNTER — Other Ambulatory Visit (INDEPENDENT_AMBULATORY_CARE_PROVIDER_SITE_OTHER): Payer: 59

## 2018-07-26 DIAGNOSIS — R972 Elevated prostate specific antigen [PSA]: Secondary | ICD-10-CM

## 2018-07-26 DIAGNOSIS — E119 Type 2 diabetes mellitus without complications: Secondary | ICD-10-CM

## 2018-07-26 LAB — BASIC METABOLIC PANEL
BUN: 11 mg/dL (ref 6–23)
CALCIUM: 9 mg/dL (ref 8.4–10.5)
CO2: 29 mEq/L (ref 19–32)
Chloride: 104 mEq/L (ref 96–112)
Creatinine, Ser: 0.85 mg/dL (ref 0.40–1.50)
GFR: 119.76 mL/min (ref >60.00)
GLUCOSE: 136 mg/dL — AB (ref 70–99)
Potassium: 4.4 mEq/L (ref 3.5–5.1)
SODIUM: 138 meq/L (ref 135–145)

## 2018-07-26 LAB — PSA: PSA: 1.74 ng/mL (ref 0.10–4.00)

## 2018-07-26 LAB — HEPATIC FUNCTION PANEL
ALT: 16 U/L (ref 0–53)
AST: 16 U/L (ref 0–37)
Albumin: 3.8 g/dL (ref 3.5–5.2)
Alkaline Phosphatase: 67 U/L (ref 39–117)
BILIRUBIN TOTAL: 0.4 mg/dL (ref 0.2–1.2)
Bilirubin, Direct: 0.1 mg/dL (ref 0.0–0.3)
Total Protein: 6.2 g/dL (ref 6.0–8.3)

## 2018-07-26 LAB — LIPID PANEL
Cholesterol: 102 mg/dL (ref 0–200)
HDL: 43.6 mg/dL (ref >39.00)
LDL Cholesterol: 48 mg/dL (ref 0–99)
NONHDL: 58.36
Total CHOL/HDL Ratio: 2
Triglycerides: 51 mg/dL (ref 0.0–149.0)
VLDL: 10.2 mg/dL (ref 0.0–40.0)

## 2018-07-26 LAB — HEMOGLOBIN A1C: Hgb A1c MFr Bld: 6.8 % — ABNORMAL HIGH (ref 4.6–6.5)

## 2018-07-28 ENCOUNTER — Encounter: Payer: Self-pay | Admitting: Internal Medicine

## 2018-07-28 ENCOUNTER — Ambulatory Visit: Payer: 59 | Admitting: Internal Medicine

## 2018-07-28 VITALS — BP 134/88 | HR 63 | Temp 98.0°F | Ht 74.0 in | Wt 235.0 lb

## 2018-07-28 DIAGNOSIS — E785 Hyperlipidemia, unspecified: Secondary | ICD-10-CM | POA: Diagnosis not present

## 2018-07-28 DIAGNOSIS — Z0000 Encounter for general adult medical examination without abnormal findings: Secondary | ICD-10-CM | POA: Diagnosis not present

## 2018-07-28 DIAGNOSIS — I10 Essential (primary) hypertension: Secondary | ICD-10-CM

## 2018-07-28 DIAGNOSIS — E119 Type 2 diabetes mellitus without complications: Secondary | ICD-10-CM | POA: Diagnosis not present

## 2018-07-28 NOTE — Progress Notes (Signed)
Subjective:    Patient ID: Howard [name], male    DOB: February 13, 1961, 57 y.o.   MRN: [id number]  HPI  Here to f/u; overall doing ok,  Pt denies chest pain, increasing sob or doe, wheezing, orthopnea, PND, increased LE swelling, palpitations, dizziness or syncope.  Pt denies new neurological symptoms such as new headache, or facial or extremity weakness or numbness.  Pt denies polydipsia, polyuria, or low sugar episode.  Pt states overall good compliance with meds, mostly trying to follow appropriate diet, with wt overall stable,  but little exercise however. No new complaints Wt Readings from Last 3 Encounters:  07/28/18 235 lb (106.6 kg)  04/08/18 232 lb (105.2 kg)  02/10/18 237 lb (107.5 kg)   Past Medical History:  Diagnosis Date  . Allergic rhinitis, cause unspecified 01/15/2012  . Allergy   . Cholelithiasis 01/20/2018  . Diabetes mellitus   . DIABETES MELLITUS, TYPE II 05/24/2007   Qualifier: Diagnosis of  By: Dance CMA ([place]), Kim    . HYPERLIPIDEMIA 10/06/2007   Qualifier: Diagnosis of  By: Alain Marion MD, Evie Lacks   . HYPERTENSION 02/06/2008   Qualifier: Diagnosis of  By: Alain Marion MD, Evie Lacks   . Lumbar disc disease    s/p surgury approx 1992  . PSA, INCREASED 05/11/2009   Qualifier: Diagnosis of  By: Ronnald Ramp MD, Arvid Right.    Past Surgical History:  Procedure Laterality Date  . BACK SURGERY  1989   Lumbar/Ruptured Disk  . HERNIA REPAIR  may 2012   left inguinal  . KNEE ARTHROSCOPY Right 2014  . NASAL SEPTUM SURGERY      reports that he has never smoked. He has never used smokeless tobacco. He reports that he drinks alcohol. He reports that he does not use drugs. family history includes Cancer in his mother; Diabetes in his father and mother; Hypertension in his father and mother. No Known Allergies Current Outpatient Medications on File Prior to Visit  Medication Sig Dispense Refill  . aspirin 81 MG EC tablet Take 1 tablet (81 mg total) by mouth daily. Swallow whole. 30  tablet 12  . azithromycin (ZITHROMAX Z-PAK) 250 MG tablet 2 tab by mouth day 1, then 1 per day 6 tablet 1  . Blood Glucose Monitoring Suppl (ONE TOUCH ULTRA 2) W/DEVICE KIT Use as directed daily 1 each 0  . Diclofenac Sodium (PENNSAID) 2 % SOLN Place 2 g onto the skin 2 (two) times daily. 112 g 3  . glucose blood (ONE TOUCH ULTRA TEST) test strip Use as instructed once daily. Diagnosis 250.0 100 each 12  . guaiFENesin-codeine (CHERATUSSIN AC) 100-10 MG/5ML syrup Take 5 mLs by mouth 3 (three) times daily as needed for cough. 180 mL 0  . Lancets MISC 1 application by Does not apply route daily. 100 each 12  . linagliptin (TRADJENTA) 5 MG TABS tablet Take 1 tablet (5 mg total) by mouth daily. 30 tablet 11  . lisinopril (PRINIVIL,ZESTRIL) 20 MG tablet TAKE 1 TABLET BY MOUTH ONCE DAILY 30 tablet 11  . metFORMIN (GLUCOPHAGE-XR) 500 MG 24 hr tablet TAKE 4 TABLETS BY MOUTH ONCE DAILY 120 tablet 11  . rosuvastatin (CRESTOR) 40 MG tablet Take 1 tablet (40 mg total) by mouth daily. 90 tablet 3  . Vitamin D, Ergocalciferol, (DRISDOL) 50000 units CAPS capsule TAKE 1 CAPSULE BY MOUTH ONCE A WEEK 12 capsule 0   No current facility-administered medications on file prior to visit.    Review of Systems  Constitutional: Negative  for other unusual diaphoresis or sweats HENT: Negative for ear discharge or swelling Eyes: Negative for other worsening visual disturbances Respiratory: Negative for stridor or other swelling  Gastrointestinal: Negative for worsening distension or other blood Genitourinary: Negative for retention or other urinary change Musculoskeletal: Negative for other MSK pain or swelling Skin: Negative for color change or other new lesions Neurological: Negative for worsening tremors and other numbness  Psychiatric/Behavioral: Negative for worsening agitation or other fatigue All other system neg per pt    Objective:   Physical Exam BP 134/88   Pulse 63   Temp 98 F (36.7 C) (Oral)   Ht  6' 2"  (1.88 m)   Wt 235 lb (106.6 kg)   SpO2 97%   BMI 30.17 kg/m  VS noted,  Constitutional: Pt appears in NAD HENT: Head: NCAT.  Right Ear: External ear normal.  Left Ear: External ear normal.  Eyes: . Pupils are equal, round, and reactive to light. Conjunctivae and EOM are normal Nose: without d/c or deformity Neck: Neck supple. Gross normal ROM Cardiovascular: Normal rate and regular rhythm.   Pulmonary/Chest: Effort normal and breath sounds without rales or wheezing.  Abd:  Soft, NT, ND, + BS, no organomegaly Neurological: Pt is alert. At baseline orientation, motor grossly intact Skin: Skin is warm. No rashes, other new lesions, no LE edema Psychiatric: Pt behavior is normal without agitation  No other exam findings Lab Results  Component Value Date   WBC 4.2 01/19/2018   HGB 13.0 01/19/2018   HCT 40.6 01/19/2018   PLT 206 01/19/2018   GLUCOSE 136 (H) 07/26/2018   CHOL 102 07/26/2018   TRIG 51.0 07/26/2018   HDL 43.60 07/26/2018   LDLDIRECT 127.5 04/02/2007   LDLCALC 48 07/26/2018   ALT 16 07/26/2018   AST 16 07/26/2018   NA 138 07/26/2018   K 4.4 07/26/2018   CL 104 07/26/2018   CREATININE 0.85 07/26/2018   BUN 11 07/26/2018   CO2 29 07/26/2018   TSH 1.72 01/19/2018   PSA 1.74 07/26/2018   HGBA1C 6.8 (H) 07/26/2018   MICROALBUR 3.6 (H) 01/19/2018       Assessment & Plan:

## 2018-07-28 NOTE — Assessment & Plan Note (Signed)
stable overall by history and exam, recent data reviewed with pt, and pt to continue medical treatment as before,  to f/u any worsening symptoms or concerns  

## 2018-07-28 NOTE — Patient Instructions (Signed)
Please continue all other medications as before, and refills have been done if requested.  Please have the pharmacy call with any other refills you may need.  Please continue your efforts at being more active, low cholesterol diet, and weight control.  You are otherwise up to date with prevention measures today.  Please keep your appointments with your specialists as you may have planned  Please return in 6 months, or sooner if needed, with Lab testing done 3-5 days before  

## 2018-08-01 ENCOUNTER — Other Ambulatory Visit: Payer: Self-pay | Admitting: Internal Medicine

## 2019-02-01 ENCOUNTER — Encounter: Payer: Self-pay | Admitting: Internal Medicine

## 2019-02-01 ENCOUNTER — Ambulatory Visit (INDEPENDENT_AMBULATORY_CARE_PROVIDER_SITE_OTHER): Payer: 59 | Admitting: Internal Medicine

## 2019-02-01 DIAGNOSIS — N529 Male erectile dysfunction, unspecified: Secondary | ICD-10-CM

## 2019-02-01 DIAGNOSIS — J309 Allergic rhinitis, unspecified: Secondary | ICD-10-CM

## 2019-02-01 DIAGNOSIS — E119 Type 2 diabetes mellitus without complications: Secondary | ICD-10-CM

## 2019-02-01 DIAGNOSIS — E559 Vitamin D deficiency, unspecified: Secondary | ICD-10-CM

## 2019-02-01 DIAGNOSIS — I10 Essential (primary) hypertension: Secondary | ICD-10-CM

## 2019-02-01 DIAGNOSIS — Z0001 Encounter for general adult medical examination with abnormal findings: Secondary | ICD-10-CM

## 2019-02-01 DIAGNOSIS — Z0000 Encounter for general adult medical examination without abnormal findings: Secondary | ICD-10-CM

## 2019-02-01 DIAGNOSIS — E538 Deficiency of other specified B group vitamins: Secondary | ICD-10-CM

## 2019-02-01 DIAGNOSIS — E785 Hyperlipidemia, unspecified: Secondary | ICD-10-CM

## 2019-02-01 MED ORDER — TRIAMCINOLONE ACETONIDE 55 MCG/ACT NA AERO: 2.0000 | INHALATION_SPRAY | Freq: Every day | NASAL | 12 refills | Status: DC

## 2019-02-01 MED ORDER — METFORMIN HCL ER 500 MG PO TB24: ORAL_TABLET | ORAL | 3 refills | Status: DC

## 2019-02-01 MED ORDER — LISINOPRIL 20 MG PO TABS: 20.0000 mg | ORAL_TABLET | Freq: Every day | ORAL | 3 refills | Status: DC

## 2019-02-01 MED ORDER — ROSUVASTATIN CALCIUM 40 MG PO TABS: 40.0000 mg | ORAL_TABLET | Freq: Every day | ORAL | 3 refills | Status: DC

## 2019-02-01 NOTE — Assessment & Plan Note (Signed)
stable overall by history and exam, recent data reviewed with pt, and pt to continue medical treatment as before,  to f/u any worsening symptoms or concerns., for lipids with labs, goal ldl < 70

## 2019-02-01 NOTE — Patient Instructions (Signed)
Please take all new medication as prescribed - the nasacort as directed  Please continue all other medications as before, and refills have been done if requested.  Please have the pharmacy call with any other refills you may need.  Please continue your efforts at being more active, low cholesterol diet, and weight control.  You are otherwise up to date with prevention measures today.  Please keep your appointments with your specialists as you may have planned  Please go to the LAB in the Basement (turn left off the elevator) for the tests to be done at your convenience  You will be contacted by phone if any changes need to be made immediately.  Otherwise, you will receive a letter about your results with an explanation, but please check with MyChart first.  Please remember to sign up for MyChart if you have not done so, as this will be important to you in the future with finding out test results, communicating by private email, and scheduling acute appointments online when needed.  Please return in 6 months, or sooner if needed, with Lab testing done 3-5 days before

## 2019-02-01 NOTE — Assessment & Plan Note (Signed)
Mild, for testosterone level, declines PDE5 for now

## 2019-02-01 NOTE — Assessment & Plan Note (Signed)
stable overall by history and exam, recent data reviewed with pt, and pt to continue medical treatment as before,  to f/u any worsening symptoms or concerns, for a1c with labs 

## 2019-02-01 NOTE — Assessment & Plan Note (Addendum)
BP Readings from Last 3 Encounters:  07/28/18 134/88  04/08/18 126/88  02/10/18 (!) 146/92   Pt encouraged to check BP at home on regular basis with home monitor, goal < 130/80

## 2019-02-01 NOTE — Assessment & Plan Note (Signed)

## 2019-02-01 NOTE — Progress Notes (Signed)
Patient ID: Howard [name], male   DOB: [birth year], 58 y.o.   MRN: [id number]  Virtual Visit via Video Note  I connected with Ross Marcus on 02/01/19 at  8:40 AM EDT by a video enabled telemedicine application and verified that I am speaking with the correct person using two identifiers.  I am at the office pt is at home, and wife present as well   I discussed the limitations of evaluation and management by telemedicine and the availability of in person appointments. The patient expressed understanding and agreed to proceed.  History of Present Illness: Here for wellness and f/u;  Overall doing ok;  Pt denies Chest pain, worsening SOB, DOE, wheezing, orthopnea, PND, worsening LE edema, palpitations, dizziness or syncope.  Pt denies neurological change such as new headache, facial or extremity weakness.  Pt denies polydipsia, polyuria, or low sugar symptoms. Pt states overall good compliance with treatment and medications, good tolerability, and has been trying to follow appropriate diet.  Pt denies worsening depressive symptoms, suicidal ideation or panic. No fever, night sweats, wt loss, loss of appetite, or other constitutional symptoms.  Pt states good ability with ADL's, has low fall risk, home safety reviewed and adequate, no other significant changes in hearing or vision, and only occasionally active with exercise,, stuck at home during the pandemic.  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    Does also have several wks ongoing nasal allergy symptoms with clearish congestion, itch and sneezing, without fever, pain, ST, cough, swelling or wheezing, and zyrtec not always effective.  Needs all med refills.  Also has worsening very mild ED symptoms in the past 6 mo, asks for testosterone check, but does not think he needs the viagra or similar yet. Past Medical History:  Diagnosis Date  . Allergic rhinitis, cause unspecified 01/15/2012  . Allergy   . Cholelithiasis  01/20/2018  . Diabetes mellitus   . DIABETES MELLITUS, TYPE II 05/24/2007   Qualifier: Diagnosis of  By: Dance CMA ([place]), Kim    . HYPERLIPIDEMIA 10/06/2007   Qualifier: Diagnosis of  By: Alain Marion MD, Evie Lacks   . HYPERTENSION 02/06/2008   Qualifier: Diagnosis of  By: Alain Marion MD, Evie Lacks   . Lumbar disc disease    s/p surgury approx 1992  . PSA, INCREASED 05/11/2009   Qualifier: Diagnosis of  By: Ronnald Ramp MD, Arvid Right.    Past Surgical History:  Procedure Laterality Date  . BACK SURGERY  1989   Lumbar/Ruptured Disk  . HERNIA REPAIR  may 2012   left inguinal  . KNEE ARTHROSCOPY Right 2014  . NASAL SEPTUM SURGERY      reports that he has never smoked. He has never used smokeless tobacco. He reports current alcohol use. He reports that he does not use drugs. family history includes Cancer in his mother; Diabetes in his father and mother; Hypertension in his father and mother. No Known Allergies Current Outpatient Medications on File Prior to Visit  Medication Sig Dispense Refill  . aspirin 81 MG EC tablet Take 1 tablet (81 mg total) by mouth daily. Swallow whole. 30 tablet 12  . azithromycin (ZITHROMAX Z-PAK) 250 MG tablet 2 tab by mouth day 1, then 1 per day 6 tablet 1  . Blood Glucose Monitoring Suppl (ONE TOUCH ULTRA 2) W/DEVICE KIT Use as directed daily 1 each 0  . Diclofenac Sodium (PENNSAID) 2 % SOLN Place 2 g onto the skin 2 (two) times daily. 112 g  3  . glucose blood (ONE TOUCH ULTRA TEST) test strip Use as instructed once daily. Diagnosis 250.0 100 each 12  . guaiFENesin-codeine (CHERATUSSIN AC) 100-10 MG/5ML syrup Take 5 mLs by mouth 3 (three) times daily as needed for cough. 180 mL 0  . Lancets MISC 1 application by Does not apply route daily. 100 each 12  . linagliptin (TRADJENTA) 5 MG TABS tablet Take 1 tablet (5 mg total) by mouth daily. 30 tablet 11  . lisinopril (PRINIVIL,ZESTRIL) 20 MG tablet TAKE 1 TABLET BY MOUTH ONCE DAILY 30 tablet 11  . metFORMIN (GLUCOPHAGE-XR)  500 MG 24 hr tablet TAKE 4 TABLETS BY MOUTH ONCE DAILY 120 tablet 11  . rosuvastatin (CRESTOR) 40 MG tablet TAKE 1 TABLET BY MOUTH ONCE DAILY 90 tablet 1  . Vitamin D, Ergocalciferol, (DRISDOL) 50000 units CAPS capsule TAKE 1 CAPSULE BY MOUTH ONCE A WEEK 12 capsule 0   No current facility-administered medications on file prior to visit.     Observations/Objective: Alert, NAD, mentating well and appropriate, resp normal, CN 2-12 intact, moves all 4s, no visible rash or swelling Lab Results  Component Value Date   WBC 4.2 01/19/2018   HGB 13.0 01/19/2018   HCT 40.6 01/19/2018   PLT 206 01/19/2018   GLUCOSE 136 (H) 07/26/2018   CHOL 102 07/26/2018   TRIG 51.0 07/26/2018   HDL 43.60 07/26/2018   LDLDIRECT 127.5 04/02/2007   LDLCALC 48 07/26/2018   ALT 16 07/26/2018   AST 16 07/26/2018   NA 138 07/26/2018   K 4.4 07/26/2018   CL 104 07/26/2018   CREATININE 0.85 07/26/2018   BUN 11 07/26/2018   CO2 29 07/26/2018   TSH 1.72 01/19/2018   PSA 1.74 07/26/2018   HGBA1C 6.8 (H) 07/26/2018   MICROALBUR 3.6 (H) 01/19/2018   Assessment and Plan: See notes  Follow Up Instructions: See notes   I discussed the assessment and treatment plan with the patient. The patient was provided an opportunity to ask questions and all were answered. The patient agreed with the plan and demonstrated an understanding of the instructions.   The patient was advised to call back or seek an in-person evaluation if the symptoms worsen or if the condition fails to improve as anticipated.  Cathlean Cower, MD

## 2019-02-01 NOTE — Assessment & Plan Note (Addendum)
Mild to mod, not controlled with zyrtec, for add nasacort asd, to f/u any worsening symptoms or concerns  In addition to the time spent performing CPE, I spent an additional 15 minutes face to face,in which greater than 50% of this time was spent in counseling and coordination of care for patient's illness as documented, including the differential dx, treatment, further evaluation and other management of allergies, DM, HTN, HLD and ED

## 2019-02-02 ENCOUNTER — Other Ambulatory Visit (INDEPENDENT_AMBULATORY_CARE_PROVIDER_SITE_OTHER): Payer: 59

## 2019-02-02 DIAGNOSIS — E119 Type 2 diabetes mellitus without complications: Secondary | ICD-10-CM | POA: Diagnosis not present

## 2019-02-02 DIAGNOSIS — E538 Deficiency of other specified B group vitamins: Secondary | ICD-10-CM | POA: Diagnosis not present

## 2019-02-02 DIAGNOSIS — Z0000 Encounter for general adult medical examination without abnormal findings: Secondary | ICD-10-CM

## 2019-02-02 DIAGNOSIS — N529 Male erectile dysfunction, unspecified: Secondary | ICD-10-CM

## 2019-02-02 DIAGNOSIS — E559 Vitamin D deficiency, unspecified: Secondary | ICD-10-CM | POA: Diagnosis not present

## 2019-02-02 LAB — URINALYSIS, ROUTINE W REFLEX MICROSCOPIC
Bilirubin Urine: NEGATIVE
Hgb urine dipstick: NEGATIVE
Ketones, ur: NEGATIVE
Leukocytes,Ua: NEGATIVE
Nitrite: NEGATIVE
RBC / HPF: NONE SEEN (ref 0–?)
Specific Gravity, Urine: >=1.03 — AB (ref 1.000–1.030)
Total Protein, Urine: NEGATIVE
Urine Glucose: NEGATIVE
Urobilinogen, UA: 0.2 (ref 0.0–1.0)
pH: 5 (ref 5.0–8.0)

## 2019-02-02 LAB — BASIC METABOLIC PANEL
BUN: 17 mg/dL (ref 6–23)
CO2: 29 mEq/L (ref 19–32)
Calcium: 9.4 mg/dL (ref 8.4–10.5)
Chloride: 104 mEq/L (ref 96–112)
Creatinine, Ser: 0.94 mg/dL (ref 0.40–1.50)
GFR: 100.14 mL/min (ref >60.00)
Glucose, Bld: 115 mg/dL — ABNORMAL HIGH (ref 70–99)
Potassium: 4.3 mEq/L (ref 3.5–5.1)
Sodium: 141 mEq/L (ref 135–145)

## 2019-02-02 LAB — MICROALBUMIN / CREATININE URINE RATIO
Creatinine,U: 333.7 mg/dL
Microalb Creat Ratio: 0.7 mg/g (ref 0.0–30.0)
Microalb, Ur: 2.4 mg/dL — ABNORMAL HIGH (ref 0.0–1.9)

## 2019-02-02 LAB — CBC WITH DIFFERENTIAL/PLATELET
Basophils Absolute: 0.1 10*3/uL (ref 0.0–0.1)
Basophils Relative: 1.7 % (ref 0.0–3.0)
Eosinophils Absolute: 0.1 10*3/uL (ref 0.0–0.7)
Eosinophils Relative: 2.4 % (ref 0.0–5.0)
HCT: 40.3 % (ref 39.0–52.0)
Hemoglobin: 13.5 g/dL (ref 13.0–17.0)
Lymphocytes Relative: 33.5 % (ref 12.0–46.0)
Lymphs Abs: 1.1 10*3/uL (ref 0.7–4.0)
MCHC: 33.5 g/dL (ref 30.0–36.0)
MCV: 83.6 fl (ref 78.0–100.0)
Monocytes Absolute: 0.5 10*3/uL (ref 0.1–1.0)
Monocytes Relative: 15.3 % — ABNORMAL HIGH (ref 3.0–12.0)
Neutro Abs: 1.6 10*3/uL (ref 1.4–7.7)
Neutrophils Relative %: 47.1 % (ref 43.0–77.0)
Platelets: 199 10*3/uL (ref 150.0–400.0)
RBC: 4.83 Mil/uL (ref 4.22–5.81)
RDW: 14.2 % (ref 11.5–15.5)
WBC: 3.3 10*3/uL — ABNORMAL LOW (ref 4.0–10.5)

## 2019-02-02 LAB — HEPATIC FUNCTION PANEL
ALT: 20 U/L (ref 0–53)
AST: 21 U/L (ref 0–37)
Albumin: 4.3 g/dL (ref 3.5–5.2)
Alkaline Phosphatase: 73 U/L (ref 39–117)
Bilirubin, Direct: 0.1 mg/dL (ref 0.0–0.3)
Total Bilirubin: 0.4 mg/dL (ref 0.2–1.2)
Total Protein: 6.7 g/dL (ref 6.0–8.3)

## 2019-02-02 LAB — LIPID PANEL
Cholesterol: 121 mg/dL (ref 0–200)
HDL: 48.1 mg/dL (ref >39.00)
LDL Cholesterol: 63 mg/dL (ref 0–99)
NonHDL: 73.09
Total CHOL/HDL Ratio: 3
Triglycerides: 48 mg/dL (ref 0.0–149.0)
VLDL: 9.6 mg/dL (ref 0.0–40.0)

## 2019-02-02 LAB — VITAMIN D 25 HYDROXY (VIT D DEFICIENCY, FRACTURES): VITD: 34.22 ng/mL (ref 30.00–100.00)

## 2019-02-02 LAB — PSA: PSA: 1.97 ng/mL (ref 0.10–4.00)

## 2019-02-02 LAB — TESTOSTERONE: Testosterone: 410.38 ng/dL (ref 300.00–890.00)

## 2019-02-02 LAB — TSH: TSH: 1.18 u[IU]/mL (ref 0.35–4.50)

## 2019-02-02 LAB — VITAMIN B12: Vitamin B-12: 455 pg/mL (ref 211–911)

## 2019-02-02 LAB — HEMOGLOBIN A1C: Hgb A1c MFr Bld: 7 % — ABNORMAL HIGH (ref 4.6–6.5)

## 2019-02-25 ENCOUNTER — Ambulatory Visit: Payer: 59 | Admitting: Podiatry

## 2019-03-29 ENCOUNTER — Encounter: Payer: Self-pay | Admitting: Internal Medicine

## 2019-03-29 MED ORDER — METFORMIN HCL 500 MG PO TABS: 1000.0000 mg | ORAL_TABLET | Freq: Two times a day (BID) | ORAL | 3 refills | Status: DC

## 2019-04-25 ENCOUNTER — Ambulatory Visit: Payer: Self-pay | Admitting: Internal Medicine

## 2019-04-25 DIAGNOSIS — Z20822 Contact with and (suspected) exposure to covid-19: Secondary | ICD-10-CM

## 2019-04-25 NOTE — Addendum Note (Signed)
Addended by: Denyce Robert on: 04/25/2019 01:55 PM   Modules accepted: Orders

## 2019-04-25 NOTE — Telephone Encounter (Signed)
[facility] for covid referral for testing

## 2019-04-25 NOTE — Telephone Encounter (Signed)
Please contact pt to schedule covid19 testing due to symptoms. Thanks!

## 2019-04-25 NOTE — Telephone Encounter (Signed)
Pt calling to get an appointment. States that he has cough and chills and headache that started on Friday. Pt now has no sense of taste or smell  Cough runny nose cough took cough medication. No cough now. Sat/Sun loss taste of smell and taste, runny nose and stuffy nose.  Pt stated he has no cough but now his only symptoms are loss of taste and smell, runny nose and intermittent nasal congestion. Pt is a diabetic and has HTN. Pt stated his manager is requiring Covid testing prior to coming back to work.  Care advice given and pt verbalized understanding. Called PCP office and spoke to [place] who asked to route triage note to office and to tell pt that they will call him back. Pt verbalized understanding.  Reason for Disposition . [1] COVID-19 infection suspected by caller or triager AND [2] mild symptoms (cough, fever, or others) AND [6] no complications or SOB  Answer Assessment - Initial Assessment Questions 1. COVID-19 DIAGNOSIS: "Who made your Coronavirus (COVID-19) diagnosis?" "Was it confirmed by a positive lab test?" If not diagnosed by a HCP, ask "Are there lots of cases (community spread) where you live?" (See public health department website, if unsure)     n/a 2. ONSET: "When did the COVID-19 symptoms start?"      Friday 3. WORST SYMPTOM: "What is your worst symptom?" (e.g., cough, fever, shortness of breath, muscle aches)     Loss of smell and taste 4. COUGH: "Do you have a cough?" If so, ask: "How bad is the cough?"       no 5. FEVER: "Do you have a fever?" If so, ask: "What is your temperature, how was it measured, and when did it start?"     no 6. RESPIRATORY STATUS: "Describe your breathing?" (e.g., shortness of breath, wheezing, unable to speak)      none 7. BETTER-SAME-WORSE: "Are you getting better, staying the same or getting worse compared to yesterday?"  If getting worse, ask, "In what way?"     better 8. HIGH RISK DISEASE: "Do you have any chronic medical problems?"  (e.g., asthma, heart or lung disease, weak immune system, etc.)     Diabetic HTN 9. PREGNANCY: "Is there any chance you are pregnant?" "When was your last menstrual period?"     n/a 10. OTHER SYMPTOMS: "Do you have any other symptoms?"  (e.g., chills, fatigue, headache, loss of smell or taste, muscle pain, sore throat)       Loss of smell and taste, Runny nose, intermittent nasal congestion  Protocols used: CORONAVIRUS (COVID-19) DIAGNOSED OR SUSPECTED-A-AH

## 2019-04-25 NOTE — Telephone Encounter (Signed)
Scheduled patient for COVID 19 test tomorrow at [facility] at 10:30 am.  Testing protocol reviewed with patient.

## 2019-04-26 ENCOUNTER — Other Ambulatory Visit: Payer: 59

## 2019-04-26 DIAGNOSIS — Z20822 Contact with and (suspected) exposure to covid-19: Secondary | ICD-10-CM

## 2019-04-27 ENCOUNTER — Encounter: Payer: Self-pay | Admitting: Internal Medicine

## 2019-04-30 LAB — NOVEL CORONAVIRUS, NAA: SARS-CoV-2, NAA: DETECTED — AB

## 2019-05-02 ENCOUNTER — Telehealth: Payer: Self-pay

## 2019-05-02 NOTE — Telephone Encounter (Signed)
-----   Message from Biagio Borg, MD sent at 04/30/2019  7:20 PM EDT ----- Left message on MyChart, pt to cont same tx  Except  The test results show that your current treatment is OK, except the COVID testing was Positive.  Please remember to go to the ER for any worsening shortness of breath or other more severe symptoms.  Please also remain at home quarantined until at least 7 days AFTER your symptoms have resolved, or 14 days from the start of the symptoms.    Howard [name] to please inform pt

## 2019-05-02 NOTE — Telephone Encounter (Signed)
Pt has viewed results via MyChart  

## 2019-08-03 ENCOUNTER — Other Ambulatory Visit (INDEPENDENT_AMBULATORY_CARE_PROVIDER_SITE_OTHER): Payer: 59

## 2019-08-03 DIAGNOSIS — E119 Type 2 diabetes mellitus without complications: Secondary | ICD-10-CM

## 2019-08-03 LAB — BASIC METABOLIC PANEL
BUN: 12 mg/dL (ref 6–23)
CO2: 28 mEq/L (ref 19–32)
Calcium: 9.2 mg/dL (ref 8.4–10.5)
Chloride: 104 mEq/L (ref 96–112)
Creatinine, Ser: 0.91 mg/dL (ref 0.40–1.50)
GFR: 103.77 mL/min (ref >60.00)
Glucose, Bld: 126 mg/dL — ABNORMAL HIGH (ref 70–99)
Potassium: 4.2 mEq/L (ref 3.5–5.1)
Sodium: 139 mEq/L (ref 135–145)

## 2019-08-03 LAB — HEPATIC FUNCTION PANEL
ALT: 20 U/L (ref 0–53)
AST: 21 U/L (ref 0–37)
Albumin: 4.2 g/dL (ref 3.5–5.2)
Alkaline Phosphatase: 68 U/L (ref 39–117)
Bilirubin, Direct: 0.1 mg/dL (ref 0.0–0.3)
Total Bilirubin: 0.4 mg/dL (ref 0.2–1.2)
Total Protein: 6.8 g/dL (ref 6.0–8.3)

## 2019-08-03 LAB — LIPID PANEL
Cholesterol: 115 mg/dL (ref 0–200)
HDL: 44.9 mg/dL (ref >39.00)
LDL Cholesterol: 56 mg/dL (ref 0–99)
NonHDL: 70.09
Total CHOL/HDL Ratio: 3
Triglycerides: 71 mg/dL (ref 0.0–149.0)
VLDL: 14.2 mg/dL (ref 0.0–40.0)

## 2019-08-03 LAB — HEMOGLOBIN A1C: Hgb A1c MFr Bld: 7.1 % — ABNORMAL HIGH (ref 4.6–6.5)

## 2019-08-04 ENCOUNTER — Ambulatory Visit (INDEPENDENT_AMBULATORY_CARE_PROVIDER_SITE_OTHER): Payer: 59 | Admitting: Internal Medicine

## 2019-08-04 ENCOUNTER — Other Ambulatory Visit: Payer: Self-pay

## 2019-08-04 ENCOUNTER — Encounter: Payer: Self-pay | Admitting: Internal Medicine

## 2019-08-04 VITALS — BP 126/86 | HR 71 | Temp 98.4°F | Ht 74.0 in | Wt 237.0 lb

## 2019-08-04 DIAGNOSIS — E119 Type 2 diabetes mellitus without complications: Secondary | ICD-10-CM

## 2019-08-04 DIAGNOSIS — I10 Essential (primary) hypertension: Secondary | ICD-10-CM

## 2019-08-04 DIAGNOSIS — Z0000 Encounter for general adult medical examination without abnormal findings: Secondary | ICD-10-CM | POA: Diagnosis not present

## 2019-08-04 DIAGNOSIS — E611 Iron deficiency: Secondary | ICD-10-CM

## 2019-08-04 DIAGNOSIS — E785 Hyperlipidemia, unspecified: Secondary | ICD-10-CM

## 2019-08-04 DIAGNOSIS — E559 Vitamin D deficiency, unspecified: Secondary | ICD-10-CM

## 2019-08-04 DIAGNOSIS — E538 Deficiency of other specified B group vitamins: Secondary | ICD-10-CM

## 2019-08-04 NOTE — Assessment & Plan Note (Signed)
stable overall by history and exam, recent data reviewed with pt, and pt to continue medical treatment as before,  to f/u any worsening symptoms or concerns  

## 2019-08-04 NOTE — Patient Instructions (Signed)
Please continue all other medications as before, and refills have been done if requested.  Please have the pharmacy call with any other refills you may need.  Please continue your efforts at being more active, low cholesterol diet, and weight control.  You are otherwise up to date with prevention measures today.  Please keep your appointments with your specialists as you may have planned  Please return in 6 months, or sooner if needed, with Lab testing done 3-5 days before  

## 2019-08-04 NOTE — Progress Notes (Signed)
Subjective:    Patient ID: Howard [name], male    DOB: May 02, 1961, 58 y.o.   MRN: [id number]  HPI   Here to f/u; overall doing ok,  Pt denies chest pain, increasing sob or doe, wheezing, orthopnea, PND, increased LE swelling, palpitations, dizziness or syncope.  Pt denies new neurological symptoms such as new headache, or facial or extremity weakness or numbness.  Pt denies polydipsia, polyuria, or low sugar episode.  Pt states overall good compliance with meds, mostly trying to follow appropriate diet, with wt overall stable,  but little exercise however.  S/p covid 08 May 2019 infection with mild URi symptoms and loss of taste and smell, only lasted 1 wk. 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  Declines flu and prevnar for now. Sees podiatry - no neuropathy.  Past Medical History:  Diagnosis Date  . Allergic rhinitis, cause unspecified 01/15/2012  . Allergy   . Cholelithiasis 01/20/2018  . Diabetes mellitus   . DIABETES MELLITUS, TYPE II 05/24/2007   Qualifier: Diagnosis of  By: Dance CMA ([place]), Kim    . HYPERLIPIDEMIA 10/06/2007   Qualifier: Diagnosis of  By: Alain Marion MD, Evie Lacks   . HYPERTENSION 02/06/2008   Qualifier: Diagnosis of  By: Alain Marion MD, Evie Lacks   . Lumbar disc disease    s/p surgury approx 1992  . PSA, INCREASED 05/11/2009   Qualifier: Diagnosis of  By: Ronnald Ramp MD, Arvid Right.    Past Surgical History:  Procedure Laterality Date  . BACK SURGERY  1989   Lumbar/Ruptured Disk  . HERNIA REPAIR  may 2012   left inguinal  . KNEE ARTHROSCOPY Right 2014  . NASAL SEPTUM SURGERY      reports that he has never smoked. He has never used smokeless tobacco. He reports current alcohol use. He reports that he does not use drugs. family history includes Cancer in his mother; Diabetes in his father and mother; Hypertension in his father and mother. No Known Allergies Current Outpatient Medications on File Prior to Visit   Medication Sig Dispense Refill  . aspirin 81 MG EC tablet Take 1 tablet (81 mg total) by mouth daily. Swallow whole. 30 tablet 12  . Blood Glucose Monitoring Suppl (ONE TOUCH ULTRA 2) W/DEVICE KIT Use as directed daily 1 each 0  . Diclofenac Sodium (PENNSAID) 2 % SOLN Place 2 g onto the skin 2 (two) times daily. 112 g 3  . glucose blood (ONE TOUCH ULTRA TEST) test strip Use as instructed once daily. Diagnosis 250.0 100 each 12  . guaiFENesin-codeine (CHERATUSSIN AC) 100-10 MG/5ML syrup Take 5 mLs by mouth 3 (three) times daily as needed for cough. 180 mL 0  . Lancets MISC 1 application by Does not apply route daily. 100 each 12  . lisinopril (PRINIVIL,ZESTRIL) 20 MG tablet Take 1 tablet (20 mg total) by mouth daily. 90 tablet 3  . metFORMIN (GLUCOPHAGE) 500 MG tablet Take 2 tablets (1,000 mg total) by mouth 2 (two) times daily with a meal. 360 tablet 3  . rosuvastatin (CRESTOR) 40 MG tablet Take 1 tablet (40 mg total) by mouth daily. 90 tablet 3  . triamcinolone (NASACORT) 55 MCG/ACT AERO nasal inhaler Place 2 sprays into the nose daily. 1 Inhaler 12  . Vitamin D, Ergocalciferol, (DRISDOL) 50000 units CAPS capsule TAKE 1 CAPSULE BY MOUTH ONCE A WEEK 12 capsule 0   No current facility-administered medications on file prior to visit.  Review of Systems  Constitutional: Negative for other unusual diaphoresis or sweats HENT: Negative for ear discharge or swelling Eyes: Negative for other worsening visual disturbances Respiratory: Negative for stridor or other swelling  Gastrointestinal: Negative for worsening distension or other blood Genitourinary: Negative for retention or other urinary change Musculoskeletal: Negative for other MSK pain or swelling Skin: Negative for color change or other new lesions Neurological: Negative for worsening tremors and other numbness  Psychiatric/Behavioral: Negative for worsening agitation or other fatigue All otherwise neg per pt    Objective:   Physical  Exam BP 126/86   Pulse 71   Temp 98.4 F (36.9 C) (Oral)   Ht _0  (1.88 m)   Wt 237 lb (107.5 kg)   SpO2 96%   BMI 30.43 kg/m  VS noted,  Constitutional: Pt appears in NAD HENT: Head: NCAT.  Right Ear: External ear normal.  Left Ear: External ear normal.  Eyes: . Pupils are equal, round, and reactive to light. Conjunctivae and EOM are normal Nose: without d/c or deformity Neck: Neck supple. Gross normal ROM Cardiovascular: Normal rate and regular rhythm.   Pulmonary/Chest: Effort normal and breath sounds without rales or wheezing.  Abd:  Soft, NT, ND, + BS, no organomegaly Neurological: Pt is alert. At baseline orientation, motor grossly intact Skin: Skin is warm. No rashes, other new lesions, no LE edema Psychiatric: Pt behavior is normal without agitation  All otherwise neg per pt  Lab Results  Component Value Date   WBC 3.3 (L) 02/02/2019   HGB 13.5 02/02/2019   HCT 40.3 02/02/2019   PLT 199.0 02/02/2019   GLUCOSE 126 (H) 08/03/2019   CHOL 115 08/03/2019   TRIG 71.0 08/03/2019   HDL 44.90 08/03/2019   LDLDIRECT 127.5 04/02/2007   LDLCALC 56 08/03/2019   ALT 20 08/03/2019   AST 21 08/03/2019   NA 139 08/03/2019   K 4.2 08/03/2019   CL 104 08/03/2019   CREATININE 0.91 08/03/2019   BUN 12 08/03/2019   CO2 28 08/03/2019   TSH 1.18 02/02/2019   PSA 1.97 02/02/2019   HGBA1C 7.1 (H) 08/03/2019   MICROALBUR 2.4 (H) 02/02/2019         Assessment & Plan:

## 2019-11-27 ENCOUNTER — Encounter: Payer: Self-pay | Admitting: Internal Medicine

## 2019-11-28 NOTE — Telephone Encounter (Signed)
Please let me know what kind of visit you would like, in person or e- visit. Pt has blood in semen.

## 2020-01-02 ENCOUNTER — Encounter: Payer: Self-pay | Admitting: Internal Medicine

## 2020-01-02 LAB — HM DIABETES EYE EXAM

## 2020-01-04 LAB — HM DIABETES EYE EXAM

## 2020-01-05 ENCOUNTER — Telehealth: Payer: Self-pay | Admitting: *Deleted

## 2020-01-05 NOTE — Telephone Encounter (Signed)
A user error has taken place.Marland KitchenJohny [name]

## 2020-01-24 ENCOUNTER — Other Ambulatory Visit: Payer: Self-pay

## 2020-01-24 ENCOUNTER — Other Ambulatory Visit (INDEPENDENT_AMBULATORY_CARE_PROVIDER_SITE_OTHER): Payer: 59

## 2020-01-24 DIAGNOSIS — E119 Type 2 diabetes mellitus without complications: Secondary | ICD-10-CM

## 2020-01-24 DIAGNOSIS — E559 Vitamin D deficiency, unspecified: Secondary | ICD-10-CM

## 2020-01-24 DIAGNOSIS — E611 Iron deficiency: Secondary | ICD-10-CM

## 2020-01-24 DIAGNOSIS — Z0000 Encounter for general adult medical examination without abnormal findings: Secondary | ICD-10-CM | POA: Diagnosis not present

## 2020-01-24 DIAGNOSIS — E538 Deficiency of other specified B group vitamins: Secondary | ICD-10-CM | POA: Diagnosis not present

## 2020-01-24 LAB — URINALYSIS, ROUTINE W REFLEX MICROSCOPIC
Bilirubin Urine: NEGATIVE
Hgb urine dipstick: NEGATIVE
Ketones, ur: NEGATIVE
Leukocytes,Ua: NEGATIVE
Nitrite: NEGATIVE
RBC / HPF: NONE SEEN (ref 0–?)
Specific Gravity, Urine: >=1.03 — AB (ref 1.000–1.030)
Total Protein, Urine: NEGATIVE
Urine Glucose: 100 — AB
Urobilinogen, UA: 0.2 (ref 0.0–1.0)
pH: 5 (ref 5.0–8.0)

## 2020-01-24 LAB — IBC PANEL
Iron: 76 ug/dL (ref 42–165)
Saturation Ratios: 22.9 % (ref 20.0–50.0)
Transferrin: 237 mg/dL (ref 212.0–360.0)

## 2020-01-24 LAB — LIPID PANEL
Cholesterol: 128 mg/dL (ref 0–200)
HDL: 47.5 mg/dL (ref >39.00)
LDL Cholesterol: 67 mg/dL (ref 0–99)
NonHDL: 80.2
Total CHOL/HDL Ratio: 3
Triglycerides: 64 mg/dL (ref 0.0–149.0)
VLDL: 12.8 mg/dL (ref 0.0–40.0)

## 2020-01-24 LAB — CBC WITH DIFFERENTIAL/PLATELET
Basophils Absolute: 0 10*3/uL (ref 0.0–0.1)
Basophils Relative: 0.9 % (ref 0.0–3.0)
Eosinophils Absolute: 0.1 10*3/uL (ref 0.0–0.7)
Eosinophils Relative: 1.8 % (ref 0.0–5.0)
HCT: 40.8 % (ref 39.0–52.0)
Hemoglobin: 13.5 g/dL (ref 13.0–17.0)
Lymphocytes Relative: 33 % (ref 12.0–46.0)
Lymphs Abs: 1.3 10*3/uL (ref 0.7–4.0)
MCHC: 33.1 g/dL (ref 30.0–36.0)
MCV: 84.8 fl (ref 78.0–100.0)
Monocytes Absolute: 0.5 10*3/uL (ref 0.1–1.0)
Monocytes Relative: 12.3 % — ABNORMAL HIGH (ref 3.0–12.0)
Neutro Abs: 2.1 10*3/uL (ref 1.4–7.7)
Neutrophils Relative %: 52 % (ref 43.0–77.0)
Platelets: 189 10*3/uL (ref 150.0–400.0)
RBC: 4.81 Mil/uL (ref 4.22–5.81)
RDW: 14.3 % (ref 11.5–15.5)
WBC: 4 10*3/uL (ref 4.0–10.5)

## 2020-01-24 LAB — BASIC METABOLIC PANEL
BUN: 14 mg/dL (ref 6–23)
CO2: 29 mEq/L (ref 19–32)
Calcium: 9.4 mg/dL (ref 8.4–10.5)
Chloride: 106 mEq/L (ref 96–112)
Creatinine, Ser: 0.98 mg/dL (ref 0.40–1.50)
GFR: 95.1 mL/min (ref >60.00)
Glucose, Bld: 144 mg/dL — ABNORMAL HIGH (ref 70–99)
Potassium: 4.1 mEq/L (ref 3.5–5.1)
Sodium: 141 mEq/L (ref 135–145)

## 2020-01-24 LAB — MICROALBUMIN / CREATININE URINE RATIO
Creatinine,U: 211 mg/dL
Microalb Creat Ratio: 0.8 mg/g (ref 0.0–30.0)
Microalb, Ur: 1.7 mg/dL (ref 0.0–1.9)

## 2020-01-24 LAB — HEPATIC FUNCTION PANEL
ALT: 22 U/L (ref 0–53)
AST: 22 U/L (ref 0–37)
Albumin: 4.3 g/dL (ref 3.5–5.2)
Alkaline Phosphatase: 79 U/L (ref 39–117)
Bilirubin, Direct: 0.1 mg/dL (ref 0.0–0.3)
Total Bilirubin: 0.4 mg/dL (ref 0.2–1.2)
Total Protein: 6.6 g/dL (ref 6.0–8.3)

## 2020-01-24 LAB — HEMOGLOBIN A1C: Hgb A1c MFr Bld: 7.7 % — ABNORMAL HIGH (ref 4.6–6.5)

## 2020-01-24 LAB — VITAMIN D 25 HYDROXY (VIT D DEFICIENCY, FRACTURES): VITD: 22.03 ng/mL — ABNORMAL LOW (ref 30.00–100.00)

## 2020-01-24 LAB — TSH: TSH: 2.86 u[IU]/mL (ref 0.35–4.50)

## 2020-01-24 LAB — PSA: PSA: 2.01 ng/mL (ref 0.10–4.00)

## 2020-01-24 LAB — VITAMIN B12: Vitamin B-12: 378 pg/mL (ref 211–911)

## 2020-01-26 ENCOUNTER — Ambulatory Visit: Payer: 59 | Admitting: Internal Medicine

## 2020-01-26 ENCOUNTER — Other Ambulatory Visit: Payer: Self-pay

## 2020-01-26 ENCOUNTER — Encounter: Payer: Self-pay | Admitting: Internal Medicine

## 2020-01-26 VITALS — BP 130/82 | HR 60 | Temp 98.0°F | Ht 74.0 in | Wt 242.2 lb

## 2020-01-26 DIAGNOSIS — E119 Type 2 diabetes mellitus without complications: Secondary | ICD-10-CM

## 2020-01-26 DIAGNOSIS — Z0000 Encounter for general adult medical examination without abnormal findings: Secondary | ICD-10-CM

## 2020-01-26 DIAGNOSIS — E559 Vitamin D deficiency, unspecified: Secondary | ICD-10-CM

## 2020-01-26 DIAGNOSIS — N529 Male erectile dysfunction, unspecified: Secondary | ICD-10-CM | POA: Diagnosis not present

## 2020-01-26 HISTORY — DX: Vitamin D deficiency, unspecified: E55.9

## 2020-01-26 MED ORDER — TADALAFIL 20 MG PO TABS: 20.0000 mg | ORAL_TABLET | Freq: Every day | ORAL | 11 refills | Status: DC | PRN

## 2020-01-26 MED ORDER — VITAMIN D (ERGOCALCIFEROL) 1.25 MG (50000 UNIT) PO CAPS: 50000.0000 [IU] | ORAL_CAPSULE | ORAL | 0 refills | Status: DC

## 2020-01-26 NOTE — Assessment & Plan Note (Signed)
For cialis prn

## 2020-01-26 NOTE — Progress Notes (Signed)
Subjective:    Patient ID: Howard [name], male    DOB: 01-Jul-1961, 59 y.o.   MRN: [id number]  HPI  Here for wellness and f/u;  Overall doing ok;  Pt denies Chest pain, worsening SOB, DOE, wheezing, orthopnea, PND, worsening LE edema, palpitations, dizziness or syncope.  Pt denies neurological change such as new headache, facial or extremity weakness.  Pt denies polydipsia, polyuria, or low sugar symptoms. Pt states overall good compliance with treatment and medications, good tolerability, and has been trying to follow appropriate diet.  Pt denies worsening depressive symptoms, suicidal ideation or panic. No fever, night sweats, wt loss, loss of appetite, or other constitutional symptoms.  Pt states good ability with ADL's, has low fall risk, home safety reviewed and adequate, no other significant changes in hearing or vision, and only occasionally active with exercise. No new complaints except with ongoing ED symtpoms, asks for cialis Past Medical History:  Diagnosis Date  . Allergic rhinitis, cause unspecified 01/15/2012  . Allergy   . Cholelithiasis 01/20/2018  . Diabetes mellitus   . DIABETES MELLITUS, TYPE II 05/24/2007   Qualifier: Diagnosis of  By: Dance CMA ([place]), Kim    . HYPERLIPIDEMIA 10/06/2007   Qualifier: Diagnosis of  By: Alain Marion MD, Evie Lacks   . HYPERTENSION 02/06/2008   Qualifier: Diagnosis of  By: Alain Marion MD, Evie Lacks   . Lumbar disc disease    s/p surgury approx 1992  . PSA, INCREASED 05/11/2009   Qualifier: Diagnosis of  By: Ronnald Ramp MD, Arvid Right.    Past Surgical History:  Procedure Laterality Date  . BACK SURGERY  1989   Lumbar/Ruptured Disk  . HERNIA REPAIR  may 2012   left inguinal  . KNEE ARTHROSCOPY Right 2014  . NASAL SEPTUM SURGERY      reports that he has never smoked. He has never used smokeless tobacco. He reports current alcohol use. He reports that he does not use drugs. family history includes Cancer in his mother; Diabetes in his father and mother;  Hypertension in his father and mother. No Known Allergies Current Outpatient Medications on File Prior to Visit  Medication Sig Dispense Refill  . aspirin 81 MG EC tablet Take 1 tablet (81 mg total) by mouth daily. Swallow whole. 30 tablet 12  . Blood Glucose Monitoring Suppl (ONE TOUCH ULTRA 2) W/DEVICE KIT Use as directed daily 1 each 0  . Diclofenac Sodium (PENNSAID) 2 % SOLN Place 2 g onto the skin 2 (two) times daily. 112 g 3  . glucose blood (ONE TOUCH ULTRA TEST) test strip Use as instructed once daily. Diagnosis 250.0 100 each 12  . guaiFENesin-codeine (CHERATUSSIN AC) 100-10 MG/5ML syrup Take 5 mLs by mouth 3 (three) times daily as needed for cough. 180 mL 0  . Lancets MISC 1 application by Does not apply route daily. 100 each 12  . lisinopril (PRINIVIL,ZESTRIL) 20 MG tablet Take 1 tablet (20 mg total) by mouth daily. 90 tablet 3  . metFORMIN (GLUCOPHAGE) 500 MG tablet Take 2 tablets (1,000 mg total) by mouth 2 (two) times daily with a meal. 360 tablet 3  . rosuvastatin (CRESTOR) 40 MG tablet Take 1 tablet (40 mg total) by mouth daily. 90 tablet 3  . triamcinolone (NASACORT) 55 MCG/ACT AERO nasal inhaler Place 2 sprays into the nose daily. 1 Inhaler 12   No current facility-administered medications on file prior to visit.   Review of Systems All otherwise neg per pt     Objective:  Physical Exam BP 130/82   Pulse 60   Temp 98 F (36.7 C)   Ht 6' 2"  (1.88 m)   Wt 242 lb 3.2 oz (109.9 kg)   SpO2 98%   BMI 31.10 kg/m  VS noted,  Constitutional: Pt appears in NAD HENT: Head: NCAT.  Right Ear: External ear normal.  Left Ear: External ear normal.  Eyes: . Pupils are equal, round, and reactive to light. Conjunctivae and EOM are normal Nose: without d/c or deformity Neck: Neck supple. Gross normal ROM Cardiovascular: Normal rate and regular rhythm.   Pulmonary/Chest: Effort normal and breath sounds without rales or wheezing.  Abd:  Soft, NT, ND, + BS, no  organomegaly Neurological: Pt is alert. At baseline orientation, motor grossly intact Skin: Skin is warm. No rashes, other new lesions, no LE edema Psychiatric: Pt behavior is normal without agitation  All otherwise neg per pt Lab Results  Component Value Date   WBC 4.0 01/24/2020   HGB 13.5 01/24/2020   HCT 40.8 01/24/2020   PLT 189.0 01/24/2020   GLUCOSE 144 (H) 01/24/2020   CHOL 128 01/24/2020   TRIG 64.0 01/24/2020   HDL 47.50 01/24/2020   LDLDIRECT 127.5 04/02/2007   LDLCALC 67 01/24/2020   ALT 22 01/24/2020   AST 22 01/24/2020   NA 141 01/24/2020   K 4.1 01/24/2020   CL 106 01/24/2020   CREATININE 0.98 01/24/2020   BUN 14 01/24/2020   CO2 29 01/24/2020   TSH 2.86 01/24/2020   PSA 2.01 01/24/2020   HGBA1C 7.7 (H) 01/24/2020   MICROALBUR 1.7 01/24/2020          Assessment & Plan:

## 2020-01-26 NOTE — Assessment & Plan Note (Signed)
Please take Vitamin D 50000 units weekly for 12 weeks, then plan to change to OTC Vitamin D3 at 2000 units per day, indefinitely. 

## 2020-01-26 NOTE — Patient Instructions (Signed)
Please take Vitamin D 50000 units weekly for 12 weeks, then plan to change to OTC Vitamin D3 at 2000 units per day, indefinitely.  Please take all new medication as prescribed - the cialis as needed  Please continue all other medications as before, and refills have been done if requested.  Please have the pharmacy call with any other refills you may need.  Please continue your efforts at being more active, low cholesterol diet, and weight control.  You are otherwise up to date with prevention measures today.  Please keep your appointments with your specialists as you may have planned  Please make an Appointment to return in 6 months, or sooner if needed, also with Lab Appointment for testing done 3-5 days before at the [facility] (so this is for TWO appointments - please see the scheduling desk as you leave)

## 2020-01-26 NOTE — Assessment & Plan Note (Signed)
Mild uncontrolled, declines increased med tx, plans to start being more active,  to f/u any worsening symptoms or concerns

## 2020-01-26 NOTE — Assessment & Plan Note (Signed)

## 2020-02-08 ENCOUNTER — Encounter: Payer: Self-pay | Admitting: Family Medicine

## 2020-02-08 ENCOUNTER — Ambulatory Visit: Payer: 59 | Admitting: Family Medicine

## 2020-02-08 ENCOUNTER — Other Ambulatory Visit: Payer: Self-pay

## 2020-02-08 ENCOUNTER — Other Ambulatory Visit: Payer: Self-pay | Admitting: Family Medicine

## 2020-02-08 ENCOUNTER — Ambulatory Visit (INDEPENDENT_AMBULATORY_CARE_PROVIDER_SITE_OTHER): Payer: 59

## 2020-02-08 VITALS — BP 156/90 | HR 75 | Ht 74.0 in | Wt 242.0 lb

## 2020-02-08 DIAGNOSIS — M6749 Ganglion, multiple sites: Secondary | ICD-10-CM | POA: Diagnosis not present

## 2020-02-08 DIAGNOSIS — M25561 Pain in right knee: Secondary | ICD-10-CM

## 2020-02-08 DIAGNOSIS — M7139 Other bursal cyst, multiple sites: Secondary | ICD-10-CM

## 2020-02-08 DIAGNOSIS — M6789 Other specified disorders of synovium and tendon, multiple sites: Secondary | ICD-10-CM | POA: Diagnosis not present

## 2020-02-08 NOTE — Assessment & Plan Note (Signed)
Aspiration done today.  Tolerated the procedure well.  No significant underlying arthritic changes but even more bone-on-bone osteoarthritic changes of the contralateral knee.  Discussed compression, icing regimen, which activities to do which wants to avoid.  Patient is to increase activity slowly over the course of the next several weeks.  Follow-up again in 6 to 8 weeks to further evaluate.  X-rays ordered today to further evaluate to make sure no significant bony abnormality noted.

## 2020-02-08 NOTE — Progress Notes (Signed)
[facility] [address] [place] Phone: [phone] Subjective:   I Howard [name] am serving as a Education administrator for Dr. Hulan Saas.  This visit occurred during the SARS-CoV-2 public health emergency.  Safety protocols were in place, including screening questions prior to the visit, additional usage of staff PPE, and extensive cleaning of exam room while observing appropriate contact time as indicated for disinfecting solutions.   I'm seeing this patient by the request  of:  Biagio Borg, MD  CC: Right knee bump  RAQ:[id number]   02/10/2018 Left knee arthritis.  Mostly medial compartment.  May need any medial unloader brace and went to customize secondary to the abnormal thigh to calf ratio.  Patient was to continue with conservative therapy.  Hold on any type of injection.  Patient will follow-up with me again 2 to 3 months  Update 02/08/2020 Howard [name] is a 59 y.o. male coming in with complaint of right knee arthritis. Patient states he has a knot on the medial side. Didn't fall or any injury that he can remember. Laying down at night the knot is painful. Taking tylenol/ Ibuprofen for pain. States it is bone. No swelling noted.  Patient states that over the course the last several weeks it started to increase in size.  Patient denies any fevers, chills, any abnormal weight loss.  Does not remember any true injury.      Past Medical History:  Diagnosis Date  . Allergic rhinitis, cause unspecified 01/15/2012  . Allergy   . Cholelithiasis 01/20/2018  . Diabetes mellitus   . DIABETES MELLITUS, TYPE II 05/24/2007   Qualifier: Diagnosis of  By: Dance CMA ([place]), Kim    . HYPERLIPIDEMIA 10/06/2007   Qualifier: Diagnosis of  By: Alain Marion MD, Evie Lacks   . HYPERTENSION 02/06/2008   Qualifier: Diagnosis of  By: Alain Marion MD, Evie Lacks   . Lumbar disc disease    s/p surgury approx 1992  . PSA, INCREASED 05/11/2009   Qualifier: Diagnosis of  By:  Ronnald Ramp MD, Arvid Right.   . Vitamin D deficiency 01/26/2020   Past Surgical History:  Procedure Laterality Date  . BACK SURGERY  1989   Lumbar/Ruptured Disk  . HERNIA REPAIR  may 2012   left inguinal  . KNEE ARTHROSCOPY Right 2014  . NASAL SEPTUM SURGERY     Social History   Socioeconomic History  . Marital status: Married    Spouse name: Not on file  . Number of children: Not on file  . Years of education: Not on file  . Highest education level: Not on file  Occupational History  . Not on file  Tobacco Use  . Smoking status: Never Smoker  . Smokeless tobacco: Never Used  Substance and Sexual Activity  . Alcohol use: Yes    Comment: very rare   . Drug use: No  . Sexual activity: Not on file  Other Topics Concern  . Not on file  Social History Narrative  . Not on file   Social Determinants of Health   Financial Resource Strain:   . Difficulty of Paying Living Expenses:   Food Insecurity:   . Worried About Charity fundraiser in the Last Year:   . Arboriculturist in the Last Year:   Transportation Needs:   . Film/video editor (Medical):   Marland Kitchen Lack of Transportation (Non-Medical):   Physical Activity:   . Days of Exercise per Week:   .  Minutes of Exercise per Session:   Stress:   . Feeling of Stress :   Social Connections:   . Frequency of Communication with Friends and Family:   . Frequency of Social Gatherings with Friends and Family:   . Attends Religious Services:   . Active Member of Clubs or Organizations:   . Attends Archivist Meetings:   Marland Kitchen Marital Status:    No Known Allergies Family History  Problem Relation Age of Onset  . Hypertension Mother   . Diabetes Mother   . Cancer Mother   . Hypertension Father   . Diabetes Father   . Colon cancer Neg Hx     Current Outpatient Medications (Endocrine & Metabolic):  .  metFORMIN (GLUCOPHAGE) 500 MG tablet, Take 2 tablets (1,000 mg total) by mouth 2 (two) times daily with a meal.  Current  Outpatient Medications (Cardiovascular):  .  lisinopril (PRINIVIL,ZESTRIL) 20 MG tablet, Take 1 tablet (20 mg total) by mouth daily. .  rosuvastatin (CRESTOR) 40 MG tablet, Take 1 tablet (40 mg total) by mouth daily. .  tadalafil (CIALIS) 20 MG tablet, Take 1 tablet (20 mg total) by mouth daily as needed for erectile dysfunction.  Current Outpatient Medications (Respiratory):  .  guaiFENesin-codeine (CHERATUSSIN AC) 100-10 MG/5ML syrup, Take 5 mLs by mouth 3 (three) times daily as needed for cough. .  triamcinolone (NASACORT) 55 MCG/ACT AERO nasal inhaler, Place 2 sprays into the nose daily.  Current Outpatient Medications (Analgesics):  .  aspirin 81 MG EC tablet, Take 1 tablet (81 mg total) by mouth daily. Swallow whole.   Current Outpatient Medications (Other):  .  Blood Glucose Monitoring Suppl (ONE TOUCH ULTRA 2) W/DEVICE KIT, Use as directed daily .  Diclofenac Sodium (PENNSAID) 2 % SOLN, Place 2 g onto the skin 2 (two) times daily. Marland Kitchen  glucose blood (ONE TOUCH ULTRA TEST) test strip, Use as instructed once daily. Diagnosis 250.0 .  Lancets MISC, 1 application by Does not apply route daily. .  Vitamin D, Ergocalciferol, (DRISDOL) 1.25 MG (50000 UNIT) CAPS capsule, Take 1 capsule (50,000 Units total) by mouth once a week.   Reviewed prior external information including notes and imaging from  primary care provider As well as notes that were available from care everywhere and other healthcare systems.  Past medical history, social, surgical and family history all reviewed in electronic medical record.  No pertanent information unless stated regarding to the chief complaint.   Review of Systems:  No headache, visual changes, nausea, vomiting, diarrhea, constipation, dizziness, abdominal pain, skin rash, fevers, chills, night sweats, weight loss, swollen lymph nodes, body aches, joint swelling, chest pain, shortness of breath, mood changes. POSITIVE muscle aches  Objective  Blood  pressure (!) 156/90, pulse 75, height 6' 2"  (1.88 m), weight 242 lb (109.8 kg), SpO2 97 %.   General: No apparent distress alert and oriented x3 mood and affect normal, dressed appropriately.  HEENT: Pupils equal, extraocular movements intact  Respiratory: Patient's speak in full sentences and does not appear short of breath  Cardiovascular: No lower extremity edema, non tender, no erythema  Neuro: Cranial nerves II through XII are intact, neurovascularly intact in all extremities with 2+ DTRs and 2+ pulses.  Gait antalgic Severe varus deformity of the knees bilaterally left greater than right.  Patient does have instability of the left knee noted.  Right knee does have a large medial mass noted.  Tender to palpation.  Fluctuant.  No erythema of the surrounding area  Limited musculoskeletal ultrasound was performed and interpreted by Lyndal Pulley  Limited ultrasound of patient's right knee shows a cystic mass noted over the medial malleolus.  Difficult to assess if there is actually communication.  No Baker's cyst noted.  No abnormal blood flow.  Does have some irritation of the surrounding soft tissues.   Procedure: Real-time Ultrasound Guided Injection and aspiration of right medial side ganglion cyst of knee Device: GE Logiq Q7 Ultrasound guided injection is preferred based studies that show increased duration, increased effect, greater accuracy, decreased procedural pain, increased response rate, and decreased cost with ultrasound guided versus blind injection.  Verbal informed consent obtained.  Time-out conducted.  Noted no overlying erythema, induration, or other signs of local infection.  Skin prepped in a sterile fashion.  Local anesthesia: Topical Ethyl chloride.  With sterile technique and under real time ultrasound guidance: With an 18-gauge 1-1/2 inch needle injected with 2 cc of 0.5% Marcaine and then aspirated significant amount of gel-like fluid.  No signs of any infectious  etiology.  Injected 1 cc of Kenalog 40 mg/mL Completed without difficulty  Pain immediately resolved suggesting accurate placement of the medication.  Advised to call if fevers/chills, erythema, induration, drainage, or persistent bleeding.  Images permanently stored and available for review in the ultrasound unit.  Impression: Technically successful ultrasound guided injection.   Impression and Recommendations:     This case required medical decision making of moderate complexity. The above documentation has been reviewed and is accurate and complete Lyndal Pulley, DO       Note: This dictation was prepared with Dragon dictation along with smaller phrase technology. Any transcriptional errors that result from this process are unintentional.

## 2020-02-08 NOTE — Patient Instructions (Signed)
Knee xray Compression sleeve See me again 6-8 weeks

## 2020-03-09 ENCOUNTER — Other Ambulatory Visit: Payer: Self-pay | Admitting: Internal Medicine

## 2020-03-09 NOTE — Telephone Encounter (Signed)
Please refill as per office routine med refill policy (all routine meds refilled for 3 mo or monthly per pt preference up to one year from last visit, then month to month grace period for 3 mo, then further med refills will have to be denied)  

## 2020-03-27 ENCOUNTER — Other Ambulatory Visit: Payer: Self-pay

## 2020-03-27 ENCOUNTER — Ambulatory Visit (INDEPENDENT_AMBULATORY_CARE_PROVIDER_SITE_OTHER): Payer: 59 | Admitting: Family Medicine

## 2020-03-27 ENCOUNTER — Encounter: Payer: Self-pay | Admitting: Family Medicine

## 2020-03-27 DIAGNOSIS — M6749 Ganglion, multiple sites: Secondary | ICD-10-CM

## 2020-03-27 DIAGNOSIS — M7139 Other bursal cyst, multiple sites: Secondary | ICD-10-CM

## 2020-03-27 DIAGNOSIS — M6789 Other specified disorders of synovium and tendon, multiple sites: Secondary | ICD-10-CM

## 2020-03-27 NOTE — Progress Notes (Signed)
[facility] [address] [place] Phone: [phone] Subjective:   I [name] am serving as a Education administrator for Dr. Hulan Saas.  This visit occurred during the SARS-CoV-2 public health emergency.  Safety protocols were in place, including screening questions prior to the visit, additional usage of staff PPE, and extensive cleaning of exam room while observing appropriate contact time as indicated for disinfecting solutions.   I'm seeing this patient by the request  of:  Biagio Borg, MD  CC: Right knee pain follow-up  UTM:[id number]   02/08/2020 Aspiration done today.  Tolerated the procedure well.  No significant underlying arthritic changes but even more bone-on-bone osteoarthritic changes of the contralateral knee.  Discussed compression, icing regimen, which activities to do which wants to avoid.  Patient is to increase activity slowly over the course of the next several weeks.  Follow-up again in 6 to 8 weeks to further evaluate.  X-rays ordered today to further evaluate to make sure no significant bony abnormality noted.  Update 03/27/2020 ANSAR SKODA is a 59 y.o. male coming in with complaint of right knee pain. Patient states he is feeling good today. Feels as if he is making progress.  Patient is 800% better.  No instability of the knee.  Has not noticed any increase in size of the cyst.  Feels like he is doing relatively well overall      Past Medical History:  Diagnosis Date  . Allergic rhinitis, cause unspecified 01/15/2012  . Allergy   . Cholelithiasis 01/20/2018  . Diabetes mellitus   . DIABETES MELLITUS, TYPE II 05/24/2007   Qualifier: Diagnosis of  By: Dance CMA ([place]), Kim    . HYPERLIPIDEMIA 10/06/2007   Qualifier: Diagnosis of  By: Alain Marion MD, Evie Lacks   . HYPERTENSION 02/06/2008   Qualifier: Diagnosis of  By: Alain Marion MD, Evie Lacks   . Lumbar disc disease    s/p surgury approx 1992  . PSA, INCREASED 05/11/2009    Qualifier: Diagnosis of  By: Ronnald Ramp MD, Arvid Right.   . Vitamin D deficiency 01/26/2020   Past Surgical History:  Procedure Laterality Date  . BACK SURGERY  1989   Lumbar/Ruptured Disk  . HERNIA REPAIR  may 2012   left inguinal  . KNEE ARTHROSCOPY Right 2014  . NASAL SEPTUM SURGERY     Social History   Socioeconomic History  . Marital status: Married    Spouse name: Not on file  . Number of children: Not on file  . Years of education: Not on file  . Highest education level: Not on file  Occupational History  . Not on file  Tobacco Use  . Smoking status: Never Smoker  . Smokeless tobacco: Never Used  Substance and Sexual Activity  . Alcohol use: Yes    Comment: very rare   . Drug use: No  . Sexual activity: Not on file  Other Topics Concern  . Not on file  Social History Narrative  . Not on file   Social Determinants of Health   Financial Resource Strain:   . Difficulty of Paying Living Expenses:   Food Insecurity:   . Worried About Charity fundraiser in the Last Year:   . Arboriculturist in the Last Year:   Transportation Needs:   . Film/video editor (Medical):   Marland Kitchen Lack of Transportation (Non-Medical):   Physical Activity:   . Days of Exercise per Week:   .  Minutes of Exercise per Session:   Stress:   . Feeling of Stress :   Social Connections:   . Frequency of Communication with Friends and Family:   . Frequency of Social Gatherings with Friends and Family:   . Attends Religious Services:   . Active Member of Clubs or Organizations:   . Attends Archivist Meetings:   Marland Kitchen Marital Status:    No Known Allergies Family History  Problem Relation Age of Onset  . Hypertension Mother   . Diabetes Mother   . Cancer Mother   . Hypertension Father   . Diabetes Father   . Colon cancer Neg Hx     Current Outpatient Medications (Endocrine & Metabolic):  .  metFORMIN (GLUCOPHAGE) 500 MG tablet, Take 2 tablets (1,000 mg total) by mouth 2 (two) times  daily with a meal.  Current Outpatient Medications (Cardiovascular):  .  lisinopril (ZESTRIL) 20 MG tablet, Take 1 tablet by mouth once daily .  rosuvastatin (CRESTOR) 40 MG tablet, Take 1 tablet by mouth once daily .  tadalafil (CIALIS) 20 MG tablet, Take 1 tablet (20 mg total) by mouth daily as needed for erectile dysfunction.  Current Outpatient Medications (Respiratory):  .  guaiFENesin-codeine (CHERATUSSIN AC) 100-10 MG/5ML syrup, Take 5 mLs by mouth 3 (three) times daily as needed for cough. .  triamcinolone (NASACORT) 55 MCG/ACT AERO nasal inhaler, Place 2 sprays into the nose daily.  Current Outpatient Medications (Analgesics):  .  aspirin 81 MG EC tablet, Take 1 tablet (81 mg total) by mouth daily. Swallow whole.   Current Outpatient Medications (Other):  .  Blood Glucose Monitoring Suppl (ONE TOUCH ULTRA 2) W/DEVICE KIT, Use as directed daily .  Diclofenac Sodium (PENNSAID) 2 % SOLN, Place 2 g onto the skin 2 (two) times daily. Marland Kitchen  glucose blood (ONE TOUCH ULTRA TEST) test strip, Use as instructed once daily. Diagnosis 250.0 .  Lancets MISC, 1 application by Does not apply route daily. .  Vitamin D, Ergocalciferol, (DRISDOL) 1.25 MG (50000 UNIT) CAPS capsule, Take 1 capsule (50,000 Units total) by mouth once a week.   Reviewed prior external information including notes and imaging from  primary care provider As well as notes that were available from care everywhere and other healthcare systems.  Past medical history, social, surgical and family history all reviewed in electronic medical record.  No pertanent information unless stated regarding to the chief complaint.   Review of Systems:  No headache, visual changes, nausea, vomiting, diarrhea, constipation, dizziness, abdominal pain, skin rash, fevers, chills, night sweats, weight loss, swollen lymph nodes, body aches, joint swelling, chest pain, shortness of breath, mood changes. POSITIVE muscle aches  Objective  Blood  pressure 130/90, pulse 70, height '6\' 2"'$  (1.88 m), weight 244 lb (110.7 kg), SpO2 97 %.   General: No apparent distress alert and oriented x3 mood and affect normal, dressed appropriately.  HEENT: Pupils equal, extraocular movements intact  Respiratory: Patient's speak in full sentences and does not appear short of breath  Cardiovascular: No lower extremity edema, non tender, no erythema  Neuro: Cranial nerves II through XII are intact, neurovascularly intact in all extremities with 2+ DTRs and 2+ pulses.  Gait normal with good balance and coordination.  MSK: Right knee exam shows the patient does have good stability still noted of the knee.  Mild crepitus noted.  Lateral tracking of the patella noted.  Good strength of the VMO no.  Where patient's previous cyst was seems to be  unremarkable at this time.    Impression and Recommendations:     The above documentation has been reviewed and is accurate and complete Lyndal Pulley, DO       Note: This dictation was prepared with Dragon dictation along with smaller phrase technology. Any transcriptional errors that result from this process are unintentional.

## 2020-03-27 NOTE — Assessment & Plan Note (Signed)
Patient doing significantly better at this time.  Does have a ganglion cyst on the inside but has gone down at the moment.  Patient is 100% better.  We will continue to monitor and patient can follow-up as needed.  Did discuss topical anti-inflammatories.

## 2020-03-27 NOTE — Patient Instructions (Addendum)
Good to see you We will monitor Cyst looks good Tart cherry extract 1200mg  at night Pennsaid when you need it See me again in 2-3 months or call when you need [place]

## 2020-05-23 ENCOUNTER — Encounter: Payer: Self-pay | Admitting: Family Medicine

## 2020-05-23 ENCOUNTER — Other Ambulatory Visit: Payer: Self-pay

## 2020-05-23 ENCOUNTER — Ambulatory Visit: Payer: 59 | Admitting: Family Medicine

## 2020-05-23 ENCOUNTER — Ambulatory Visit: Payer: Self-pay

## 2020-05-23 VITALS — BP 116/82 | HR 52 | Ht 74.0 in | Wt 245.0 lb

## 2020-05-23 DIAGNOSIS — G8929 Other chronic pain: Secondary | ICD-10-CM

## 2020-05-23 DIAGNOSIS — M6789 Other specified disorders of synovium and tendon, multiple sites: Secondary | ICD-10-CM

## 2020-05-23 DIAGNOSIS — M25562 Pain in left knee: Secondary | ICD-10-CM

## 2020-05-23 DIAGNOSIS — M6749 Ganglion, multiple sites: Secondary | ICD-10-CM

## 2020-05-23 DIAGNOSIS — M2550 Pain in unspecified joint: Secondary | ICD-10-CM | POA: Diagnosis not present

## 2020-05-23 DIAGNOSIS — M7139 Other bursal cyst, multiple sites: Secondary | ICD-10-CM

## 2020-05-23 NOTE — Patient Instructions (Signed)
Looks like simple ganglion cyst MRI always an option Can send you to a surgeon to remove cyst Can drain every 2-3 months Follow up in 2-3 months

## 2020-05-23 NOTE — Progress Notes (Signed)
[place] Phone: [phone] Subjective:   Howard Howard [name], am serving as a scribe for Dr. Hulan Howard [name]. This visit occurred during the SARS-CoV-2 public health emergency.  Safety protocols were in place, including screening questions prior to the visit, additional usage of staff PPE, and extensive cleaning of exam room while observing appropriate contact time as indicated for disinfecting solutions.   I'm seeing this patient by the request  of:  Howard Borg, MD  CC: Right knee pain  [id number]   03/27/2020 Patient doing significantly better at this time.  Does have a ganglion cyst on the inside but has gone down at the moment.  Patient is 100% better.  We will continue to monitor and patient can follow-up as needed.  Did discuss topical anti-inflammatories.  Update 05/23/2020 Howard Howard [name] is a 59 y.o. male coming in with complaint of right knee pain. Patient states that his pain is increasing since last visit.  Patient's right knee continues to have discomfort and pain with an increase in swelling.  Was found to have more of a ganglion cyst previously.    Past Medical History:  Diagnosis Date  . Allergic rhinitis, cause unspecified 01/15/2012  . Allergy   . Cholelithiasis 01/20/2018  . Diabetes mellitus   . DIABETES MELLITUS, TYPE II 05/24/2007   Qualifier: Diagnosis of  By: Dance CMA ([place]), Kim    . HYPERLIPIDEMIA 10/06/2007   Qualifier: Diagnosis of  By: Alain Marion MD, Evie Lacks   . HYPERTENSION 02/06/2008   Qualifier: Diagnosis of  By: Alain Marion MD, Evie Lacks   . Lumbar disc disease    s/p surgury approx 1992  . PSA, INCREASED 05/11/2009   Qualifier: Diagnosis of  By: Ronnald Ramp MD, Arvid Right.   . Vitamin D deficiency 01/26/2020   Past Surgical History:  Procedure Laterality Date  . BACK SURGERY  1989   Lumbar/Ruptured Disk  . HERNIA REPAIR  may 2012   left inguinal  . KNEE ARTHROSCOPY Right 2014  . NASAL  SEPTUM SURGERY     Social History   Socioeconomic History  . Marital status: Married    Spouse name: Not on file  . Number of children: Not on file  . Years of education: Not on file  . Highest education level: Not on file  Occupational History  . Not on file  Tobacco Use  . Smoking status: Never Smoker  . Smokeless tobacco: Never Used  Vaping Use  . Vaping Use: Never used  Substance and Sexual Activity  . Alcohol use: Yes    Comment: very rare   . Drug use: Howard [name]  . Sexual activity: Not on file  Other Topics Concern  . Not on file  Social History Narrative  . Not on file   Social Determinants of Health   Financial Resource Strain:   . Difficulty of Paying Living Expenses:   Food Insecurity:   . Worried About Charity fundraiser in the Last Year:   . Arboriculturist in the Last Year:   Transportation Needs:   . Film/video editor (Medical):   Marland Kitchen Lack of Transportation (Non-Medical):   Physical Activity:   . Days of Exercise per Week:   . Minutes of Exercise per Session:   Stress:   . Feeling of Stress :   Social Connections:   . Frequency of Communication with Friends and Family:   . Frequency of Social Gatherings with  Friends and Family:   . Attends Religious Services:   . Active Member of Clubs or Organizations:   . Attends Archivist Meetings:   Marland Kitchen Marital Status:    Howard [name] Known Allergies Family History  Problem Relation Age of Onset  . Hypertension Mother   . Diabetes Mother   . Cancer Mother   . Hypertension Father   . Diabetes Father   . Colon cancer Neg Hx     Current Outpatient Medications (Endocrine & Metabolic):  .  metFORMIN (GLUCOPHAGE) 500 MG tablet, Take 2 tablets (1,000 mg total) by mouth 2 (two) times daily with a meal.  Current Outpatient Medications (Cardiovascular):  .  lisinopril (ZESTRIL) 20 MG tablet, Take 1 tablet by mouth once daily .  rosuvastatin (CRESTOR) 40 MG tablet, Take 1 tablet by mouth once daily .  tadalafil  (CIALIS) 20 MG tablet, Take 1 tablet (20 mg total) by mouth daily as needed for erectile dysfunction.  Current Outpatient Medications (Respiratory):  .  guaiFENesin-codeine (CHERATUSSIN AC) 100-10 MG/5ML syrup, Take 5 mLs by mouth 3 (three) times daily as needed for cough. .  triamcinolone (NASACORT) 55 MCG/ACT AERO nasal inhaler, Place 2 sprays into the nose daily.  Current Outpatient Medications (Analgesics):  .  aspirin 81 MG EC tablet, Take 1 tablet (81 mg total) by mouth daily. Swallow whole.   Current Outpatient Medications (Other):  .  Blood Glucose Monitoring Suppl (ONE TOUCH ULTRA 2) W/DEVICE KIT, Use as directed daily .  Diclofenac Sodium (PENNSAID) 2 % SOLN, Place 2 g onto the skin 2 (two) times daily. Marland Kitchen  glucose blood (ONE TOUCH ULTRA TEST) test strip, Use as instructed once daily. Diagnosis 250.0 .  Lancets MISC, 1 application by Does not apply route daily. .  Vitamin D, Ergocalciferol, (DRISDOL) 1.25 MG (50000 UNIT) CAPS capsule, Take 1 capsule (50,000 Units total) by mouth once a week.   Reviewed prior external information including notes and imaging from  primary care provider As well as notes that were available from care everywhere and other healthcare systems.  Past medical history, social, surgical and family history all reviewed in electronic medical record.  Howard [name] pertanent information unless stated regarding to the chief complaint.   Review of Systems:  Howard [name] headache, visual changes, nausea, vomiting, diarrhea, constipation, dizziness, abdominal pain, skin rash, fevers, chills, night sweats, weight loss, swollen lymph nodes, body aches, joint swelling, chest pain, shortness of breath, mood changes. POSITIVE muscle aches  Objective  Blood pressure 116/82, pulse (!) 52, height 6' 2"  (1.88 m), weight 245 lb (111.1 kg), SpO2 99 %.   General: Howard [name] apparent distress alert and oriented x3 mood and affect normal, dressed appropriately.  HEENT: Pupils equal, extraocular  movements intact  Respiratory: Patient's speak in full sentences and does not appear short of breath  Cardiovascular: Howard [name] lower extremity edema, non tender, Howard [name] erythema  Neuro: Cranial nerves II through XII are intact, neurovascularly intact in all extremities with 2+ DTRs and 2+ pulses.  Gait antalgic MSK:   Knee: Right valgus deformity noted. Large thigh to calf ratio.  Significant swelling and cyst formation noted over the medial aspect of the knee again Tender to palpation over medial and PF joint line.  ROM full in flexion and extension and lower leg rotation. instability with valgus force.  painful patellar compression. Patellar glide with moderate crepitus. Patellar and quadriceps tendons unremarkable. Hamstring and quadriceps strength is normal. Contralateral knee shows mild arthritic changes but Howard [name] pain  Procedure: Real-time Ultrasound  Guided Injection of right knee Device: GE Logiq Q7 Ultrasound guided injection is preferred based studies that show increased duration, increased effect, greater accuracy, decreased procedural pain, increased response rate, and decreased cost with ultrasound guided versus blind injection.  Verbal informed consent obtained.  Time-out conducted.  Noted Howard [name] overlying erythema, induration, or other signs of local infection.  Skin prepped in a sterile fashion.  Local anesthesia: Topical Ethyl chloride.  With sterile technique and under real time ultrasound guidance: With a 22-gauge 2 inch needle patient was injected with 4 cc of 0.5% Marcaine and aspirated significant amount of gel-like fluid 1 cc of Kenalog 40 mg/dL. This was from a medial approach.  Completed without difficulty  Pain immediately resolved suggesting accurate placement of the medication.  Advised to call if fevers/chills, erythema, induration, drainage, or persistent bleeding.  Images permanently stored and available for review in the ultrasound unit.  Impression: Technically successful  ultrasound guided injection..     Impression and Recommendations:     The above documentation has been reviewed and is accurate and complete Lyndal Pulley, DO       Note: This dictation was prepared with Dragon dictation along with smaller phrase technology. Any transcriptional errors that result from this process are unintentional.

## 2020-05-23 NOTE — Assessment & Plan Note (Signed)
Patient given aspiration today.  Tolerated the procedure well, discussed icing regimen and home exercise.  Discussed avoiding certain activities.  We discussed with patient about this becoming recurrent and the possibility for surgical intervention.  Once again on ultrasound I do not see any point finding of any abnormal blood flow but we did discuss the possibility of an MRI to further evaluate.  Patient declined.  Patient would like to either consider the serial aspirations of the possibility of surgical intervention replacement is not necessary did discuss that with the amount of arthritic changes in the knee though I think we discussed this as well.  Follow-up with me again in 6 weeks

## 2020-05-24 LAB — SYNOVIAL CELL COUNT + DIFF, W/ CRYSTALS
Basophils, %: 0 %
Eosinophils-Synovial: 16 % — ABNORMAL HIGH (ref 0–2)
Lymphocytes-Synovial Fld: 67 % (ref 0–74)
Monocyte/Macrophage: 17 % (ref 0–69)
Neutrophil, Synovial: 0 % (ref 0–24)
Synoviocytes, %: 0 % (ref 0–15)
WBC, Synovial: 937 cells/uL — ABNORMAL HIGH (ref <150)

## 2020-06-27 ENCOUNTER — Ambulatory Visit: Payer: 59 | Admitting: Family Medicine

## 2020-06-27 NOTE — Progress Notes (Deleted)
Howard [name] Phone: [phone] Subjective:    I'm seeing this patient by the request  of:  Biagio Borg, MD  CC:   [id number]   05/23/2020 Patient given aspiration today.  Tolerated the procedure well, discussed icing regimen and home exercise.  Discussed avoiding certain activities.  We discussed with patient about this becoming recurrent and the possibility for surgical intervention.  Once again on ultrasound I do not see any point finding of any abnormal blood flow but we did discuss the possibility of an MRI to further evaluate.  Patient declined.  Patient would like to either consider the serial aspirations of the possibility of surgical intervention replacement is not necessary did discuss that with the amount of arthritic changes in the knee though I think we discussed this as well.  Follow-up with me again in 6 weeks  Update 06/27/2020 Howard [name] is a 59 y.o. male coming in with complaint of left knee pain.       Past Medical History:  Diagnosis Date  . Allergic rhinitis, cause unspecified 01/15/2012  . Allergy   . Cholelithiasis 01/20/2018  . Diabetes mellitus   . DIABETES MELLITUS, TYPE II 05/24/2007   Qualifier: Diagnosis of  By: Dance CMA ([place]), Kim    . HYPERLIPIDEMIA 10/06/2007   Qualifier: Diagnosis of  By: Alain Marion MD, Evie Lacks   . HYPERTENSION 02/06/2008   Qualifier: Diagnosis of  By: Alain Marion MD, Evie Lacks   . Lumbar disc disease    s/p surgury approx 1992  . PSA, INCREASED 05/11/2009   Qualifier: Diagnosis of  By: Ronnald Ramp MD, Arvid Right.   . Vitamin D deficiency 01/26/2020   Past Surgical History:  Procedure Laterality Date  . BACK SURGERY  1989   Lumbar/Ruptured Disk  . HERNIA REPAIR  may 2012   left inguinal  . KNEE ARTHROSCOPY Right 2014  . NASAL SEPTUM SURGERY     Social History   Socioeconomic History  . Marital status: Married    Spouse name: Not on file  . Number of  children: Not on file  . Years of education: Not on file  . Highest education level: Not on file  Occupational History  . Not on file  Tobacco Use  . Smoking status: Never Smoker  . Smokeless tobacco: Never Used  Vaping Use  . Vaping Use: Never used  Substance and Sexual Activity  . Alcohol use: Yes    Comment: very rare   . Drug use: No  . Sexual activity: Not on file  Other Topics Concern  . Not on file  Social History Narrative  . Not on file   Social Determinants of Health   Financial Resource Strain:   . Difficulty of Paying Living Expenses: Not on file  Food Insecurity:   . Worried About Charity fundraiser in the Last Year: Not on file  . Ran Out of Food in the Last Year: Not on file  Transportation Needs:   . Lack of Transportation (Medical): Not on file  . Lack of Transportation (Non-Medical): Not on file  Physical Activity:   . Days of Exercise per Week: Not on file  . Minutes of Exercise per Session: Not on file  Stress:   . Feeling of Stress : Not on file  Social Connections:   . Frequency of Communication with Friends and Family: Not on file  . Frequency of Social Gatherings with Friends and Family:  Not on file  . Attends Religious Services: Not on file  . Active Member of Clubs or Organizations: Not on file  . Attends Archivist Meetings: Not on file  . Marital Status: Not on file   No Known Allergies Family History  Problem Relation Age of Onset  . Hypertension Mother   . Diabetes Mother   . Cancer Mother   . Hypertension Father   . Diabetes Father   . Colon cancer Neg Hx     Current Outpatient Medications (Endocrine & Metabolic):  .  metFORMIN (GLUCOPHAGE) 500 MG tablet, Take 2 tablets (1,000 mg total) by mouth 2 (two) times daily with a meal.  Current Outpatient Medications (Cardiovascular):  .  lisinopril (ZESTRIL) 20 MG tablet, Take 1 tablet by mouth once daily .  rosuvastatin (CRESTOR) 40 MG tablet, Take 1 tablet by mouth once  daily .  tadalafil (CIALIS) 20 MG tablet, Take 1 tablet (20 mg total) by mouth daily as needed for erectile dysfunction.  Current Outpatient Medications (Respiratory):  .  guaiFENesin-codeine (CHERATUSSIN AC) 100-10 MG/5ML syrup, Take 5 mLs by mouth 3 (three) times daily as needed for cough. .  triamcinolone (NASACORT) 55 MCG/ACT AERO nasal inhaler, Place 2 sprays into the nose daily.  Current Outpatient Medications (Analgesics):  .  aspirin 81 MG EC tablet, Take 1 tablet (81 mg total) by mouth daily. Swallow whole.   Current Outpatient Medications (Other):  .  Blood Glucose Monitoring Suppl (ONE TOUCH ULTRA 2) W/DEVICE KIT, Use as directed daily .  Diclofenac Sodium (PENNSAID) 2 % SOLN, Place 2 g onto the skin 2 (two) times daily. Marland Kitchen  glucose blood (ONE TOUCH ULTRA TEST) test strip, Use as instructed once daily. Diagnosis 250.0 .  Lancets MISC, 1 application by Does not apply route daily. .  Vitamin D, Ergocalciferol, (DRISDOL) 1.25 MG (50000 UNIT) CAPS capsule, Take 1 capsule (50,000 Units total) by mouth once a week.   Reviewed prior external information including notes and imaging from  primary care provider As well as notes that were available from care everywhere and other healthcare systems.  Past medical history, social, surgical and family history all reviewed in electronic medical record.  No pertanent information unless stated regarding to the chief complaint.   Review of Systems:  No headache, visual changes, nausea, vomiting, diarrhea, constipation, dizziness, abdominal pain, skin rash, fevers, chills, night sweats, weight loss, swollen lymph nodes, body aches, joint swelling, chest pain, shortness of breath, mood changes. POSITIVE muscle aches  Objective  There were no vitals taken for this visit.   General: No apparent distress alert and oriented x3 mood and affect normal, dressed appropriately.  HEENT: Pupils equal, extraocular movements intact  Respiratory: Patient's  speak in full sentences and does not appear short of breath  Cardiovascular: No lower extremity edema, non tender, no erythema  Neuro: Cranial nerves II through XII are intact, neurovascularly intact in all extremities with 2+ DTRs and 2+ pulses.  Gait normal with good balance and coordination.  MSK:  Non tender with full range of motion and good stability and symmetric strength and tone of shoulders, elbows, wrist, hip, knee and ankles bilaterally.     Impression and Recommendations:     The above documentation has been reviewed and is accurate and complete Jacqualin Combes       Note: This dictation was prepared with Dragon dictation along with smaller phrase technology. Any transcriptional errors that result from this process are unintentional.

## 2020-07-10 ENCOUNTER — Other Ambulatory Visit: Payer: Self-pay | Admitting: Internal Medicine

## 2020-07-25 ENCOUNTER — Other Ambulatory Visit (INDEPENDENT_AMBULATORY_CARE_PROVIDER_SITE_OTHER): Payer: 59

## 2020-07-25 DIAGNOSIS — E119 Type 2 diabetes mellitus without complications: Secondary | ICD-10-CM

## 2020-07-25 LAB — LIPID PANEL
Cholesterol: 109 mg/dL (ref 0–200)
HDL: 43.5 mg/dL (ref >39.00)
LDL Cholesterol: 55 mg/dL (ref 0–99)
NonHDL: 65.89
Total CHOL/HDL Ratio: 3
Triglycerides: 52 mg/dL (ref 0.0–149.0)
VLDL: 10.4 mg/dL (ref 0.0–40.0)

## 2020-07-25 LAB — HEPATIC FUNCTION PANEL
ALT: 23 U/L (ref 0–53)
AST: 22 U/L (ref 0–37)
Albumin: 4.1 g/dL (ref 3.5–5.2)
Alkaline Phosphatase: 69 U/L (ref 39–117)
Bilirubin, Direct: 0.1 mg/dL (ref 0.0–0.3)
Total Bilirubin: 0.4 mg/dL (ref 0.2–1.2)
Total Protein: 6.7 g/dL (ref 6.0–8.3)

## 2020-07-25 LAB — BASIC METABOLIC PANEL
BUN: 14 mg/dL (ref 6–23)
CO2: 27 mEq/L (ref 19–32)
Calcium: 9 mg/dL (ref 8.4–10.5)
Chloride: 106 mEq/L (ref 96–112)
Creatinine, Ser: 0.99 mg/dL (ref 0.40–1.50)
GFR: 83.4 mL/min (ref >60.00)
Glucose, Bld: 177 mg/dL — ABNORMAL HIGH (ref 70–99)
Potassium: 4 mEq/L (ref 3.5–5.1)
Sodium: 140 mEq/L (ref 135–145)

## 2020-07-25 LAB — HEMOGLOBIN A1C: Hgb A1c MFr Bld: 8.6 % — ABNORMAL HIGH (ref 4.6–6.5)

## 2020-07-27 ENCOUNTER — Encounter: Payer: Self-pay | Admitting: Internal Medicine

## 2020-07-27 ENCOUNTER — Ambulatory Visit: Payer: 59 | Admitting: Internal Medicine

## 2020-07-27 ENCOUNTER — Other Ambulatory Visit: Payer: Self-pay

## 2020-07-27 VITALS — BP 162/92 | HR 70 | Temp 98.3°F | Ht 74.0 in | Wt 247.0 lb

## 2020-07-27 DIAGNOSIS — E559 Vitamin D deficiency, unspecified: Secondary | ICD-10-CM | POA: Diagnosis not present

## 2020-07-27 DIAGNOSIS — Z0000 Encounter for general adult medical examination without abnormal findings: Secondary | ICD-10-CM

## 2020-07-27 DIAGNOSIS — R361 Hematospermia: Secondary | ICD-10-CM

## 2020-07-27 DIAGNOSIS — E785 Hyperlipidemia, unspecified: Secondary | ICD-10-CM

## 2020-07-27 DIAGNOSIS — E1165 Type 2 diabetes mellitus with hyperglycemia: Secondary | ICD-10-CM | POA: Diagnosis not present

## 2020-07-27 DIAGNOSIS — I10 Essential (primary) hypertension: Secondary | ICD-10-CM | POA: Diagnosis not present

## 2020-07-27 NOTE — Progress Notes (Signed)
Subjective:    Patient ID: Howard [name], male    DOB: 1960/11/12, 59 y.o.   MRN: [id number]  HPI  Here to f/u; overall doing ok,  Pt denies chest pain, increasing sob or doe, wheezing, orthopnea, PND, increased LE swelling, palpitations, dizziness or syncope.  Pt denies new neurological symptoms such as new headache, or facial or extremity weakness or numbness.  Pt denies polydipsia, polyuria, or low sugar episode.  Pt states overall good compliance with meds, mostly trying to follow appropriate diet, with wt overall stable,  but little exercise however. BP at home < 140.90  States however diet has been much worse recently since changed position to first shift.  Also menitons hematospermia recently Denies urinary symptoms such as dysuria, frequency, urgency, flank pain, hematuria or n/v, fever, chills.  BP Readings from Last 3 Encounters:  07/27/20 (!) 162/92  05/23/20 116/82  03/27/20 130/90   Past Medical History:  Diagnosis Date  . Allergic rhinitis, cause unspecified 01/15/2012  . Allergy   . Cholelithiasis 01/20/2018  . Diabetes mellitus   . DIABETES MELLITUS, TYPE II 05/24/2007   Qualifier: Diagnosis of  By: Dance CMA ([place]), Kim    . HYPERLIPIDEMIA 10/06/2007   Qualifier: Diagnosis of  By: Alain Marion MD, Evie Lacks   . HYPERTENSION 02/06/2008   Qualifier: Diagnosis of  By: Alain Marion MD, Evie Lacks   . Lumbar disc disease    s/p surgury approx 1992  . PSA, INCREASED 05/11/2009   Qualifier: Diagnosis of  By: Ronnald Ramp MD, Arvid Right.   . Vitamin D deficiency 01/26/2020   Past Surgical History:  Procedure Laterality Date  . BACK SURGERY  1989   Lumbar/Ruptured Disk  . HERNIA REPAIR  may 2012   left inguinal  . KNEE ARTHROSCOPY Right 2014  . NASAL SEPTUM SURGERY      reports that he has never smoked. He has never used smokeless tobacco. He reports current alcohol use. He reports that he does not use drugs. family history includes Cancer in his mother; Diabetes in his father and mother;  Hypertension in his father and mother. No Known Allergies Current Outpatient Medications on File Prior to Visit  Medication Sig Dispense Refill  . aspirin 81 MG EC tablet Take 1 tablet (81 mg total) by mouth daily. Swallow whole. 30 tablet 12  . Blood Glucose Monitoring Suppl (ONE TOUCH ULTRA 2) W/DEVICE KIT Use as directed daily 1 each 0  . Diclofenac Sodium (PENNSAID) 2 % SOLN Place 2 g onto the skin 2 (two) times daily. 112 g 3  . glucose blood (ONE TOUCH ULTRA TEST) test strip Use as instructed once daily. Diagnosis 250.0 100 each 12  . guaiFENesin-codeine (CHERATUSSIN AC) 100-10 MG/5ML syrup Take 5 mLs by mouth 3 (three) times daily as needed for cough. 180 mL 0  . Lancets MISC 1 application by Does not apply route daily. 100 each 12  . lisinopril (ZESTRIL) 20 MG tablet Take 1 tablet by mouth once daily 90 tablet 0  . metFORMIN (GLUCOPHAGE) 500 MG tablet TAKE 2 TABLETS BY MOUTH TWICE DAILY WITH A MEAL 360 tablet 0  . rosuvastatin (CRESTOR) 40 MG tablet Take 1 tablet by mouth once daily 90 tablet 0  . tadalafil (CIALIS) 20 MG tablet Take 1 tablet (20 mg total) by mouth daily as needed for erectile dysfunction. 10 tablet 11  . triamcinolone (NASACORT) 55 MCG/ACT AERO nasal inhaler Place 2 sprays into the nose daily. 1 Inhaler 12   No current facility-administered  medications on file prior to visit.   Review of Systems All otherwise neg per pt    Objective:   Physical Exam BP (!) 162/92 (BP Location: Left Arm, Patient Position: Sitting, Cuff Size: Large)   Pulse 70   Temp 98.3 F (36.8 C) (Oral)   Ht 6' 2"  (1.88 m)   Wt 247 lb (112 kg)   SpO2 97%   BMI 31.71 kg/m  VS noted,  Constitutional: Pt appears in NAD HENT: Head: NCAT.  Right Ear: External ear normal.  Left Ear: External ear normal.  Eyes: . Pupils are equal, round, and reactive to light. Conjunctivae and EOM are normal Nose: without d/c or deformity Neck: Neck supple. Gross normal ROM Cardiovascular: Normal rate and  regular rhythm.   Pulmonary/Chest: Effort normal and breath sounds without rales or wheezing.  Abd:  Soft, NT, ND, + BS, no organomegaly Neurological: Pt is alert. At baseline orientation, motor grossly intact Skin: Skin is warm. No rashes, other new lesions, no LE edema Psychiatric: Pt behavior is normal without agitation  All otherwise neg per pt Lab Results  Component Value Date   WBC 4.0 01/24/2020   HGB 13.5 01/24/2020   HCT 40.8 01/24/2020   PLT 189.0 01/24/2020   GLUCOSE 177 (H) 07/25/2020   CHOL 109 07/25/2020   TRIG 52.0 07/25/2020   HDL 43.50 07/25/2020   LDLDIRECT 127.5 04/02/2007   LDLCALC 55 07/25/2020   ALT 23 07/25/2020   AST 22 07/25/2020   NA 140 07/25/2020   K 4.0 07/25/2020   CL 106 07/25/2020   CREATININE 0.99 07/25/2020   BUN 14 07/25/2020   CO2 27 07/25/2020   TSH 2.86 01/24/2020   PSA 2.01 01/24/2020   HGBA1C 8.6 (H) 07/25/2020   MICROALBUR 1.7 01/24/2020      Assessment & Plan:

## 2020-07-27 NOTE — Patient Instructions (Addendum)
Please continue all other medications as before, and refills have been done if requested.  Please have the pharmacy call with any other refills you may need.  Please continue your efforts at being more active, low cholesterol diet, and weight control.  Please keep your appointments with your specialists as you may have planned  We have discussed the Cardiac CT Score test to measure the calcification level (if any) in your heart arteries.  This test has been ordered in our [facility], so please call [facility] directly, as they prefer this, at [phone] to be scheduled.  Please make an Appointment to return in 6 months, or sooner if needed, also with Lab Appointment for testing done 3-5 days before at the [facility] (so this is for TWO appointments - please see the scheduling desk as you leave)

## 2020-07-28 ENCOUNTER — Encounter: Payer: Self-pay | Admitting: Internal Medicine

## 2020-07-28 NOTE — Assessment & Plan Note (Addendum)
Uncontrolled, pt states will double down on diet, declines med changes,  to f/u any worsening symptoms or concerns  I spent 31 minutes in preparing to see the patient by review of recent labs, imaging and procedures, obtaining and reviewing separately obtained history, communicating with the patient and family or caregiver, ordering medications, tests or procedures, and documenting clinical information in the EHR including the differential Dx, treatment, and any further evaluation and other management of dm, htn, hematospermia, hld, vit d def

## 2020-07-28 NOTE — Assessment & Plan Note (Signed)
Continue oral replacement.  

## 2020-07-28 NOTE — Addendum Note (Signed)
Addended by: Biagio Borg on: 07/28/2020 07:08 PM   Modules accepted: Orders

## 2020-07-28 NOTE — Assessment & Plan Note (Signed)
Benign, d/w pt,  to f/u any worsening symptoms or concerns

## 2020-07-28 NOTE — Assessment & Plan Note (Signed)
stable overall by history and exam, recent data reviewed with pt, and pt to continue medical treatment as before,  to f/u any worsening symptoms or concerns  

## 2020-08-07 ENCOUNTER — Ambulatory Visit: Payer: 59 | Admitting: Family Medicine

## 2020-08-07 ENCOUNTER — Ambulatory Visit: Payer: Self-pay

## 2020-08-07 ENCOUNTER — Other Ambulatory Visit: Payer: Self-pay

## 2020-08-07 ENCOUNTER — Encounter: Payer: Self-pay | Admitting: Family Medicine

## 2020-08-07 VITALS — BP 132/92 | HR 88 | Ht 74.0 in | Wt 247.0 lb

## 2020-08-07 DIAGNOSIS — M6749 Ganglion, multiple sites: Secondary | ICD-10-CM | POA: Diagnosis not present

## 2020-08-07 DIAGNOSIS — M7139 Other bursal cyst, multiple sites: Secondary | ICD-10-CM

## 2020-08-07 DIAGNOSIS — M6789 Other specified disorders of synovium and tendon, multiple sites: Secondary | ICD-10-CM

## 2020-08-07 NOTE — Progress Notes (Signed)
[place] [facility] [name] Phone: [phone] Subjective:   Howard Howard [name], am serving as a scribe for Dr. Hulan [name]. This visit occurred during the SARS-CoV-2 public health emergency.  Safety protocols were in place, including screening questions prior to the visit, additional usage of staff PPE, and extensive cleaning of exam room while observing appropriate contact time as indicated for disinfecting solutions.   I'm seeing this patient by the request  of:  Howard Borg, MD  CC: Right knee pain follow-up  Howard Howard [name]   05/23/2020 Patient given aspiration today.  Tolerated the procedure well, discussed icing regimen and home exercise.  Discussed avoiding certain activities.  We discussed with patient about this becoming recurrent and the possibility for surgical intervention.  Once again on ultrasound I do not see any point finding of any abnormal blood flow but we did discuss the possibility of an MRI to further evaluate.  Patient declined.  Patient would like to either consider the serial aspirations of the possibility of surgical intervention replacement is not necessary did discuss that with the amount of arthritic changes in the knee though I think we discussed this as well.  Follow-up with me again in 6 weeks  Update 08/07/2020 Howard Howard [name] is a 59 y.o. male coming in with complaint of right knee pain. Patient states that  Continuing to have the swelling again.  Patient has had it drained 2 times at this time.  Patient states that he would like something a little more concrete.  Would like this not to be coming back and what else we need to be done.      Past Medical History:  Diagnosis Date  . Allergic rhinitis, cause unspecified 01/15/2012  . Allergy   . Cholelithiasis 01/20/2018  . Diabetes mellitus   . DIABETES MELLITUS, TYPE II 05/24/2007   Qualifier: Diagnosis of  By: Dance CMA ([place]), Kim    . HYPERLIPIDEMIA  10/06/2007   Qualifier: Diagnosis of  By: Alain Marion MD, Evie Lacks   . HYPERTENSION 02/06/2008   Qualifier: Diagnosis of  By: Alain Marion MD, Evie Lacks   . Lumbar disc disease    s/p surgury approx 1992  . PSA, INCREASED 05/11/2009   Qualifier: Diagnosis of  By: Ronnald Ramp MD, Arvid Right.   . Vitamin D deficiency 01/26/2020   Past Surgical History:  Procedure Laterality Date  . BACK SURGERY  1989   Lumbar/Ruptured Disk  . HERNIA REPAIR  may 2012   left inguinal  . KNEE ARTHROSCOPY Right 2014  . NASAL SEPTUM SURGERY     Social History   Socioeconomic History  . Marital status: Married    Spouse name: Not on file  . Number of children: Not on file  . Years of education: Not on file  . Highest education level: Not on file  Occupational History  . Not on file  Tobacco Use  . Smoking status: Never Smoker  . Smokeless tobacco: Never Used  Vaping Use  . Vaping Use: Never used  Substance and Sexual Activity  . Alcohol use: Yes    Comment: very rare   . Drug use: Howard [name]  . Sexual activity: Not on file  Other Topics Concern  . Not on file  Social History Narrative  . Not on file   Social Determinants of Health   Financial Resource Strain:   . Difficulty of Paying Living Expenses: Not on file  Food Insecurity:   . Worried About  Running Out of Food in the Last Year: Not on file  . Ran Out of Food in the Last Year: Not on file  Transportation Needs:   . Lack of Transportation (Medical): Not on file  . Lack of Transportation (Non-Medical): Not on file  Physical Activity:   . Days of Exercise per Week: Not on file  . Minutes of Exercise per Session: Not on file  Stress:   . Feeling of Stress : Not on file  Social Connections:   . Frequency of Communication with Friends and Family: Not on file  . Frequency of Social Gatherings with Friends and Family: Not on file  . Attends Religious Services: Not on file  . Active Member of Clubs or Organizations: Not on file  . Attends Theatre manager Meetings: Not on file  . Marital Status: Not on file   Howard [name] Known Allergies Family History  Problem Relation Age of Onset  . Hypertension Mother   . Diabetes Mother   . Cancer Mother   . Hypertension Father   . Diabetes Father   . Colon cancer Neg Hx     Current Outpatient Medications (Endocrine & Metabolic):  .  metFORMIN (GLUCOPHAGE) 500 MG tablet, TAKE 2 TABLETS BY MOUTH TWICE DAILY WITH A MEAL  Current Outpatient Medications (Cardiovascular):  .  lisinopril (ZESTRIL) 20 MG tablet, Take 1 tablet by mouth once daily .  rosuvastatin (CRESTOR) 40 MG tablet, Take 1 tablet by mouth once daily .  tadalafil (CIALIS) 20 MG tablet, Take 1 tablet (20 mg total) by mouth daily as needed for erectile dysfunction.  Current Outpatient Medications (Respiratory):  .  guaiFENesin-codeine (CHERATUSSIN AC) 100-10 MG/5ML syrup, Take 5 mLs by mouth 3 (three) times daily as needed for cough. .  triamcinolone (NASACORT) 55 MCG/ACT AERO nasal inhaler, Place 2 sprays into the nose daily.  Current Outpatient Medications (Analgesics):  .  aspirin 81 MG EC tablet, Take 1 tablet (81 mg total) by mouth daily. Swallow whole.   Current Outpatient Medications (Other):  .  Blood Glucose Monitoring Suppl (ONE TOUCH ULTRA 2) W/DEVICE KIT, Use as directed daily .  Diclofenac Sodium (PENNSAID) 2 % SOLN, Place 2 g onto the skin 2 (two) times daily. Marland Kitchen  glucose blood (ONE TOUCH ULTRA TEST) test strip, Use as instructed once daily. Diagnosis 250.0 .  Lancets MISC, 1 application by Does not apply route daily.   Reviewed prior external information including notes and imaging from  primary care provider As well as notes that were available from care everywhere and other healthcare systems.  Past medical history, social, surgical and family history all reviewed in electronic medical record.  Howard [name] pertanent information unless stated regarding to the chief complaint.   Review of Systems:  Howard [name] headache, visual  changes, nausea, vomiting, diarrhea, constipation, dizziness, abdominal pain, skin rash, fevers, chills, night sweats, weight loss, swollen lymph nodes, body aches, joint swelling, chest pain, shortness of breath, mood changes. POSITIVE muscle aches  Objective  Blood pressure (!) 132/92, pulse 88, height 6' 2"  (1.88 m), weight 247 lb (112 kg), SpO2 98 %.   General: Howard [name] apparent distress alert and oriented x3 mood and affect normal, dressed appropriately.  HEENT: Pupils equal, extraocular movements intact  Respiratory: Patient's speak in full sentences and does not appear short of breath  Cardiovascular: Howard [name] lower extremity edema, non tender, Howard [name] erythema  Gait mild antalgic MSK: Right knee still shows some arthritic changes.  Positive grinding noted.  Patient does still have  a large mass noted on the medial aspect of the knee.  Tender to palpation.  Howard [name] erythema.  Limited musculoskeletal ultrasound was performed and interpreted by  Lyndal Pulley  Limited musculoskeletal ultrasound shows that patient does still have the fluid noted.  Difficult to tell though if it does go intra-articular.  Howard [name] significant difference otherwise.  Howard [name] abnormal vascularity noted.    Impression and Recommendations:     The above documentation has been reviewed and is accurate and complete Lyndal Pulley, DO

## 2020-08-07 NOTE — Patient Instructions (Signed)
[facility] [phone] [facility]: Dr. Mayer Camel or Dr. Rhona Raider

## 2020-08-07 NOTE — Assessment & Plan Note (Signed)
Patient has had already reaccumulation in the last 10 weeks of this ganglion cyst.  I would like to see how far this goes and I do think an MRI is more beneficial.  Patient will have this ordered.  Will be also referred to orthopedic surgery at depending on the size of the ganglion cyst I do think possible surgical intervention could be beneficial.  Follow-up again would be after imaging if patient wants to continue with conservative therapy.

## 2020-08-17 ENCOUNTER — Encounter: Payer: Self-pay | Admitting: Internal Medicine

## 2020-08-26 ENCOUNTER — Other Ambulatory Visit: Payer: Self-pay

## 2020-08-26 ENCOUNTER — Ambulatory Visit
Admission: RE | Admit: 2020-08-26 | Discharge: 2020-08-26 | Disposition: A | Discharge status: Home / Self Care | Payer: 59 | Source: Ambulatory Visit | Attending: Family Medicine | Admitting: Family Medicine

## 2020-08-26 DIAGNOSIS — M7139 Other bursal cyst, multiple sites: Secondary | ICD-10-CM

## 2020-08-26 DIAGNOSIS — M6749 Ganglion, multiple sites: Secondary | ICD-10-CM

## 2020-09-04 ENCOUNTER — Encounter: Payer: Self-pay | Admitting: Family Medicine

## 2020-09-04 ENCOUNTER — Other Ambulatory Visit: Payer: Self-pay

## 2020-09-04 ENCOUNTER — Ambulatory Visit: Payer: 59 | Admitting: Family Medicine

## 2020-09-04 ENCOUNTER — Ambulatory Visit: Payer: Self-pay

## 2020-09-04 VITALS — BP 116/92 | HR 87 | Ht 74.0 in | Wt 251.0 lb

## 2020-09-04 DIAGNOSIS — M7139 Other bursal cyst, multiple sites: Secondary | ICD-10-CM

## 2020-09-04 DIAGNOSIS — M6789 Other specified disorders of synovium and tendon, multiple sites: Secondary | ICD-10-CM

## 2020-09-04 DIAGNOSIS — M1711 Unilateral primary osteoarthritis, right knee: Secondary | ICD-10-CM | POA: Insufficient documentation

## 2020-09-04 DIAGNOSIS — M6749 Ganglion, multiple sites: Secondary | ICD-10-CM | POA: Diagnosis not present

## 2020-09-04 DIAGNOSIS — G8929 Other chronic pain: Secondary | ICD-10-CM

## 2020-09-04 DIAGNOSIS — M25561 Pain in right knee: Secondary | ICD-10-CM

## 2020-09-04 NOTE — Progress Notes (Signed)
[place] Phone: [phone] Subjective:   Howard [name], am serving as a scribe for Dr. Hulan Saas. This visit occurred during the SARS-CoV-2 public health emergency.  Safety protocols were in place, including screening questions prior to the visit, additional usage of staff PPE, and extensive cleaning of exam room while observing appropriate contact time as indicated for disinfecting solutions.   I'm seeing this patient by the request  of:  Biagio Borg, MD  CC: Knee pain follow-up  MEQ:[id number]   08/07/2020 Patient has had already reaccumulation in the last 10 weeks of this ganglion cyst.  I would like to see how far this goes and I do think an MRI is more beneficial.  Patient will have this ordered.  Will be also referred to orthopedic surgery at depending on the size of the ganglion cyst I do think possible surgical intervention could be beneficial.  Follow-up again would be after imaging if patient wants to continue with conservative therapy.    Update 09/04/2020 Howard [name] is a 59 y.o. male coming in with complaint of right knee pain. Patient states that he saw Dr. Mayer Camel. Patient does have pain at night. He is unable to sleep due to pain on medial aspect of right knee.  Patient would like the aspiration drain.  Does not want a replacement at this time.   MRI IMPRESSION: 1. Tricompartmental osteoarthritis with full-thickness cartilage loss involving the central weight-bearing medial femoral condyle with suspected chondral delamination component. 2. Well-circumscribed cystic structure at the medial joint line superficial to the MCL measuring 3.5 x 1.7 x 3.3 cm, favored to represent a ganglion cyst. Additional small multilobulated cystic structures extending from the posterior joint line to the dominant cyst are also favored to represent small ganglia. 3. Irregular oblique tear extending to the  inferior articular surface of the lateral meniscal body.    Past Medical History:  Diagnosis Date  . Allergic rhinitis, cause unspecified 01/15/2012  . Allergy   . Cholelithiasis 01/20/2018  . Diabetes mellitus   . DIABETES MELLITUS, TYPE II 05/24/2007   Qualifier: Diagnosis of  By: Dance CMA ([place]), Kim    . HYPERLIPIDEMIA 10/06/2007   Qualifier: Diagnosis of  By: Alain Marion MD, Evie Lacks   . HYPERTENSION 02/06/2008   Qualifier: Diagnosis of  By: Alain Marion MD, Evie Lacks   . Lumbar disc disease    s/p surgury approx 1992  . PSA, INCREASED 05/11/2009   Qualifier: Diagnosis of  By: Ronnald Ramp MD, Arvid Right.   . Vitamin D deficiency 01/26/2020   Past Surgical History:  Procedure Laterality Date  . BACK SURGERY  1989   Lumbar/Ruptured Disk  . HERNIA REPAIR  may 2012   left inguinal  . KNEE ARTHROSCOPY Right 2014  . NASAL SEPTUM SURGERY     Social History   Socioeconomic History  . Marital status: Married    Spouse name: Not on file  . Number of children: Not on file  . Years of education: Not on file  . Highest education level: Not on file  Occupational History  . Not on file  Tobacco Use  . Smoking status: Never Smoker  . Smokeless tobacco: Never Used  Vaping Use  . Vaping Use: Never used  Substance and Sexual Activity  . Alcohol use: Yes    Comment: very rare   . Drug use: [name]  . Sexual activity: Not on file  Other Topics Concern  .  Not on file  Social History Narrative  . Not on file   Social Determinants of Health   Financial Resource Strain:   . Difficulty of Paying Living Expenses: Not on file  Food Insecurity:   . Worried About Charity fundraiser in the Last Year: Not on file  . Ran Out of Food in the Last Year: Not on file  Transportation Needs:   . Lack of Transportation (Medical): Not on file  . Lack of Transportation (Non-Medical): Not on file  Physical Activity:   . Days of Exercise per Week: Not on file  . Minutes of Exercise per Session: Not on file    Stress:   . Feeling of Stress : Not on file  Social Connections:   . Frequency of Communication with Friends and Family: Not on file  . Frequency of Social Gatherings with Friends and Family: Not on file  . Attends Religious Services: Not on file  . Active Member of Clubs or Organizations: Not on file  . Attends Archivist Meetings: Not on file  . Marital Status: Not on file   [name] Known Allergies Family History  Problem Relation Age of Onset  . Hypertension Mother   . Diabetes Mother   . Cancer Mother   . Hypertension Father   . Diabetes Father   . Colon cancer Neg Hx     Current Outpatient Medications (Endocrine & Metabolic):  .  metFORMIN (GLUCOPHAGE) 500 MG tablet, TAKE 2 TABLETS BY MOUTH TWICE DAILY WITH A MEAL  Current Outpatient Medications (Cardiovascular):  .  lisinopril (ZESTRIL) 20 MG tablet, Take 1 tablet by mouth once daily .  rosuvastatin (CRESTOR) 40 MG tablet, Take 1 tablet by mouth once daily .  tadalafil (CIALIS) 20 MG tablet, Take 1 tablet (20 mg total) by mouth daily as needed for erectile dysfunction.  Current Outpatient Medications (Respiratory):  .  guaiFENesin-codeine (CHERATUSSIN AC) 100-10 MG/5ML syrup, Take 5 mLs by mouth 3 (three) times daily as needed for cough. .  triamcinolone (NASACORT) 55 MCG/ACT AERO nasal inhaler, Place 2 sprays into the nose daily.  Current Outpatient Medications (Analgesics):  .  aspirin 81 MG EC tablet, Take 1 tablet (81 mg total) by mouth daily. Swallow whole.   Current Outpatient Medications (Other):  .  Blood Glucose Monitoring Suppl (ONE TOUCH ULTRA 2) W/DEVICE KIT, Use as directed daily .  Diclofenac Sodium (PENNSAID) 2 % SOLN, Place 2 g onto the skin 2 (two) times daily. Marland Kitchen  glucose blood (ONE TOUCH ULTRA TEST) test strip, Use as instructed once daily. Diagnosis 250.0 .  Lancets MISC, 1 application by Does not apply route daily.   Reviewed prior external information including notes and imaging from   primary care provider As well as notes that were available from care everywhere and other healthcare systems.  Past medical history, social, surgical and family history all reviewed in electronic medical record.  [name] pertanent information unless stated regarding to the chief complaint.   Review of Systems:  [name] headache, visual changes, nausea, vomiting, diarrhea, constipation, dizziness, abdominal pain, skin rash, fevers, chills, night sweats, weight loss, swollen lymph nodes, body aches, joint swelling, chest pain, shortness of breath, mood changes. POSITIVE muscle aches  Objective  Blood pressure (!) 116/92, pulse 87, height 6' 2" (1.88 m), weight 251 lb (113.9 kg), SpO2 99 %.   General: [name] apparent distress alert and oriented x3 mood and affect normal, dressed appropriately.  HEENT: Pupils equal, extraocular movements intact  Respiratory:  Patient's speak in full sentences and does not appear short of breath  Cardiovascular: [name] lower extremity edema, non tender, [name] erythema  Severely antalgic. Patient's knee does have abnormal swelling noted on the medial joint line.  Cyst noted.  Tender to palpation, limited range of motion and does have some instability of the knee with valgus and varus force.  Procedure: Real-time Ultrasound Guided Injection of right knee ganglion cyst Device: GE Logiq Q7 Ultrasound guided injection is preferred based studies that show increased duration, increased effect, greater accuracy, decreased procedural pain, increased response rate, and decreased cost with ultrasound guided versus blind injection.  Verbal informed consent obtained.  Time-out conducted.  Noted [name] overlying erythema, induration, or other signs of local infection.  Skin prepped in a sterile fashion.  Local anesthesia: Topical Ethyl chloride.  With sterile technique and under real time ultrasound guidance: With a 22-gauge 2 inch needle patient was injected with 4 cc of 0.5% Marcaine then  aspirated significant amount of gel-like fluid then injected  1 cc of Kenalog 40 mg/dL. This was from medial approach Completed without difficulty  Pain immediately resolved suggesting accurate placement of the medication.  Advised to call if fevers/chills, erythema, induration, drainage, or persistent bleeding.  Impression: Technically successful ultrasound guided injection.    Impression and Recommendations:     The above documentation has been reviewed and is accurate and complete Howard M Smith, DO   

## 2020-09-04 NOTE — Assessment & Plan Note (Addendum)
Noted on MRI.  Discussed posture and ergonomics, discussed bracing.  Follow-up with me as needed.  Patient wants to hold on surgical intervention as long as possible patient had aspiration again today.  If we do try a serial aspiration I would like to either poked twice this next time to try to get better resolution of the ganglion cyst.  Patient is in agreement with the plan and will follow up again in 10 to 12 weeks.  Chronic problem with exacerbation

## 2020-09-04 NOTE — Assessment & Plan Note (Signed)
Aspiration done again today.  Tolerated procedure well.  Discussed icing regimen and home exercises.  Discussed with patient with the underlying arthritic change of the knee will need a replacement at some time in the future.  Depending on findings we will discuss the possibility of other modalities but patient states that other than the cyst and not having much pain in the knee.

## 2020-09-04 NOTE — Patient Instructions (Signed)
Happy Holidays You know the drill Will likely bruise a bit See me in 10-12 weeks

## 2020-10-28 ENCOUNTER — Encounter: Payer: Self-pay | Admitting: Internal Medicine

## 2020-10-29 MED ORDER — CYCLOBENZAPRINE HCL 5 MG PO TABS: 5.0000 mg | ORAL_TABLET | Freq: Three times a day (TID) | ORAL | 1 refills | Status: DC | PRN

## 2020-11-25 ENCOUNTER — Other Ambulatory Visit: Payer: Self-pay | Admitting: Internal Medicine

## 2020-11-25 NOTE — Telephone Encounter (Signed)
Please refill as per office routine med refill policy (all routine meds refilled for 3 mo or monthly per pt preference up to one year from last visit, then month to month grace period for 3 mo, then further med refills will have to be denied)  

## 2020-12-05 NOTE — Progress Notes (Unsigned)
[facility] [address] [place] Phone: [phone] Subjective:   I [name] am serving as a Education administrator for Dr. Hulan Saas.  This visit occurred during the SARS-CoV-2 public health emergency.  Safety protocols were in place, including screening questions prior to the visit, additional usage of staff PPE, and extensive cleaning of exam room while observing appropriate contact time as indicated for disinfecting solutions.   I'm seeing this patient by the request  of:  Biagio Borg, MD  CC: Right knee pain and swelling follow-up  PJS:[id number]   08/25/2020 Noted on MRI.  Discussed posture and ergonomics, discussed bracing.  Follow-up with me as needed.  Patient wants to hold on surgical intervention as long as possible patient had aspiration again today.  If we do try a serial aspiration I would like to either poked twice this next time to try to get better resolution of the ganglion cyst.  Patient is in agreement with the plan and will follow up again in 10 to 12 weeks.  Chronic problem with exacerbation  Update 12/05/2020 ALI MOHL is a 60 y.o. male coming in with complaint of right knee pain. Patient states he needs the knee drained again. States his cyst came back.     Patient's previous MRI in November 2021 showed the patient does have unfortunately tricompartmental osteoarthritic changes of the knee.  Patient also has a cystic structure superficial to the MCL and seems to be a ganglion cyst but we have done an aspiration on previously.  Past Medical History:  Diagnosis Date  . Allergic rhinitis, cause unspecified 01/15/2012  . Allergy   . Cholelithiasis 01/20/2018  . Diabetes mellitus   . DIABETES MELLITUS, TYPE II 05/24/2007   Qualifier: Diagnosis of  By: Dance CMA ([place]), Kim    . HYPERLIPIDEMIA 10/06/2007   Qualifier: Diagnosis of  By: Alain Marion MD, Evie Lacks   . HYPERTENSION 02/06/2008   Qualifier: Diagnosis of  By:  Alain Marion MD, Evie Lacks   . Lumbar disc disease    s/p surgury approx 1992  . PSA, INCREASED 05/11/2009   Qualifier: Diagnosis of  By: Ronnald Ramp MD, Arvid Right.   . Vitamin D deficiency 01/26/2020   Past Surgical History:  Procedure Laterality Date  . BACK SURGERY  1989   Lumbar/Ruptured Disk  . HERNIA REPAIR  may 2012   left inguinal  . KNEE ARTHROSCOPY Right 2014  . NASAL SEPTUM SURGERY     Social History   Socioeconomic History  . Marital status: Married    Spouse name: Not on file  . Number of children: Not on file  . Years of education: Not on file  . Highest education level: Not on file  Occupational History  . Not on file  Tobacco Use  . Smoking status: Never Smoker  . Smokeless tobacco: Never Used  Vaping Use  . Vaping Use: Never used  Substance and Sexual Activity  . Alcohol use: Yes    Comment: very rare   . Drug use: No  . Sexual activity: Not on file  Other Topics Concern  . Not on file  Social History Narrative  . Not on file   Social Determinants of Health   Financial Resource Strain: Not on file  Food Insecurity: Not on file  Transportation Needs: Not on file  Physical Activity: Not on file  Stress: Not on file  Social Connections: Not on file   No Known Allergies Family History  Problem Relation Age of Onset  . Hypertension Mother   . Diabetes Mother   . Cancer Mother   . Hypertension Father   . Diabetes Father   . Colon cancer Neg Hx     Current Outpatient Medications (Endocrine & Metabolic):  .  metFORMIN (GLUCOPHAGE) 500 MG tablet, TAKE 2 TABLETS BY MOUTH TWICE DAILY WITH A MEAL  Current Outpatient Medications (Cardiovascular):  .  lisinopril (ZESTRIL) 20 MG tablet, Take 1 tablet by mouth once daily .  rosuvastatin (CRESTOR) 40 MG tablet, Take 1 tablet by mouth once daily .  tadalafil (CIALIS) 20 MG tablet, Take 1 tablet (20 mg total) by mouth daily as needed for erectile dysfunction.  Current Outpatient Medications (Respiratory):  .   guaiFENesin-codeine (CHERATUSSIN AC) 100-10 MG/5ML syrup, Take 5 mLs by mouth 3 (three) times daily as needed for cough. .  triamcinolone (NASACORT) 55 MCG/ACT AERO nasal inhaler, Place 2 sprays into the nose daily.  Current Outpatient Medications (Analgesics):  .  aspirin 81 MG EC tablet, Take 1 tablet (81 mg total) by mouth daily. Swallow whole.   Current Outpatient Medications (Other):  .  Blood Glucose Monitoring Suppl (ONE TOUCH ULTRA 2) W/DEVICE KIT, Use as directed daily .  cyclobenzaprine (FLEXERIL) 5 MG tablet, Take 1 tablet (5 mg total) by mouth 3 (three) times daily as needed for muscle spasms. .  Diclofenac Sodium (PENNSAID) 2 % SOLN, Place 2 g onto the skin 2 (two) times daily. Marland Kitchen  doxycycline (VIBRA-TABS) 100 MG tablet, Take 1 tablet (100 mg total) by mouth 2 (two) times daily. Marland Kitchen  glucose blood (ONE TOUCH ULTRA TEST) test strip, Use as instructed once daily. Diagnosis 250.0 .  Lancets MISC, 1 application by Does not apply route daily.   Reviewed prior external information including notes and imaging from  primary care provider As well as notes that were available from care everywhere and other healthcare systems.  Past medical history, social, surgical and family history all reviewed in electronic medical record.  No pertanent information unless stated regarding to the chief complaint.   Review of Systems:  No headache, visual changes, nausea, vomiting, diarrhea, constipation, dizziness, abdominal pain, skin rash, fevers, chills, night sweats, weight loss, swollen lymph nodes, body aches, joint swelling, chest pain, shortness of breath, mood changes. POSITIVE muscle aches  Objective  Blood pressure (!) 160/90, pulse 78, height 6' 2"  (1.88 m), weight 252 lb (114.3 kg), SpO2 98 %.   General: No apparent distress alert and oriented x3 mood and affect normal, dressed appropriately.  HEENT: Pupils equal, extraocular movements intact  Respiratory: Patient's speak in full  sentences and does not appear short of breath  Cardiovascular: No lower extremity edema, non tender, no erythema  Gait antalgic gait MSK: Knee: valgus deformity noted.significant swelling over the medial asspect of the knee, bigger then it has been  Tender to palpation over medial and PF joint line.  ROM full in flexion and extension and lower leg rotation. instability with valgus force.  painful patellar compression. Patellar glide with moderate crepitus. Patellar and quadriceps tendons unremarkable. Hamstring and quadriceps strength is normal.  Procedure: Real-time Ultrasound Guided Injection of ganglion cyst of the right medial knee Device: GE Logiq Q7 Ultrasound guided injection is preferred based studies that show increased duration, increased effect, greater accuracy, decreased procedural pain, increased response rate, and decreased cost with ultrasound guided versus blind injection.  Verbal informed consent obtained.  Time-out conducted.  Noted no overlying erythema, induration, or other signs of  local infection.  Skin prepped in a sterile fashion.  Local anesthesia: Topical Ethyl chloride.  With sterile technique and under real time ultrasound guidance: With an 18-gauge 1-1/2 inch needle patient was injected with 2 cc of 0.5% Marcaine and aspirated significant amount of jellylike fluid.  Injected 1 cc of Kenalog 40 mg/mL Completed mild difficulty secondary to the thickness of the general having to try to push out as much as possible. Pain immediately improved suggesting accurate placement of the medication.  Advised to call if fevers/chills, erythema, induration, drainage, or persistent bleeding.  Impression: Technically successful ultrasound guided injection.     Impression and Recommendations:     The above documentation has been reviewed and is accurate and complete Lyndal Pulley, DO

## 2020-12-06 ENCOUNTER — Other Ambulatory Visit: Payer: Self-pay

## 2020-12-06 ENCOUNTER — Ambulatory Visit: Payer: 59 | Admitting: Family Medicine

## 2020-12-06 ENCOUNTER — Ambulatory Visit: Payer: Self-pay

## 2020-12-06 ENCOUNTER — Encounter: Payer: Self-pay | Admitting: Family Medicine

## 2020-12-06 VITALS — BP 160/90 | HR 78 | Ht 74.0 in | Wt 252.0 lb

## 2020-12-06 DIAGNOSIS — M25561 Pain in right knee: Secondary | ICD-10-CM | POA: Diagnosis not present

## 2020-12-06 DIAGNOSIS — M1711 Unilateral primary osteoarthritis, right knee: Secondary | ICD-10-CM

## 2020-12-06 DIAGNOSIS — M6789 Other specified disorders of synovium and tendon, multiple sites: Secondary | ICD-10-CM

## 2020-12-06 DIAGNOSIS — M6749 Ganglion, multiple sites: Secondary | ICD-10-CM | POA: Diagnosis not present

## 2020-12-06 DIAGNOSIS — G8929 Other chronic pain: Secondary | ICD-10-CM

## 2020-12-06 DIAGNOSIS — M7139 Other bursal cyst, multiple sites: Secondary | ICD-10-CM

## 2020-12-06 MED ORDER — DOXYCYCLINE HYCLATE 100 MG PO TABS: 100.0000 mg | ORAL_TABLET | Freq: Two times a day (BID) | ORAL | 0 refills | Status: DC

## 2020-12-06 NOTE — Assessment & Plan Note (Signed)
Patient may need replacement at some point in the future but at this point we will take care of the cyst

## 2020-12-06 NOTE — Patient Instructions (Addendum)
Good to see you Antibiotic sent in  I suggest the surgery See me when you need me

## 2020-12-06 NOTE — Assessment & Plan Note (Addendum)
Attempted aspiration again.  Patient now has sent for done in the course of a year.  At this point I do feel that patient would need surgical intervention to have this removed.  MRI does show it is superficial to the MCL.  We discussed which activities to do which wants to avoid.  Increase activity slowly.  Follow-up with me again only as needed. Due to the amount of manipulation we had to do with the cyst did put patient on the doxycycline to be safe over the course of the weekend.  Patient does not need to take it if he is feeling relatively well but I think it would be beneficial.

## 2021-01-01 ENCOUNTER — Ambulatory Visit: Payer: 59 | Admitting: Family Medicine

## 2021-01-03 LAB — HM DIABETES EYE EXAM

## 2021-01-07 ENCOUNTER — Encounter: Payer: Self-pay | Admitting: Internal Medicine

## 2021-01-18 ENCOUNTER — Other Ambulatory Visit (INDEPENDENT_AMBULATORY_CARE_PROVIDER_SITE_OTHER): Payer: 59

## 2021-01-18 DIAGNOSIS — E559 Vitamin D deficiency, unspecified: Secondary | ICD-10-CM

## 2021-01-18 DIAGNOSIS — E1165 Type 2 diabetes mellitus with hyperglycemia: Secondary | ICD-10-CM

## 2021-01-18 DIAGNOSIS — Z0000 Encounter for general adult medical examination without abnormal findings: Secondary | ICD-10-CM

## 2021-01-18 LAB — URINALYSIS, ROUTINE W REFLEX MICROSCOPIC
Bilirubin Urine: NEGATIVE
Hgb urine dipstick: NEGATIVE
Ketones, ur: NEGATIVE
Leukocytes,Ua: NEGATIVE
Nitrite: NEGATIVE
RBC / HPF: NONE SEEN (ref 0–?)
Specific Gravity, Urine: >=1.03 — AB (ref 1.000–1.030)
Total Protein, Urine: NEGATIVE
Urine Glucose: NEGATIVE
Urobilinogen, UA: 0.2 (ref 0.0–1.0)
WBC, UA: NONE SEEN (ref 0–?)
pH: 5 (ref 5.0–8.0)

## 2021-01-18 LAB — CBC WITH DIFFERENTIAL/PLATELET
Basophils Absolute: 0 10*3/uL (ref 0.0–0.1)
Basophils Relative: 1.1 % (ref 0.0–3.0)
Eosinophils Absolute: 0.1 10*3/uL (ref 0.0–0.7)
Eosinophils Relative: 1.8 % (ref 0.0–5.0)
HCT: 40.5 % (ref 39.0–52.0)
Hemoglobin: 13.3 g/dL (ref 13.0–17.0)
Lymphocytes Relative: 31.2 % (ref 12.0–46.0)
Lymphs Abs: 1 10*3/uL (ref 0.7–4.0)
MCHC: 32.9 g/dL (ref 30.0–36.0)
MCV: 84.2 fl (ref 78.0–100.0)
Monocytes Absolute: 0.4 10*3/uL (ref 0.1–1.0)
Monocytes Relative: 12.2 % — ABNORMAL HIGH (ref 3.0–12.0)
Neutro Abs: 1.7 10*3/uL (ref 1.4–7.7)
Neutrophils Relative %: 53.7 % (ref 43.0–77.0)
Platelets: 204 10*3/uL (ref 150.0–400.0)
RBC: 4.81 Mil/uL (ref 4.22–5.81)
RDW: 14.8 % (ref 11.5–15.5)
WBC: 3.2 10*3/uL — ABNORMAL LOW (ref 4.0–10.5)

## 2021-01-18 LAB — BASIC METABOLIC PANEL
BUN: 16 mg/dL (ref 6–23)
CO2: 29 mEq/L (ref 19–32)
Calcium: 9.5 mg/dL (ref 8.4–10.5)
Chloride: 105 mEq/L (ref 96–112)
Creatinine, Ser: 1.01 mg/dL (ref 0.40–1.50)
GFR: 81.79 mL/min (ref >60.00)
Glucose, Bld: 161 mg/dL — ABNORMAL HIGH (ref 70–99)
Potassium: 4 mEq/L (ref 3.5–5.1)
Sodium: 141 mEq/L (ref 135–145)

## 2021-01-18 LAB — LIPID PANEL
Cholesterol: 133 mg/dL (ref 0–200)
HDL: 51.1 mg/dL (ref >39.00)
LDL Cholesterol: 70 mg/dL (ref 0–99)
NonHDL: 81.92
Total CHOL/HDL Ratio: 3
Triglycerides: 60 mg/dL (ref 0.0–149.0)
VLDL: 12 mg/dL (ref 0.0–40.0)

## 2021-01-18 LAB — VITAMIN D 25 HYDROXY (VIT D DEFICIENCY, FRACTURES): VITD: 13.17 ng/mL — ABNORMAL LOW (ref 30.00–100.00)

## 2021-01-18 LAB — HEPATIC FUNCTION PANEL
ALT: 21 U/L (ref 0–53)
AST: 20 U/L (ref 0–37)
Albumin: 4.3 g/dL (ref 3.5–5.2)
Alkaline Phosphatase: 76 U/L (ref 39–117)
Bilirubin, Direct: 0.1 mg/dL (ref 0.0–0.3)
Total Bilirubin: 0.4 mg/dL (ref 0.2–1.2)
Total Protein: 6.8 g/dL (ref 6.0–8.3)

## 2021-01-18 LAB — HEMOGLOBIN A1C: Hgb A1c MFr Bld: 8.5 % — ABNORMAL HIGH (ref 4.6–6.5)

## 2021-01-18 LAB — MICROALBUMIN / CREATININE URINE RATIO
Creatinine,U: 195.4 mg/dL
Microalb Creat Ratio: 1.1 mg/g (ref 0.0–30.0)
Microalb, Ur: 2.1 mg/dL — ABNORMAL HIGH (ref 0.0–1.9)

## 2021-01-18 LAB — TSH: TSH: 1.11 u[IU]/mL (ref 0.35–4.50)

## 2021-01-18 LAB — PSA: PSA: 2.12 ng/mL (ref 0.10–4.00)

## 2021-01-21 ENCOUNTER — Other Ambulatory Visit: Payer: 59

## 2021-01-25 ENCOUNTER — Ambulatory Visit: Payer: 59 | Admitting: Internal Medicine

## 2021-01-25 ENCOUNTER — Encounter: Payer: Self-pay | Admitting: Internal Medicine

## 2021-01-25 ENCOUNTER — Other Ambulatory Visit: Payer: Self-pay

## 2021-01-25 VITALS — BP 146/100 | HR 65 | Temp 98.3°F | Ht 74.0 in | Wt 249.0 lb

## 2021-01-25 DIAGNOSIS — I10 Essential (primary) hypertension: Secondary | ICD-10-CM | POA: Diagnosis not present

## 2021-01-25 DIAGNOSIS — Z0001 Encounter for general adult medical examination with abnormal findings: Secondary | ICD-10-CM

## 2021-01-25 DIAGNOSIS — E7800 Pure hypercholesterolemia, unspecified: Secondary | ICD-10-CM

## 2021-01-25 DIAGNOSIS — E559 Vitamin D deficiency, unspecified: Secondary | ICD-10-CM | POA: Diagnosis not present

## 2021-01-25 DIAGNOSIS — E1165 Type 2 diabetes mellitus with hyperglycemia: Secondary | ICD-10-CM

## 2021-01-25 MED ORDER — DAPAGLIFLOZIN PROPANEDIOL 10 MG PO TABS: 10.0000 mg | ORAL_TABLET | Freq: Every day | ORAL | 3 refills | Status: DC

## 2021-01-25 MED ORDER — THERA-D 2000 50 MCG (2000 UT) PO TABS: ORAL_TABLET | ORAL | 99 refills | Status: DC

## 2021-01-25 NOTE — Progress Notes (Signed)
Patient ID: Howard [name], male   DOB: Nov 18, 1960, 60 y.o.   MRN: [id number]         Chief Complaint:: wellness exam and Follow-up  HTN, low VIt D, DM, HLD       HPI:  Howard [name] is a 60 y.o. male here for wellness exam declines pneumovax; o/w up to date with preventive referrals, and immunizations                        Also states did not take BP med at home this AM, and BP at home is usually < 140/90 on regular basis.  Was rushing to get here.  Pt denies chest pain, increased sob or doe, wheezing, orthopnea, PND, increased LE swelling, palpitations, dizziness or syncope.  Denies new neuro focal s/s.   Pt denies polydipsia, polyuria Not taking VIt D.   Pt denies fever, wt loss, night sweats, loss of appetite, or other constitutional symptoms   Pt denies fever, wt loss, night sweats, loss of appetite, or other constitutional symptoms  Denies worsening depressive symptoms, suicidal ideation, or panic.      Wt Readings from Last 3 Encounters:  01/25/21 249 lb (112.9 kg)  12/06/20 252 lb (114.3 kg)  09/04/20 251 lb (113.9 kg)   BP Readings from Last 3 Encounters:  01/25/21 (!) 146/100  12/06/20 (!) 160/90  09/04/20 (!) 116/92   Immunization History  Administered Date(s) Administered  . PFIZER(Purple Top)SARS-COV-2 Vaccination 01/11/2020, 02/01/2020, 08/13/2020   There are no preventive care reminders to display for this patient.    Past Medical History:  Diagnosis Date  . Allergic rhinitis, cause unspecified 01/15/2012  . Allergy   . Cholelithiasis 01/20/2018  . Diabetes mellitus   . DIABETES MELLITUS, TYPE II 05/24/2007   Qualifier: Diagnosis of  By: Dance CMA ([place]), Kim    . HYPERLIPIDEMIA 10/06/2007   Qualifier: Diagnosis of  By: Alain Marion MD, Evie Lacks   . HYPERTENSION 02/06/2008   Qualifier: Diagnosis of  By: Alain Marion MD, Evie Lacks   . Lumbar disc disease    s/p surgury approx 1992  . PSA, INCREASED 05/11/2009   Qualifier: Diagnosis of  By: Ronnald Ramp MD, Arvid Right.   .  Vitamin D deficiency 01/26/2020   Past Surgical History:  Procedure Laterality Date  . BACK SURGERY  1989   Lumbar/Ruptured Disk  . HERNIA REPAIR  may 2012   left inguinal  . KNEE ARTHROSCOPY Right 2014  . NASAL SEPTUM SURGERY      reports that he has never smoked. He has never used smokeless tobacco. He reports current alcohol use. He reports that he does not use drugs. family history includes Cancer in his mother; Diabetes in his father and mother; Hypertension in his father and mother. No Known Allergies Current Outpatient Medications on File Prior to Visit  Medication Sig Dispense Refill  . aspirin 81 MG EC tablet Take 1 tablet (81 mg total) by mouth daily. Swallow whole. 30 tablet 12  . Blood Glucose Monitoring Suppl (ONE TOUCH ULTRA 2) W/DEVICE KIT Use as directed daily 1 each 0  . cyclobenzaprine (FLEXERIL) 5 MG tablet Take 1 tablet (5 mg total) by mouth 3 (three) times daily as needed for muscle spasms. 30 tablet 1  . Diclofenac Sodium (PENNSAID) 2 % SOLN Place 2 g onto the skin 2 (two) times daily. 112 g 3  . glucose blood (ONE TOUCH ULTRA TEST) test strip Use as instructed once daily. Diagnosis  250.0 100 each 12  . guaiFENesin-codeine (CHERATUSSIN AC) 100-10 MG/5ML syrup Take 5 mLs by mouth 3 (three) times daily as needed for cough. 180 mL 0  . Lancets MISC 1 application by Does not apply route daily. 100 each 12  . lisinopril (ZESTRIL) 20 MG tablet Take 1 tablet by mouth once daily 90 tablet 0  . metFORMIN (GLUCOPHAGE) 500 MG tablet TAKE 2 TABLETS BY MOUTH TWICE DAILY WITH A MEAL 360 tablet 0  . rosuvastatin (CRESTOR) 40 MG tablet Take 1 tablet by mouth once daily 90 tablet 0  . tadalafil (CIALIS) 20 MG tablet Take 1 tablet (20 mg total) by mouth daily as needed for erectile dysfunction. 10 tablet 11  . triamcinolone (NASACORT) 55 MCG/ACT AERO nasal inhaler Place 2 sprays into the nose daily. 1 Inhaler 12  . doxycycline (VIBRA-TABS) 100 MG tablet Take 1 tablet (100 mg total) by  mouth 2 (two) times daily. (Patient not taking: Reported on 01/25/2021) 20 tablet 0   No current facility-administered medications on file prior to visit.        ROS:  All others reviewed and negative.  Objective        PE:  BP (!) 146/100 (BP Location: Left Arm, Patient Position: Sitting, Cuff Size: Large)   Pulse 65   Temp 98.3 F (36.8 C) (Oral)   Ht 6' 2"  (1.88 m)   Wt 249 lb (112.9 kg)   SpO2 96%   BMI 31.97 kg/m                 Constitutional: Pt appears in NAD               HENT: Head: NCAT.                Right Ear: External ear normal.                 Left Ear: External ear normal.                Eyes: . Pupils are equal, round, and reactive to light. Conjunctivae and EOM are normal               Nose: without d/c or deformity               Neck: Neck supple. Gross normal ROM               Cardiovascular: Normal rate and regular rhythm.                 Pulmonary/Chest: Effort normal and breath sounds without rales or wheezing.                Abd:  Soft, NT, ND, + BS, no organomegaly               Neurological: Pt is alert. At baseline orientation, motor grossly intact               Skin: Skin is warm. No rashes, no other new lesions, LE edema - none               Psychiatric: Pt behavior is normal without agitation   Micro: none  Cardiac tracings I have personally interpreted today:  none  Pertinent Radiological findings (summarize): none   Lab Results  Component Value Date   WBC 3.2 (L) 01/18/2021   HGB 13.3 01/18/2021   HCT 40.5 01/18/2021   PLT 204.0 01/18/2021   GLUCOSE 161 (H) 01/18/2021  CHOL 133 01/18/2021   TRIG 60.0 01/18/2021   HDL 51.10 01/18/2021   LDLDIRECT 127.5 04/02/2007   LDLCALC 70 01/18/2021   ALT 21 01/18/2021   AST 20 01/18/2021   NA 141 01/18/2021   K 4.0 01/18/2021   CL 105 01/18/2021   CREATININE 1.01 01/18/2021   BUN 16 01/18/2021   CO2 29 01/18/2021   TSH 1.11 01/18/2021   PSA 2.12 01/18/2021   HGBA1C 8.5 (H) 01/18/2021    MICROALBUR 2.1 (H) 01/18/2021   Assessment/Plan:  Howard [name] is a 60 y.o. Black or African American [2] male with  has a past medical history of Allergic rhinitis, cause unspecified (01/15/2012), Allergy, Cholelithiasis (01/20/2018), Diabetes mellitus, DIABETES MELLITUS, TYPE II (05/24/2007), HYPERLIPIDEMIA (10/06/2007), HYPERTENSION (02/06/2008), Lumbar disc disease, PSA, INCREASED (05/11/2009), and Vitamin D deficiency (01/26/2020).  Encounter for well adult exam with abnormal findings Age and sex appropriate education and counseling updated with regular exercise and diet Referrals for preventative services - none needed Immunizations addressed - declines pneumovax Smoking counseling  - none needed Evidence for depression or other mood disorder - none significant Most recent labs reviewed. I have personally reviewed and have noted: 1) the patient's medical and social history 2) The patient's current medications and supplements 3) The patient's height, weight, and BMI have been recorded in the chart  Vitamin D deficiency Last vitamin D Lab Results  Component Value Date   VD25OH 13.17 (L) 01/18/2021   Low to start oral replacement   Hyperlipidemia Lab Results  Component Value Date   LDLCALC 70 01/18/2021   Stable, pt to continue current statin crestor 40   Essential hypertension BP Readings from Last 3 Encounters:  01/25/21 (!) 146/100  12/06/20 (!) 160/90  09/04/20 (!) 116/92   Stable, pt to continue medical treatment lisinopril   Diabetes ([place]) Uncontrolled, to add farxiga 10 qd  Lab Results  Component Value Date   HGBA1C 8.5 (H) 01/18/2021     Current Outpatient Medications (Endocrine & Metabolic):  .  dapagliflozin propanediol (FARXIGA) 10 MG TABS tablet, Take 1 tablet (10 mg total) by mouth daily before breakfast. .  metFORMIN (GLUCOPHAGE) 500 MG tablet, TAKE 2 TABLETS BY MOUTH TWICE DAILY WITH A MEAL  Current Outpatient Medications (Cardiovascular):  .   lisinopril (ZESTRIL) 20 MG tablet, Take 1 tablet by mouth once daily .  rosuvastatin (CRESTOR) 40 MG tablet, Take 1 tablet by mouth once daily .  tadalafil (CIALIS) 20 MG tablet, Take 1 tablet (20 mg total) by mouth daily as needed for erectile dysfunction.  Current Outpatient Medications (Respiratory):  .  guaiFENesin-codeine (CHERATUSSIN AC) 100-10 MG/5ML syrup, Take 5 mLs by mouth 3 (three) times daily as needed for cough. .  triamcinolone (NASACORT) 55 MCG/ACT AERO nasal inhaler, Place 2 sprays into the nose daily.  Current Outpatient Medications (Analgesics):  .  aspirin 81 MG EC tablet, Take 1 tablet (81 mg total) by mouth daily. Swallow whole.   Current Outpatient Medications (Other):  .  Blood Glucose Monitoring Suppl (ONE TOUCH ULTRA 2) W/DEVICE KIT, Use as directed daily .  Cholecalciferol (THERA-D 2000) 50 MCG (2000 UT) TABS, 1 tab by mouth once daily .  cyclobenzaprine (FLEXERIL) 5 MG tablet, Take 1 tablet (5 mg total) by mouth 3 (three) times daily as needed for muscle spasms. .  Diclofenac Sodium (PENNSAID) 2 % SOLN, Place 2 g onto the skin 2 (two) times daily. Marland Kitchen  glucose blood (ONE TOUCH ULTRA TEST) test strip, Use as instructed once daily.  Diagnosis 250.0 .  Lancets MISC, 1 application by Does not apply route daily. Marland Kitchen  doxycycline (VIBRA-TABS) 100 MG tablet, Take 1 tablet (100 mg total) by mouth 2 (two) times daily. (Patient not taking: Reported on 01/25/2021)   Followup: Return in about 6 months (around 07/27/2021).  Cathlean Cower, MD 01/26/2021 6:16 AM [facility]

## 2021-01-25 NOTE — Patient Instructions (Signed)
Please check your BP at home once daily for 10 days, and call if the number is more than 140/90  Please take all new medication as prescribed  - the Farxiga  Please take OTC Vitamin D3 at 2000 units per day, indefinitely.  We have discussed the Cardiac CT Score test to measure the calcification level (if any) in your heart arteries.  This test has been ordered in our [facility], so please call [facility] directly, as they prefer this, at [phone] to be scheduled.  Please continue all other medications as before, and refills have been done if requested.  Please have the pharmacy call with any other refills you may need.  Please continue your efforts at being more active, low cholesterol diet, and weight control.  You are otherwise up to date with prevention measures today.  Please keep your appointments with your specialists as you may have planned  Please make an Appointment to return in 6 months, or sooner if needed, also with Lab Appointment for testing done 3-5 days before at the [facility] (so this is for TWO appointments - please see the scheduling desk as you leave)  Due to the ongoing Covid 19 pandemic, our lab now requires an appointment for any labs done at our office.  If you need labs done and do not have an appointment, please call our office ahead of time to schedule before presenting to the lab for your testing.

## 2021-01-26 ENCOUNTER — Encounter: Payer: Self-pay | Admitting: Internal Medicine

## 2021-01-26 NOTE — Assessment & Plan Note (Signed)
Lab Results  Component Value Date   LDLCALC 70 01/18/2021   Stable, pt to continue current statin crestor 40

## 2021-01-26 NOTE — Assessment & Plan Note (Signed)
Uncontrolled, to add farxiga 10 qd  Lab Results  Component Value Date   HGBA1C 8.5 (H) 01/18/2021     Current Outpatient Medications (Endocrine & Metabolic):  .  dapagliflozin propanediol (FARXIGA) 10 MG TABS tablet, Take 1 tablet (10 mg total) by mouth daily before breakfast. .  metFORMIN (GLUCOPHAGE) 500 MG tablet, TAKE 2 TABLETS BY MOUTH TWICE DAILY WITH A MEAL  Current Outpatient Medications (Cardiovascular):  .  lisinopril (ZESTRIL) 20 MG tablet, Take 1 tablet by mouth once daily .  rosuvastatin (CRESTOR) 40 MG tablet, Take 1 tablet by mouth once daily .  tadalafil (CIALIS) 20 MG tablet, Take 1 tablet (20 mg total) by mouth daily as needed for erectile dysfunction.  Current Outpatient Medications (Respiratory):  .  guaiFENesin-codeine (CHERATUSSIN AC) 100-10 MG/5ML syrup, Take 5 mLs by mouth 3 (three) times daily as needed for cough. .  triamcinolone (NASACORT) 55 MCG/ACT AERO nasal inhaler, Place 2 sprays into the nose daily.  Current Outpatient Medications (Analgesics):  .  aspirin 81 MG EC tablet, Take 1 tablet (81 mg total) by mouth daily. Swallow whole.   Current Outpatient Medications (Other):  .  Blood Glucose Monitoring Suppl (ONE TOUCH ULTRA 2) W/DEVICE KIT, Use as directed daily .  Cholecalciferol (THERA-D 2000) 50 MCG (2000 UT) TABS, 1 tab by mouth once daily .  cyclobenzaprine (FLEXERIL) 5 MG tablet, Take 1 tablet (5 mg total) by mouth 3 (three) times daily as needed for muscle spasms. .  Diclofenac Sodium (PENNSAID) 2 % SOLN, Place 2 g onto the skin 2 (two) times daily. Marland Kitchen  glucose blood (ONE TOUCH ULTRA TEST) test strip, Use as instructed once daily. Diagnosis 250.0 .  Lancets MISC, 1 application by Does not apply route daily. Marland Kitchen  doxycycline (VIBRA-TABS) 100 MG tablet, Take 1 tablet (100 mg total) by mouth 2 (two) times daily. (Patient not taking: Reported on 01/25/2021)

## 2021-01-26 NOTE — Assessment & Plan Note (Signed)
Age and sex appropriate education and counseling updated with regular exercise and diet Referrals for preventative services - none needed Immunizations addressed - declines pneumovax Smoking counseling  - none needed Evidence for depression or other mood disorder - none significant Most recent labs reviewed. I have personally reviewed and have noted: 1) the patient's medical and social history 2) The patient's current medications and supplements 3) The patient's height, weight, and BMI have been recorded in the chart

## 2021-01-26 NOTE — Assessment & Plan Note (Signed)
Last vitamin D Lab Results  Component Value Date   VD25OH 13.17 (L) 01/18/2021   Low to start oral replacement

## 2021-01-26 NOTE — Assessment & Plan Note (Signed)
BP Readings from Last 3 Encounters:  01/25/21 (!) 146/100  12/06/20 (!) 160/90  09/04/20 (!) 116/92   Stable, pt to continue medical treatment lisinopril

## 2021-01-29 ENCOUNTER — Other Ambulatory Visit: Payer: Self-pay | Admitting: Internal Medicine

## 2021-01-29 ENCOUNTER — Telehealth: Payer: Self-pay | Admitting: Internal Medicine

## 2021-01-29 NOTE — Telephone Encounter (Signed)
Patient notified of meds not being covered and to contact insurance company to see what is covered

## 2021-01-29 NOTE — Telephone Encounter (Signed)
Ok to contact pt for this med that is not covered by insurance  We mentioned this might happen at his visit, and I was hoping he would ask which medication like farxiga is covered, so we can just change it to that one.

## 2021-01-29 NOTE — Telephone Encounter (Signed)
[facility] called and said that dapagliflozin propanediol (FARXIGA) 10 MG TABS tablet is not cover under the patients insurance. They said that Vania Rea was covered. Please advise

## 2021-02-17 ENCOUNTER — Encounter: Payer: Self-pay | Admitting: Internal Medicine

## 2021-02-17 DIAGNOSIS — E119 Type 2 diabetes mellitus without complications: Secondary | ICD-10-CM

## 2021-02-18 MED ORDER — GLUCOSE BLOOD VI STRP: ORAL_STRIP | 12 refills | Status: DC

## 2021-02-18 MED ORDER — LISINOPRIL 40 MG PO TABS
40.0000 mg | ORAL_TABLET | Freq: Every day | ORAL | 3 refills | Status: DC
Start: 2021-02-18 — End: 2022-02-07

## 2021-02-26 NOTE — Progress Notes (Signed)
[place] Phone: [phone] Subjective:   [name], am serving as a scribe for Dr. Hulan Saas. This visit occurred during the SARS-CoV-2 public health emergency.  Safety protocols were in place, including screening questions prior to the visit, additional usage of staff PPE, and extensive cleaning of exam room while observing appropriate contact time as indicated for disinfecting solutions.   I'm seeing this patient by the request  of:  Biagio Borg, MD  CC: Right knee pain follow-up  WIO:[id number]   12/06/2020 Attempted aspiration again.  Patient now has sent for done in the course of a year.  At this point I do feel that patient would need surgical intervention to have this removed.  MRI does show it is superficial to the MCL.  We discussed which activities to do which wants to avoid.  Increase activity slowly.  Follow-up with me again only as needed. Due to the amount of manipulation we had to do with the cyst did put patient on the doxycycline to be safe over the course of the weekend.  Patient does not need to take it if he is feeling relatively well but I think it would be beneficial.  Patient may need replacement at some point in the future but at this point we will take care of the cyst   Update 02/27/2021 Howard [name] is a 60 y.o. male coming in with complaint of R knee pain. Cyst has accumulated again on medial aspect of knee. Patient states that he would like to speak with surgeon about removal.  Patient is having increasing discomfort again.  This week and his daughter is getting married and would like to not have the pain.      Past Medical History:  Diagnosis Date  . Allergic rhinitis, cause unspecified 01/15/2012  . Allergy   . Cholelithiasis 01/20/2018  . Diabetes mellitus   . DIABETES MELLITUS, TYPE II 05/24/2007   Qualifier: Diagnosis of  By: Dance CMA ([place]), Kim    . HYPERLIPIDEMIA  10/06/2007   Qualifier: Diagnosis of  By: Alain Marion MD, Evie Lacks   . HYPERTENSION 02/06/2008   Qualifier: Diagnosis of  By: Alain Marion MD, Evie Lacks   . Lumbar disc disease    s/p surgury approx 1992  . PSA, INCREASED 05/11/2009   Qualifier: Diagnosis of  By: Ronnald Ramp MD, Arvid Right.   . Vitamin D deficiency 01/26/2020   Past Surgical History:  Procedure Laterality Date  . BACK SURGERY  1989   Lumbar/Ruptured Disk  . HERNIA REPAIR  may 2012   left inguinal  . KNEE ARTHROSCOPY Right 2014  . NASAL SEPTUM SURGERY     Social History   Socioeconomic History  . Marital status: Married    Spouse name: Not on file  . Number of children: Not on file  . Years of education: Not on file  . Highest education level: Not on file  Occupational History  . Not on file  Tobacco Use  . Smoking status: Never Smoker  . Smokeless tobacco: Never Used  Vaping Use  . Vaping Use: Never used  Substance and Sexual Activity  . Alcohol use: Yes    Comment: very rare   . Drug use: No  . Sexual activity: Not on file  Other Topics Concern  . Not on file  Social History Narrative  . Not on file   Social Determinants of Health   Financial Resource Strain: Not  on file  Food Insecurity: Not on file  Transportation Needs: Not on file  Physical Activity: Not on file  Stress: Not on file  Social Connections: Not on file   No Known Allergies Family History  Problem Relation Age of Onset  . Hypertension Mother   . Diabetes Mother   . Cancer Mother   . Hypertension Father   . Diabetes Father   . Colon cancer Neg Hx     Current Outpatient Medications (Endocrine & Metabolic):  .  dapagliflozin propanediol (FARXIGA) 10 MG TABS tablet, Take 1 tablet (10 mg total) by mouth daily before breakfast. .  metFORMIN (GLUCOPHAGE) 500 MG tablet, TAKE 2 TABLETS BY MOUTH TWICE DAILY WITH A MEAL  Current Outpatient Medications (Cardiovascular):  .  lisinopril (ZESTRIL) 40 MG tablet, Take 1 tablet (40 mg total) by  mouth daily. .  rosuvastatin (CRESTOR) 40 MG tablet, Take 1 tablet by mouth once daily .  tadalafil (CIALIS) 20 MG tablet, Take 1 tablet (20 mg total) by mouth daily as needed for erectile dysfunction.  Current Outpatient Medications (Respiratory):  .  guaiFENesin-codeine (CHERATUSSIN AC) 100-10 MG/5ML syrup, Take 5 mLs by mouth 3 (three) times daily as needed for cough. .  triamcinolone (NASACORT) 55 MCG/ACT AERO nasal inhaler, Place 2 sprays into the nose daily.  Current Outpatient Medications (Analgesics):  .  aspirin 81 MG EC tablet, Take 1 tablet (81 mg total) by mouth daily. Swallow whole.   Current Outpatient Medications (Other):  .  Blood Glucose Monitoring Suppl (ONE TOUCH ULTRA 2) W/DEVICE KIT, Use as directed daily .  Cholecalciferol (THERA-D 2000) 50 MCG (2000 UT) TABS, 1 tab by mouth once daily .  cyclobenzaprine (FLEXERIL) 5 MG tablet, Take 1 tablet (5 mg total) by mouth 3 (three) times daily as needed for muscle spasms. .  Diclofenac Sodium (PENNSAID) 2 % SOLN, Place 2 g onto the skin 2 (two) times daily. Marland Kitchen  glucose blood (ONE TOUCH ULTRA TEST) test strip, Use as instructed once daily. Diagnosis 250.0 .  Lancets MISC, 1 application by Does not apply route daily.   Reviewed prior external information including notes and imaging from  primary care provider As well as notes that were available from care everywhere and other healthcare systems.  Past medical history, social, surgical and family history all reviewed in electronic medical record.  No pertanent information unless stated regarding to the chief complaint.   Review of Systems:  No headache, visual changes, nausea, vomiting, diarrhea, constipation, dizziness, abdominal pain, skin rash, fevers, chills, night sweats, weight loss, swollen lymph nodes, body aches, joint swelling, chest pain, shortness of breath, mood changes. POSITIVE muscle aches  Objective  Blood pressure 120/82, pulse 81, height 6' 2"  (1.88 m),  weight 250 lb (113.4 kg), SpO2 98 %.   General: No apparent distress alert and oriented x3 mood and affect normal, dressed appropriately.  HEENT: Pupils equal, extraocular movements intact  Respiratory: Patient's speak in full sentences and does not appear short of breath  Cardiovascular: No lower extremity edema, non tender, no erythema  Gait mild antalgic MSK: Right knee exam still has a very large cyst noted on the medial aspect of the knee that goes from anterior to posterior.  Very taught.  Patient lacks the last 2 degrees of extension of the knee in the last 10 degrees of flexion of the knee.  Procedure: Real-time Ultrasound Guided Injection of right ganglion cyst coming Device: GE Logiq Q7 Ultrasound guided injection is preferred based studies that  show increased duration, increased effect, greater accuracy, decreased procedural pain, increased response rate, and decreased cost with ultrasound guided versus blind injection.  Verbal informed consent obtained.  Time-out conducted.  Noted no overlying erythema, induration, or other signs of local infection.  Skin prepped in a sterile fashion.  Local anesthesia: Topical Ethyl chloride.  With sterile technique and under real time ultrasound guidance: With an 18-gauge 1 and half inch needle patient was injected with 1 cc of 0.5% Marcaine and aspirated significant amount of jellylike fluid.  Unable to get it more with the needle so did do significant pressure for greater than 5 minutes to remove as much gel as possible.  Then injected 1 cc of Kenalog 40 mg/mL. Completed without difficulty  Pain immediately improved suggesting accurate placement of the medication.  Advised to call if fevers/chills, erythema, induration, drainage, or persistent bleeding.  Patient given antibiotic with me going out of town.  Patient states that he had some at home. Impression: Technically successful ultrasound guided injection..    Impression and  Recommendations:     The above documentation has been reviewed and is accurate and complete Lyndal Pulley, DO

## 2021-02-27 ENCOUNTER — Ambulatory Visit: Payer: Self-pay

## 2021-02-27 ENCOUNTER — Encounter: Payer: Self-pay | Admitting: Family Medicine

## 2021-02-27 ENCOUNTER — Other Ambulatory Visit: Payer: Self-pay

## 2021-02-27 ENCOUNTER — Ambulatory Visit: Payer: 59 | Admitting: Family Medicine

## 2021-02-27 VITALS — BP 120/82 | HR 81 | Ht 74.0 in | Wt 250.0 lb

## 2021-02-27 DIAGNOSIS — G8929 Other chronic pain: Secondary | ICD-10-CM

## 2021-02-27 DIAGNOSIS — M6789 Other specified disorders of synovium and tendon, multiple sites: Secondary | ICD-10-CM

## 2021-02-27 DIAGNOSIS — M6749 Ganglion, multiple sites: Secondary | ICD-10-CM

## 2021-02-27 DIAGNOSIS — M7139 Other bursal cyst, multiple sites: Secondary | ICD-10-CM | POA: Diagnosis not present

## 2021-02-27 DIAGNOSIS — M25561 Pain in right knee: Secondary | ICD-10-CM

## 2021-02-27 NOTE — Patient Instructions (Signed)
Attempted to drain cyst Call Dr. Mayer Camel If any redness or swelling, seek medical attention See me when you need me

## 2021-03-04 ENCOUNTER — Encounter: Payer: Self-pay | Admitting: Internal Medicine

## 2021-03-04 MED ORDER — CHERATUSSIN AC 100-10 MG/5ML PO SOLN: 5.0000 mL | Freq: Three times a day (TID) | ORAL | 0 refills | Status: DC | PRN

## 2021-03-04 MED ORDER — NIRMATRELVIR/RITONAVIR (PAXLOVID)TABLET: 3.0000 | ORAL_TABLET | Freq: Two times a day (BID) | ORAL | 0 refills | Status: AC

## 2021-04-28 ENCOUNTER — Other Ambulatory Visit: Payer: Self-pay | Admitting: Internal Medicine

## 2021-05-02 ENCOUNTER — Other Ambulatory Visit: Payer: Self-pay | Admitting: Internal Medicine

## 2021-05-08 ENCOUNTER — Other Ambulatory Visit: Payer: Self-pay | Admitting: Internal Medicine

## 2021-05-08 NOTE — Telephone Encounter (Signed)
Please refill as per office routine med refill policy (all routine meds refilled for 3 mo or monthly per pt preference up to one year from last visit, then month to month grace period for 3 mo, then further med refills will have to be denied)  

## 2021-07-05 ENCOUNTER — Other Ambulatory Visit: Payer: Self-pay

## 2021-07-05 ENCOUNTER — Encounter: Payer: Self-pay | Admitting: Internal Medicine

## 2021-07-05 ENCOUNTER — Ambulatory Visit: Payer: 59 | Admitting: Internal Medicine

## 2021-07-05 ENCOUNTER — Ambulatory Visit: Payer: 59 | Admitting: Sports Medicine

## 2021-07-05 VITALS — BP 160/94 | HR 73 | Ht 74.0 in | Wt 248.0 lb

## 2021-07-05 VITALS — BP 160/100 | HR 73 | Temp 98.3°F | Ht 74.0 in | Wt 248.0 lb

## 2021-07-05 DIAGNOSIS — I10 Essential (primary) hypertension: Secondary | ICD-10-CM

## 2021-07-05 DIAGNOSIS — E1165 Type 2 diabetes mellitus with hyperglycemia: Secondary | ICD-10-CM | POA: Diagnosis not present

## 2021-07-05 DIAGNOSIS — E559 Vitamin D deficiency, unspecified: Secondary | ICD-10-CM | POA: Diagnosis not present

## 2021-07-05 DIAGNOSIS — S76012A Strain of muscle, fascia and tendon of left hip, initial encounter: Secondary | ICD-10-CM

## 2021-07-05 DIAGNOSIS — R1032 Left lower quadrant pain: Secondary | ICD-10-CM

## 2021-07-05 MED ORDER — MELOXICAM 15 MG PO TABS: 15.0000 mg | ORAL_TABLET | Freq: Every day | ORAL | 0 refills | Status: DC

## 2021-07-05 NOTE — Patient Instructions (Signed)
Your left groin pain appears to be a kind of muskuloskeletal strain that has now improved  Please continue all other medications as before, and refills have been done if requested.  Please have the pharmacy call with any other refills you may need.  Please continue your efforts at being more active, low cholesterol diet, and weight control.  Please keep your appointments with your specialists as you may have planned

## 2021-07-05 NOTE — Progress Notes (Signed)
Howard [name] D.[place] Phone: [phone]   Assessment and Plan:     1. Strain of hip flexor, left, initial encounter Uncomplicated, proving, initial sports medicine visit - Likely strain of hip flexor/adductor's versus irritation of previous hernia repair - HEP for hip flexor/abductor - Meloxicam 15 mg daily for 2 to 3 weeks - Rest, ice/heat - No physical restrictions  Other orders - meloxicam (MOBIC) 15 MG tablet; Take 1 tablet (15 mg total) by mouth daily.    Pertinent previous records reviewed include none   Follow Up: 4 weeks if no improvement or worsening of symptoms.  Would consider x-ray/ultrasound at that time   Subjective:   I, Howard [name], am serving as a scribe for Dr. Glennon Mac  Chief Complaint: groin pain   HPI: 60 year old Male presenting with Left groin pain   07/05/21 Patient states left groin pain going on for a week, patient did have a light slip more recently that irritated it. Describes pain as throbbing pain that comes and goes, does not radiate, pain is worse when walking or laying on the left side. patient taking ibuprofen, and using ice and heat.     Relevant Historical Information: None pertinent  Additional pertinent review of systems negative.   Current Outpatient Medications:    meloxicam (MOBIC) 15 MG tablet, Take 1 tablet (15 mg total) by mouth daily., Disp: 30 tablet, Rfl: 0   aspirin 81 MG EC tablet, Take 1 tablet (81 mg total) by mouth daily. Swallow whole., Disp: 30 tablet, Rfl: 12   Blood Glucose Monitoring Suppl (ONE TOUCH ULTRA 2) W/DEVICE KIT, Use as directed daily, Disp: 1 each, Rfl: 0   Cholecalciferol (THERA-D 2000) 50 MCG (2000 UT) TABS, 1 tab by mouth once daily, Disp: 30 tablet, Rfl: 99   cyclobenzaprine (FLEXERIL) 5 MG tablet, Take 1 tablet (5 mg total) by mouth 3 (three) times daily as needed for muscle spasms., Disp: 30 tablet, Rfl: 1    dapagliflozin propanediol (FARXIGA) 10 MG TABS tablet, Take 1 tablet (10 mg total) by mouth daily before breakfast., Disp: 90 tablet, Rfl: 3   Diclofenac Sodium (PENNSAID) 2 % SOLN, Place 2 g onto the skin 2 (two) times daily., Disp: 112 g, Rfl: 3   glucose blood (ONE TOUCH ULTRA TEST) test strip, Use as instructed once daily. Diagnosis 250.0, Disp: 100 each, Rfl: 12   guaiFENesin-codeine (CHERATUSSIN AC) 100-10 MG/5ML syrup, Take 5 mLs by mouth 3 (three) times daily as needed for cough., Disp: 180 mL, Rfl: 0   Lancets MISC, 1 application by Does not apply route daily., Disp: 100 each, Rfl: 12   lisinopril (ZESTRIL) 40 MG tablet, Take 1 tablet (40 mg total) by mouth daily., Disp: 90 tablet, Rfl: 3   metFORMIN (GLUCOPHAGE) 500 MG tablet, TAKE 2 TABLETS BY MOUTH TWICE DAILY WITH A MEAL, Disp: 360 tablet, Rfl: 0   rosuvastatin (CRESTOR) 40 MG tablet, Take 1 tablet by mouth once daily, Disp: 90 tablet, Rfl: 0   tadalafil (CIALIS) 20 MG tablet, Take 1 tablet (20 mg total) by mouth daily as needed for erectile dysfunction., Disp: 10 tablet, Rfl: 11   triamcinolone (NASACORT) 55 MCG/ACT AERO nasal inhaler, Place 2 sprays into the nose daily., Disp: 1 Inhaler, Rfl: 12   Objective:     Vitals:   07/05/21 0941  BP: (!) 160/94  Pulse: 73  SpO2: 98%  Weight: 248 lb (112.5 kg)  Height: 6' 2"  (1.88 m)      Body mass index is 31.84 kg/m.    Physical Exam:    General: awake, alert, and oriented no acute distress, nontoxic Skin: no suspicious lesions or rashes Neuro:sensation intact distally with no dificits, normal muscle tone, no atrophy, strength 5/5 in all tested lower ext groups Psych: normal mood and affect, speech clear  Left hip: No deformity, swelling or wasting ROM Flexion 30 (limited by pain), ext 30, IR 45, ER 45 NTTP over the hip flexors, adductors, inguinal canal, pubic ramus, greater troch, glute musculature, si joint, lumbar spine Negative log roll with FROM Negative  FABER Negative FADIR Negative Piriformis test Positive trendelenberg Gait normal  Pain with resisted flexion and abduction  Electronically signed by:  Howard [name] D.[facility] 10:33 AM 07/05/21

## 2021-07-05 NOTE — Patient Instructions (Signed)
Good to see you  Meloxicam '15mg'$  for 2-3 weeks  Hip flexor exercises given Follow in 2-4 weeks as needed

## 2021-07-05 NOTE — Progress Notes (Signed)
Patient ID: Howard [name], male   DOB: 04/14/1961, 60 y.o.   MRN: [id number]        Chief Complaint: follow up HTN, left groin pain, dm, vit d deficiency       HPI:  Howard [name] is a 60 y.o. male here with c/o 4 days onset left medial groin pain after a strong misstep with ambulation, severe to start, much milder today, intermittent, no radiation or other associated symptoms, worse to stand up, better to sit, no falls or other injury since the.  No local swelling or hernia.  Denies urinary symptoms such as dysuria, frequency, urgency, flank pain, hematuria or n/v, fever, chills.  Pt denies chest pain, increased sob or doe, wheezing, orthopnea, PND, increased LE swelling, palpitations, dizziness or syncope.   Pt denies polydipsia, polyuria, or new focal neuro s/s.   Pt denies fever, wt loss, night sweats, loss of appetite, or other constitutional symptoms         Wt Readings from Last 3 Encounters:  07/05/21 248 lb (112.5 kg)  07/05/21 248 lb (112.5 kg)  02/27/21 250 lb (113.4 kg)   BP Readings from Last 3 Encounters:  07/05/21 (!) 160/94  07/05/21 (!) 160/100  02/27/21 120/82         Past Medical History:  Diagnosis Date   Allergic rhinitis, cause unspecified 01/15/2012   Allergy    Cholelithiasis 01/20/2018   Diabetes mellitus    DIABETES MELLITUS, TYPE II 05/24/2007   Qualifier: Diagnosis of  By: Dance CMA ([facility]), Thurmon Fair 10/06/2007   Qualifier: Diagnosis of  By: Alain Marion MD, Evie Lacks    HYPERTENSION 02/06/2008   Qualifier: Diagnosis of  By: Alain Marion MD, Aleksei V    Lumbar disc disease    s/p surgury approx 1992   PSA, INCREASED 05/11/2009   Qualifier: Diagnosis of  By: Ronnald Ramp MD, Arvid Right.    Vitamin D deficiency 01/26/2020   Past Surgical History:  Procedure Laterality Date   BACK SURGERY  1989   Lumbar/Ruptured Disk   HERNIA REPAIR  may 2012   left inguinal   KNEE ARTHROSCOPY Right 2014   NASAL SEPTUM SURGERY      reports that he has never smoked. He  has never used smokeless tobacco. He reports current alcohol use. He reports that he does not use drugs. family history includes Cancer in his mother; Diabetes in his father and mother; Hypertension in his father and mother. No Known Allergies Current Outpatient Medications on File Prior to Visit  Medication Sig Dispense Refill   aspirin 81 MG EC tablet Take 1 tablet (81 mg total) by mouth daily. Swallow whole. 30 tablet 12   Blood Glucose Monitoring Suppl (ONE TOUCH ULTRA 2) W/DEVICE KIT Use as directed daily 1 each 0   Cholecalciferol (THERA-D 2000) 50 MCG (2000 UT) TABS 1 tab by mouth once daily 30 tablet 99   cyclobenzaprine (FLEXERIL) 5 MG tablet Take 1 tablet (5 mg total) by mouth 3 (three) times daily as needed for muscle spasms. 30 tablet 1   dapagliflozin propanediol (FARXIGA) 10 MG TABS tablet Take 1 tablet (10 mg total) by mouth daily before breakfast. 90 tablet 3   Diclofenac Sodium (PENNSAID) 2 % SOLN Place 2 g onto the skin 2 (two) times daily. 112 g 3   glucose blood (ONE TOUCH ULTRA TEST) test strip Use as instructed once daily. Diagnosis 250.0 100 each 12   guaiFENesin-codeine (CHERATUSSIN AC) 100-10 MG/5ML syrup Take  5 mLs by mouth 3 (three) times daily as needed for cough. 180 mL 0   Lancets MISC 1 application by Does not apply route daily. 100 each 12   lisinopril (ZESTRIL) 40 MG tablet Take 1 tablet (40 mg total) by mouth daily. 90 tablet 3   metFORMIN (GLUCOPHAGE) 500 MG tablet TAKE 2 TABLETS BY MOUTH TWICE DAILY WITH A MEAL 360 tablet 0   rosuvastatin (CRESTOR) 40 MG tablet Take 1 tablet by mouth once daily 90 tablet 0   tadalafil (CIALIS) 20 MG tablet Take 1 tablet (20 mg total) by mouth daily as needed for erectile dysfunction. 10 tablet 11   triamcinolone (NASACORT) 55 MCG/ACT AERO nasal inhaler Place 2 sprays into the nose daily. 1 Inhaler 12   No current facility-administered medications on file prior to visit.        ROS:  All others reviewed and  negative.  Objective        PE:  BP (!) 160/100 (BP Location: Left Arm, Patient Position: Sitting, Cuff Size: Large)   Pulse 73   Temp 98.3 F (36.8 C) (Oral)   Ht 6' 2"  (1.88 m)   Wt 248 lb (112.5 kg)   SpO2 98%   BMI 31.84 kg/m                 Constitutional: Pt appears in NAD               HENT: Head: NCAT.                Right Ear: External ear normal.                 Left Ear: External ear normal.                Eyes: . Pupils are equal, round, and reactive to light. Conjunctivae and EOM are normal               Nose: without d/c or deformity               Neck: Neck supple. Gross normal ROM               Cardiovascular: Normal rate and regular rhythm.                 Pulmonary/Chest: Effort normal and breath sounds without rales or wheezing.                Abd:  Soft, NT, ND, + BS, no organomegaly               Neurological: Pt is alert. At baseline orientation, motor grossly intact               Skin: Skin is warm. No rashes, no other new lesions, LE edema - none               Left groin area without swelling, tender, mass or hernia, ambulation normal               Psychiatric: Pt behavior is normal without agitation   Micro: none  Cardiac tracings I have personally interpreted today:  none  Pertinent Radiological findings (summarize): none   Lab Results  Component Value Date   WBC 3.2 (L) 01/18/2021   HGB 13.3 01/18/2021   HCT 40.5 01/18/2021   PLT 204.0 01/18/2021   GLUCOSE 161 (H) 01/18/2021   CHOL 133 01/18/2021   TRIG 60.0 01/18/2021   HDL 51.10 01/18/2021  LDLDIRECT 127.5 04/02/2007   LDLCALC 70 01/18/2021   ALT 21 01/18/2021   AST 20 01/18/2021   NA 141 01/18/2021   K 4.0 01/18/2021   CL 105 01/18/2021   CREATININE 1.01 01/18/2021   BUN 16 01/18/2021   CO2 29 01/18/2021   TSH 1.11 01/18/2021   PSA 2.12 01/18/2021   HGBA1C 8.5 (H) 01/18/2021   MICROALBUR 2.1 (H) 01/18/2021   Assessment/Plan:  Howard [name] is a 60 y.o. Black or African  American [2] male with  has a past medical history of Allergic rhinitis, cause unspecified (01/15/2012), Allergy, Cholelithiasis (01/20/2018), Diabetes mellitus, DIABETES MELLITUS, TYPE II (05/24/2007), HYPERLIPIDEMIA (10/06/2007), HYPERTENSION (02/06/2008), Lumbar disc disease, PSA, INCREASED (05/11/2009), and Vitamin D deficiency (01/26/2020).  Left groin pain Exam c/w msk strain likely improving by hx, for advil prn,  to f/u any worsening symptoms or concerns  Essential hypertension BP Readings from Last 3 Encounters:  07/05/21 (!) 160/94  07/05/21 (!) 160/100  02/27/21 120/82   Uncontrolled today likely reactive,, pt to continue medical treatment lisinopril as is, as declines change   Diabetes ([place]) Lab Results  Component Value Date   HGBA1C 8.5 (H) 01/18/2021   Chronic persistent mod uncontrolled, pt to continue current medical treatment farxiga, metformin as declines change today, encouraged to f/u with endo but not sure if pt will comply  Followup: Return in about 6 months (around 01/02/2022).  Cathlean Cower, MD 07/13/2021 5:28 PM [facility]

## 2021-07-13 ENCOUNTER — Encounter: Payer: Self-pay | Admitting: Internal Medicine

## 2021-07-13 DIAGNOSIS — R1032 Left lower quadrant pain: Secondary | ICD-10-CM | POA: Insufficient documentation

## 2021-07-13 NOTE — Assessment & Plan Note (Addendum)
Lab Results  Component Value Date   HGBA1C 8.5 (H) 01/18/2021   Chronic persistent mod uncontrolled, pt to continue current medical treatment farxiga, metformin as declines change today, encouraged to f/u with endo but not sure if pt will comply

## 2021-07-13 NOTE — Assessment & Plan Note (Addendum)
BP Readings from Last 3 Encounters:  07/05/21 (!) 160/94  07/05/21 (!) 160/100  02/27/21 120/82   Uncontrolled today likely reactive,, pt to continue medical treatment lisinopril as is, as declines change

## 2021-07-13 NOTE — Assessment & Plan Note (Signed)
Exam c/w msk strain likely improving by hx, for advil prn,  to f/u any worsening symptoms or concerns

## 2021-07-17 ENCOUNTER — Ambulatory Visit (INDEPENDENT_AMBULATORY_CARE_PROVIDER_SITE_OTHER)
Admission: RE | Admit: 2021-07-17 | Discharge: 2021-07-17 | Disposition: A | Discharge status: Home / Self Care | Payer: Self-pay | Source: Ambulatory Visit | Attending: Internal Medicine | Admitting: Internal Medicine

## 2021-07-17 ENCOUNTER — Encounter: Payer: Self-pay | Admitting: Internal Medicine

## 2021-07-17 ENCOUNTER — Other Ambulatory Visit: Payer: Self-pay

## 2021-07-17 DIAGNOSIS — E1165 Type 2 diabetes mellitus with hyperglycemia: Secondary | ICD-10-CM

## 2021-07-17 DIAGNOSIS — E785 Hyperlipidemia, unspecified: Secondary | ICD-10-CM

## 2021-07-17 DIAGNOSIS — I10 Essential (primary) hypertension: Secondary | ICD-10-CM

## 2021-07-31 ENCOUNTER — Ambulatory Visit: Payer: 59 | Admitting: Internal Medicine

## 2021-08-06 ENCOUNTER — Other Ambulatory Visit (INDEPENDENT_AMBULATORY_CARE_PROVIDER_SITE_OTHER): Payer: 59

## 2021-08-06 DIAGNOSIS — E559 Vitamin D deficiency, unspecified: Secondary | ICD-10-CM | POA: Diagnosis not present

## 2021-08-06 DIAGNOSIS — E1165 Type 2 diabetes mellitus with hyperglycemia: Secondary | ICD-10-CM | POA: Diagnosis not present

## 2021-08-06 LAB — LIPID PANEL
Cholesterol: 117 mg/dL (ref 0–200)
HDL: 41.6 mg/dL (ref >39.00)
LDL Cholesterol: 56 mg/dL (ref 0–99)
NonHDL: 75.48
Total CHOL/HDL Ratio: 3
Triglycerides: 97 mg/dL (ref 0.0–149.0)
VLDL: 19.4 mg/dL (ref 0.0–40.0)

## 2021-08-06 LAB — HEPATIC FUNCTION PANEL
ALT: 19 U/L (ref 0–53)
AST: 15 U/L (ref 0–37)
Albumin: 4.3 g/dL (ref 3.5–5.2)
Alkaline Phosphatase: 88 U/L (ref 39–117)
Bilirubin, Direct: 0.1 mg/dL (ref 0.0–0.3)
Total Bilirubin: 0.4 mg/dL (ref 0.2–1.2)
Total Protein: 7.3 g/dL (ref 6.0–8.3)

## 2021-08-06 LAB — BASIC METABOLIC PANEL
BUN: 11 mg/dL (ref 6–23)
CO2: 29 mEq/L (ref 19–32)
Calcium: 9.7 mg/dL (ref 8.4–10.5)
Chloride: 101 mEq/L (ref 96–112)
Creatinine, Ser: 0.91 mg/dL (ref 0.40–1.50)
GFR: 92.33 mL/min (ref >60.00)
Glucose, Bld: 170 mg/dL — ABNORMAL HIGH (ref 70–99)
Potassium: 4.2 mEq/L (ref 3.5–5.1)
Sodium: 137 mEq/L (ref 135–145)

## 2021-08-06 LAB — VITAMIN D 25 HYDROXY (VIT D DEFICIENCY, FRACTURES): VITD: 24.09 ng/mL — ABNORMAL LOW (ref 30.00–100.00)

## 2021-08-06 LAB — HEMOGLOBIN A1C: Hgb A1c MFr Bld: 8.9 % — ABNORMAL HIGH (ref 4.6–6.5)

## 2021-08-08 ENCOUNTER — Encounter: Payer: Self-pay | Admitting: Internal Medicine

## 2021-08-08 ENCOUNTER — Ambulatory Visit: Payer: 59 | Admitting: Internal Medicine

## 2021-08-08 ENCOUNTER — Other Ambulatory Visit: Payer: Self-pay

## 2021-08-08 VITALS — BP 120/88 | HR 73 | Ht 74.0 in | Wt 246.0 lb

## 2021-08-08 DIAGNOSIS — E1165 Type 2 diabetes mellitus with hyperglycemia: Secondary | ICD-10-CM

## 2021-08-08 DIAGNOSIS — E559 Vitamin D deficiency, unspecified: Secondary | ICD-10-CM

## 2021-08-08 DIAGNOSIS — Z0000 Encounter for general adult medical examination without abnormal findings: Secondary | ICD-10-CM

## 2021-08-08 DIAGNOSIS — I10 Essential (primary) hypertension: Secondary | ICD-10-CM

## 2021-08-08 DIAGNOSIS — E7800 Pure hypercholesterolemia, unspecified: Secondary | ICD-10-CM | POA: Diagnosis not present

## 2021-08-08 DIAGNOSIS — E538 Deficiency of other specified B group vitamins: Secondary | ICD-10-CM

## 2021-08-08 MED ORDER — OZEMPIC (0.25 OR 0.5 MG/DOSE) 2 MG/1.5ML ~~LOC~~ SOPN: 0.5000 mg | PEN_INJECTOR | SUBCUTANEOUS | 3 refills | Status: DC

## 2021-08-08 MED ORDER — METFORMIN HCL ER 500 MG PO TB24
ORAL_TABLET | ORAL | 3 refills | Status: DC
Start: 2021-08-08 — End: 2022-02-07

## 2021-08-08 MED ORDER — THERA-D 2000 50 MCG (2000 UT) PO TABS: ORAL_TABLET | ORAL | 99 refills | Status: DC

## 2021-08-08 NOTE — Progress Notes (Signed)
Patient ID: Howard [name], male   DOB: 03/21/1961, 60 y.o.   MRN: [id number]        Chief Complaint: follow up dm, low vit d, hld, htn       HPI:  Howard [name] is a 60 y.o. male here overall doing ok, Pt denies chest pain, increased sob or doe, wheezing, orthopnea, PND, increased LE swelling, palpitations, dizziness or syncope.   Pt denies polydipsia, polyuria, or new focal neuro s/s.   Pt denies fever, wt loss, night sweats, loss of appetite, or other constitutional symptoms    Currently home for 2 wks after right knee arthroscopy.  Not taking Vit D.  Sometimes forgets to take the pm dosing of his metformin.  Difficult to lose wt, especially post knee surgury, interested in ozempic.  No other new complaints  Wt Readings from Last 3 Encounters:  08/08/21 246 lb (111.6 kg)  07/05/21 248 lb (112.5 kg)  07/05/21 248 lb (112.5 kg)   BP Readings from Last 3 Encounters:  08/08/21 120/88  07/05/21 (!) 160/94  07/05/21 (!) 160/100         Past Medical History:  Diagnosis Date   Allergic rhinitis, cause unspecified 01/15/2012   Allergy    Cholelithiasis 01/20/2018   Diabetes mellitus    DIABETES MELLITUS, TYPE II 05/24/2007   Qualifier: Diagnosis of  By: Dance CMA (AAMA), Thurmon Fair 10/06/2007   Qualifier: Diagnosis of  By: Alain Marion MD, Evie Lacks    HYPERTENSION 02/06/2008   Qualifier: Diagnosis of  By: Alain Marion MD, Aleksei V    Lumbar disc disease    s/p surgury approx 1992   PSA, INCREASED 05/11/2009   Qualifier: Diagnosis of  By: Ronnald Ramp MD, Arvid Right.    Vitamin D deficiency 01/26/2020   Past Surgical History:  Procedure Laterality Date   BACK SURGERY  1989   Lumbar/Ruptured Disk   HERNIA REPAIR  may 2012   left inguinal   KNEE ARTHROSCOPY Right 2014   NASAL SEPTUM SURGERY      reports that he has never smoked. He has never used smokeless tobacco. He reports current alcohol use. He reports that he does not use drugs. family history includes Cancer in his mother;  Diabetes in his father and mother; Hypertension in his father and mother. No Known Allergies Current Outpatient Medications on File Prior to Visit  Medication Sig Dispense Refill   aspirin 81 MG EC tablet Take 1 tablet (81 mg total) by mouth daily. Swallow whole. 30 tablet 12   Blood Glucose Monitoring Suppl (ONE TOUCH ULTRA 2) W/DEVICE KIT Use as directed daily 1 each 0   cyclobenzaprine (FLEXERIL) 5 MG tablet Take 1 tablet (5 mg total) by mouth 3 (three) times daily as needed for muscle spasms. 30 tablet 1   Diclofenac Sodium (PENNSAID) 2 % SOLN Place 2 g onto the skin 2 (two) times daily. 112 g 3   glucose blood (ONE TOUCH ULTRA TEST) test strip Use as instructed once daily. Diagnosis 250.0 100 each 12   guaiFENesin-codeine (CHERATUSSIN AC) 100-10 MG/5ML syrup Take 5 mLs by mouth 3 (three) times daily as needed for cough. 180 mL 0   Lancets MISC 1 application by Does not apply route daily. 100 each 12   lisinopril (ZESTRIL) 40 MG tablet Take 1 tablet (40 mg total) by mouth daily. 90 tablet 3   meloxicam (MOBIC) 15 MG tablet Take 1 tablet (15 mg total) by mouth daily. 30 tablet 0  rosuvastatin (CRESTOR) 40 MG tablet Take 1 tablet by mouth once daily 90 tablet 0   tadalafil (CIALIS) 20 MG tablet Take 1 tablet (20 mg total) by mouth daily as needed for erectile dysfunction. 10 tablet 11   triamcinolone (NASACORT) 55 MCG/ACT AERO nasal inhaler Place 2 sprays into the nose daily. 1 Inhaler 12   No current facility-administered medications on file prior to visit.        ROS:  All others reviewed and negative.  Objective        PE:  BP 120/88 (BP Location: Right Arm, Patient Position: Sitting, Cuff Size: Large)   Pulse 73   Ht _0  (1.88 m)   Wt 246 lb (111.6 kg)   SpO2 98%   BMI 31.58 kg/m                 Constitutional: Pt appears in NAD               HENT: Head: NCAT.                Right Ear: External ear normal.                 Left Ear: External ear normal.                Eyes:  . Pupils are equal, round, and reactive to light. Conjunctivae and EOM are normal               Nose: without d/c or deformity               Neck: Neck supple. Gross normal ROM               Cardiovascular: Normal rate and regular rhythm.                 Pulmonary/Chest: Effort normal and breath sounds without rales or wheezing.                Abd:  Soft, NT, ND, + BS, no organomegaly               Neurological: Pt is alert. At baseline orientation, motor grossly intact               Skin: Skin is warm. No rashes, no other new lesions, LE edema - none               Psychiatric: Pt behavior is normal without agitation   Micro: none  Cardiac tracings I have personally interpreted today:  none  Pertinent Radiological findings (summarize): none   Lab Results  Component Value Date   WBC 3.2 (L) 01/18/2021   HGB 13.3 01/18/2021   HCT 40.5 01/18/2021   PLT 204.0 01/18/2021   GLUCOSE 170 (H) 08/06/2021   CHOL 117 08/06/2021   TRIG 97.0 08/06/2021   HDL 41.60 08/06/2021   LDLDIRECT 127.5 04/02/2007   LDLCALC 56 08/06/2021   ALT 19 08/06/2021   AST 15 08/06/2021   NA 137 08/06/2021   K 4.2 08/06/2021   CL 101 08/06/2021   CREATININE 0.91 08/06/2021   BUN 11 08/06/2021   CO2 29 08/06/2021   TSH 1.11 01/18/2021   PSA 2.12 01/18/2021   HGBA1C 8.9 (H) 08/06/2021   MICROALBUR 2.1 (H) 01/18/2021   Assessment/Plan:  Howard [name] is a 60 y.o. Black or African American [2] male with  has a past medical history of Allergic rhinitis, cause unspecified (01/15/2012), Allergy, Cholelithiasis (01/20/2018),  Diabetes mellitus, DIABETES MELLITUS, TYPE II (05/24/2007), HYPERLIPIDEMIA (10/06/2007), HYPERTENSION (02/06/2008), Lumbar disc disease, PSA, INCREASED (05/11/2009), and Vitamin D deficiency (01/26/2020).  Diabetes ([place]) Lab Results  Component Value Date   HGBA1C 8.9 (H) 08/06/2021   uncontrolled pt to change current medical treatment to metformin ER 500 - 4 in the am, and add low dose  ozempic   Hyperlipidemia Lab Results  Component Value Date   LDLCALC 56 08/06/2021   Stable, pt to continue current statin crestor 40  Essential hypertension BP Readings from Last 3 Encounters:  08/08/21 120/88  07/05/21 (!) 160/94  07/05/21 (!) 160/100   Stable, pt to continue medical treatment lisinopril   Vitamin D deficiency Last vitamin D Lab Results  Component Value Date   VD25OH 24.09 (L) 08/06/2021   Low, to start oral replacement  Followup: Return in about 6 months (around 02/06/2022).  Cathlean Cower, MD 08/11/2021 4:13 PM [facility]

## 2021-08-08 NOTE — Patient Instructions (Signed)
Ok to change the metformin to the ER 500 mg - 4 tabs in the AM  Please take all new medication as prescribed - the ozempic  Please continue all other medications as before, and refills have been done if requested.  Please have the pharmacy call with any other refills you may need.  Please continue your efforts at being more active, low cholesterol diet, and weight control.  Please keep your appointments with your specialists as you may have planned  Please make an Appointment to return in 6 months, or sooner if needed, also with Lab Appointment for testing done 3-5 days before at the [facility] (so this is for TWO appointments - please see the scheduling desk as you leave)  Due to the ongoing Covid 19 pandemic, our lab now requires an appointment for any labs done at our office.  If you need labs done and do not have an appointment, please call our office ahead of time to schedule before presenting to the lab for your testing.

## 2021-08-11 ENCOUNTER — Encounter: Payer: Self-pay | Admitting: Internal Medicine

## 2021-08-11 NOTE — Assessment & Plan Note (Signed)
Lab Results  Component Value Date   LDLCALC 56 08/06/2021   Stable, pt to continue current statin crestor 40

## 2021-08-11 NOTE — Assessment & Plan Note (Signed)
Last vitamin D Lab Results  Component Value Date   VD25OH 24.09 (L) 08/06/2021   Low, to start oral replacement

## 2021-08-11 NOTE — Assessment & Plan Note (Signed)
Lab Results  Component Value Date   HGBA1C 8.9 (H) 08/06/2021   uncontrolled pt to change current medical treatment to metformin ER 500 - 4 in the am, and add low dose ozempic

## 2021-08-11 NOTE — Assessment & Plan Note (Signed)
BP Readings from Last 3 Encounters:  08/08/21 120/88  07/05/21 (!) 160/94  07/05/21 (!) 160/100   Stable, pt to continue medical treatment lisinopril

## 2021-09-21 ENCOUNTER — Other Ambulatory Visit: Payer: Self-pay | Admitting: Internal Medicine

## 2021-09-22 NOTE — Telephone Encounter (Signed)
Please refill as per office routine med refill policy (all routine meds to be refilled for 3 mo or monthly (per pt preference) up to one year from last visit, then month to month grace period for 3 mo, then further med refills will have to be denied) ? ?

## 2021-11-06 ENCOUNTER — Encounter: Payer: Self-pay | Admitting: Internal Medicine

## 2022-01-07 LAB — HM DIABETES EYE EXAM

## 2022-02-06 ENCOUNTER — Other Ambulatory Visit (INDEPENDENT_AMBULATORY_CARE_PROVIDER_SITE_OTHER): Payer: 59

## 2022-02-06 DIAGNOSIS — E538 Deficiency of other specified B group vitamins: Secondary | ICD-10-CM | POA: Diagnosis not present

## 2022-02-06 DIAGNOSIS — Z0000 Encounter for general adult medical examination without abnormal findings: Secondary | ICD-10-CM

## 2022-02-06 DIAGNOSIS — E1165 Type 2 diabetes mellitus with hyperglycemia: Secondary | ICD-10-CM

## 2022-02-06 DIAGNOSIS — E559 Vitamin D deficiency, unspecified: Secondary | ICD-10-CM | POA: Diagnosis not present

## 2022-02-06 LAB — CBC WITH DIFFERENTIAL/PLATELET
Basophils Absolute: 0 10*3/uL (ref 0.0–0.1)
Basophils Relative: 1.1 % (ref 0.0–3.0)
Eosinophils Absolute: 0.1 10*3/uL (ref 0.0–0.7)
Eosinophils Relative: 2.2 % (ref 0.0–5.0)
HCT: 41.1 % (ref 39.0–52.0)
Hemoglobin: 13.3 g/dL (ref 13.0–17.0)
Lymphocytes Relative: 26 % (ref 12.0–46.0)
Lymphs Abs: 1 10*3/uL (ref 0.7–4.0)
MCHC: 32.3 g/dL (ref 30.0–36.0)
MCV: 83.2 fl (ref 78.0–100.0)
Monocytes Absolute: 0.5 10*3/uL (ref 0.1–1.0)
Monocytes Relative: 12.1 % — ABNORMAL HIGH (ref 3.0–12.0)
Neutro Abs: 2.2 10*3/uL (ref 1.4–7.7)
Neutrophils Relative %: 58.6 % (ref 43.0–77.0)
Platelets: 182 10*3/uL (ref 150.0–400.0)
RBC: 4.93 Mil/uL (ref 4.22–5.81)
RDW: 15.1 % (ref 11.5–15.5)
WBC: 3.8 10*3/uL — ABNORMAL LOW (ref 4.0–10.5)

## 2022-02-06 LAB — HEPATIC FUNCTION PANEL
ALT: 17 U/L (ref 0–53)
AST: 20 U/L (ref 0–37)
Albumin: 4.3 g/dL (ref 3.5–5.2)
Alkaline Phosphatase: 84 U/L (ref 39–117)
Bilirubin, Direct: 0.1 mg/dL (ref 0.0–0.3)
Total Bilirubin: 0.5 mg/dL (ref 0.2–1.2)
Total Protein: 6.7 g/dL (ref 6.0–8.3)

## 2022-02-06 LAB — URINALYSIS, ROUTINE W REFLEX MICROSCOPIC
Bilirubin Urine: NEGATIVE
Hgb urine dipstick: NEGATIVE
Ketones, ur: NEGATIVE
Leukocytes,Ua: NEGATIVE
Nitrite: NEGATIVE
RBC / HPF: NONE SEEN (ref 0–?)
Specific Gravity, Urine: >=1.03 — AB (ref 1.000–1.030)
Total Protein, Urine: NEGATIVE
Urine Glucose: NEGATIVE
Urobilinogen, UA: 0.2 (ref 0.0–1.0)
pH: 6 (ref 5.0–8.0)

## 2022-02-06 LAB — LIPID PANEL
Cholesterol: 127 mg/dL (ref 0–200)
HDL: 46.8 mg/dL (ref >39.00)
LDL Cholesterol: 72 mg/dL (ref 0–99)
NonHDL: 80.45
Total CHOL/HDL Ratio: 3
Triglycerides: 44 mg/dL (ref 0.0–149.0)
VLDL: 8.8 mg/dL (ref 0.0–40.0)

## 2022-02-06 LAB — BASIC METABOLIC PANEL
BUN: 12 mg/dL (ref 6–23)
CO2: 28 mEq/L (ref 19–32)
Calcium: 9.3 mg/dL (ref 8.4–10.5)
Chloride: 105 mEq/L (ref 96–112)
Creatinine, Ser: 0.92 mg/dL (ref 0.40–1.50)
GFR: 90.81 mL/min (ref >60.00)
Glucose, Bld: 161 mg/dL — ABNORMAL HIGH (ref 70–99)
Potassium: 3.8 mEq/L (ref 3.5–5.1)
Sodium: 139 mEq/L (ref 135–145)

## 2022-02-06 LAB — MICROALBUMIN / CREATININE URINE RATIO
Creatinine,U: 240.1 mg/dL
Microalb Creat Ratio: 0.9 mg/g (ref 0.0–30.0)
Microalb, Ur: 2.1 mg/dL — ABNORMAL HIGH (ref 0.0–1.9)

## 2022-02-06 LAB — PSA: PSA: 1.93 ng/mL (ref 0.10–4.00)

## 2022-02-06 LAB — TSH: TSH: 1.15 u[IU]/mL (ref 0.35–5.50)

## 2022-02-06 LAB — VITAMIN D 25 HYDROXY (VIT D DEFICIENCY, FRACTURES): VITD: 17.79 ng/mL — ABNORMAL LOW (ref 30.00–100.00)

## 2022-02-06 LAB — VITAMIN B12: Vitamin B-12: 372 pg/mL (ref 211–911)

## 2022-02-06 LAB — HEMOGLOBIN A1C: Hgb A1c MFr Bld: 8.6 % — ABNORMAL HIGH (ref 4.6–6.5)

## 2022-02-07 ENCOUNTER — Other Ambulatory Visit: Payer: Self-pay | Admitting: Internal Medicine

## 2022-02-07 ENCOUNTER — Ambulatory Visit (INDEPENDENT_AMBULATORY_CARE_PROVIDER_SITE_OTHER): Payer: 59

## 2022-02-07 ENCOUNTER — Ambulatory Visit: Payer: 59 | Admitting: Internal Medicine

## 2022-02-07 ENCOUNTER — Encounter: Payer: Self-pay | Admitting: Internal Medicine

## 2022-02-07 VITALS — BP 136/78 | HR 65 | Temp 98.3°F | Ht 74.0 in | Wt 243.0 lb

## 2022-02-07 DIAGNOSIS — M25551 Pain in right hip: Secondary | ICD-10-CM

## 2022-02-07 DIAGNOSIS — Z0001 Encounter for general adult medical examination with abnormal findings: Secondary | ICD-10-CM

## 2022-02-07 DIAGNOSIS — M25552 Pain in left hip: Secondary | ICD-10-CM

## 2022-02-07 DIAGNOSIS — E119 Type 2 diabetes mellitus without complications: Secondary | ICD-10-CM

## 2022-02-07 DIAGNOSIS — I10 Essential (primary) hypertension: Secondary | ICD-10-CM

## 2022-02-07 DIAGNOSIS — E1165 Type 2 diabetes mellitus with hyperglycemia: Secondary | ICD-10-CM

## 2022-02-07 DIAGNOSIS — E559 Vitamin D deficiency, unspecified: Secondary | ICD-10-CM | POA: Diagnosis not present

## 2022-02-07 DIAGNOSIS — E7800 Pure hypercholesterolemia, unspecified: Secondary | ICD-10-CM

## 2022-02-07 MED ORDER — METFORMIN HCL ER 500 MG PO TB24: ORAL_TABLET | ORAL | 3 refills | Status: DC

## 2022-02-07 MED ORDER — LISINOPRIL 40 MG PO TABS: 40.0000 mg | ORAL_TABLET | Freq: Every day | ORAL | 3 refills | Status: DC

## 2022-02-07 MED ORDER — MELOXICAM 15 MG PO TABS: 15.0000 mg | ORAL_TABLET | Freq: Every day | ORAL | 3 refills | Status: AC | PRN

## 2022-02-07 MED ORDER — LANCETS MISC: 1.0000 "application " | Freq: Every day | 12 refills | Status: AC

## 2022-02-07 MED ORDER — ASPIRIN 81 MG PO TBEC: 81.0000 mg | DELAYED_RELEASE_TABLET | Freq: Every day | ORAL | 12 refills | Status: DC

## 2022-02-07 MED ORDER — OZEMPIC (0.25 OR 0.5 MG/DOSE) 2 MG/1.5ML ~~LOC~~ SOPN: 0.5000 mg | PEN_INJECTOR | SUBCUTANEOUS | 3 refills | Status: DC

## 2022-02-07 MED ORDER — GLUCOSE BLOOD VI STRP: ORAL_STRIP | 12 refills | Status: AC

## 2022-02-07 NOTE — Progress Notes (Signed)
Patient ID: Howard [name], male   DOB: [birth year], 61 y.o.   MRN: [id number] ? ? ?     Chief Complaint:: wellness exam and Annual Exam (Patient c/o having pain in hips/groin x3-4 months) ? , dm, low vit d, hld, htn ? ?     HPI:  Howard [name] is a 61 y.o. male here for wellness exam; decliens covid booster, shingrix, o/w up to date ?         ?              Also Pt denies chest pain, increased sob or doe, wheezing, orthopnea, PND, increased LE swelling, palpitations, dizziness or syncope.   Pt denies polydipsia, polyuria, or new focal neuro s/s.    Pt denies fever, wt loss, night sweats, loss of appetite, or other constitutional symptoms  does also have 2 mo worsening bilateral hip pain moderate to occasionally severe, intermittent, worse with standing up and walking, better to sit. Nothing else makes better or worse. Never started ozempic per last visit.   ?  ?Wt Readings from Last 3 Encounters:  ?02/07/22 243 lb (110.2 kg)  ?08/08/21 246 lb (111.6 kg)  ?07/05/21 248 lb (112.5 kg)  ? ?BP Readings from Last 3 Encounters:  ?02/07/22 136/78  ?08/08/21 120/88  ?07/05/21 (!) 160/94  ? ?Immunization History  ?Administered Date(s) Administered  ? PFIZER(Purple Top)SARS-COV-2 Vaccination 01/11/2020, 02/01/2020, 08/13/2020, 06/21/2021  ?There are no preventive care reminders to display for this patient. ?  ? ?Past Medical History:  ?Diagnosis Date  ? Allergic rhinitis, cause unspecified 01/15/2012  ? Allergy   ? Cholelithiasis 01/20/2018  ? Diabetes mellitus   ? DIABETES MELLITUS, TYPE II 05/24/2007  ? Qualifier: Diagnosis of  By: Dance CMA ([place]), Kim    ? HYPERLIPIDEMIA 10/06/2007  ? Qualifier: Diagnosis of  By: Plotnikov MD, [place] HYPERTENSION 02/06/2008  ? Qualifier: Diagnosis of  By: Plotnikov MD, Evie Lacks   ? Lumbar disc disease   ? s/p surgury approx 1992  ? PSA, INCREASED 05/11/2009  ? Qualifier: Diagnosis of  By: Ronnald Ramp MD, Arvid Right.   ? Vitamin D deficiency 01/26/2020  ? ?Past Surgical History:  ?Procedure  Laterality Date  ? [place]  ? Lumbar/Ruptured Disk  ? HERNIA REPAIR  may 2012  ? left inguinal  ? KNEE ARTHROSCOPY Right 2014  ? NASAL SEPTUM SURGERY    ? ? reports that he has never smoked. He has never used smokeless tobacco. He reports current alcohol use. He reports that he does not use drugs. ?family history includes Cancer in his mother; Diabetes in his father and mother; Hypertension in his father and mother. ?No Known Allergies ?Current Outpatient Medications on File Prior to Visit  ?Medication Sig Dispense Refill  ? Blood Glucose Monitoring Suppl (ONE TOUCH ULTRA 2) W/DEVICE KIT Use as directed daily 1 each 0  ? rosuvastatin (CRESTOR) 40 MG tablet Take 1 tablet by mouth once daily 90 tablet 0  ? ?No current facility-administered medications on file prior to visit.  ? ?     ROS:  All others reviewed and negative. ? ?Objective  ? ?     PE:  BP 136/78 (BP Location: Left Arm, Patient Position: Sitting, Cuff Size: Large)   Pulse 65   Temp 98.3 ?F (36.8 ?C) (Oral)   Ht _0  (1.88 m)   Wt 243 lb (110.2 kg)   SpO2 97%   BMI 31.20 kg/m?  ? ?  Constitutional: Pt appears in NAD ?              HENT: Head: NCAT.  ?              Right Ear: External ear normal.   ?              Left Ear: External ear normal.  ?              Eyes: . Pupils are equal, round, and reactive to light. Conjunctivae and EOM are normal ?              Nose: without d/c or deformity ?              Neck: Neck supple. Gross normal ROM ?              Cardiovascular: Normal rate and regular rhythm.   ?              Pulmonary/Chest: Effort normal and breath sounds without rales or wheezing.  ?              Abd:  Soft, NT, ND, + BS, no organomegaly; has pain and reduced ROM to both hips right > left ?              Neurological: Pt is alert. At baseline orientation, motor grossly intact ?              Skin: Skin is warm. No rashes, no other new lesions, LE edema - none ?              Psychiatric: Pt behavior is normal without  agitation  ? ?Micro: none ? ?Cardiac tracings I have personally interpreted today:  none ? ?Pertinent Radiological findings (summarize): none  ? ?Lab Results  ?Component Value Date  ? WBC 3.8 (L) 02/06/2022  ? HGB 13.3 02/06/2022  ? HCT 41.1 02/06/2022  ? PLT 182.0 02/06/2022  ? GLUCOSE 161 (H) 02/06/2022  ? CHOL 127 02/06/2022  ? TRIG 44.0 02/06/2022  ? HDL 46.80 02/06/2022  ? LDLDIRECT 127.5 04/02/2007  ? [place] 72 02/06/2022  ? ALT 17 02/06/2022  ? AST 20 02/06/2022  ? NA 139 02/06/2022  ? K 3.8 02/06/2022  ? CL 105 02/06/2022  ? CREATININE 0.92 02/06/2022  ? BUN 12 02/06/2022  ? CO2 28 02/06/2022  ? TSH 1.15 02/06/2022  ? PSA 1.93 02/06/2022  ? HGBA1C 8.6 (H) 02/06/2022  ? MICROALBUR 2.1 (H) 02/06/2022  ? ?Assessment/Plan:  ?Howard [name] is a 61 y.o. Black or African American [2] male with  has a past medical history of Allergic rhinitis, cause unspecified (01/15/2012), Allergy, Cholelithiasis (01/20/2018), Diabetes mellitus, DIABETES MELLITUS, TYPE II (05/24/2007), HYPERLIPIDEMIA (10/06/2007), HYPERTENSION (02/06/2008), Lumbar disc disease, PSA, INCREASED (05/11/2009), and Vitamin D deficiency (01/26/2020). ? ?Encounter for well adult exam with abnormal findings ?Age and sex appropriate education and counseling updated with regular exercise and diet ?Referrals for preventative services - none needed ?Immunizations addressed - declines covid booster, shingrix ?Smoking counseling  - none needed ?Evidence for depression or other mood disorder - none significant ?Most recent labs reviewed. ?I have personally reviewed and have noted: ?1) the patient's medical and social history ?2) The patient's current medications and supplements ?3) The patient's height, weight, and BMI have been recorded in the chart ? ? ?Vitamin D deficiency ?Last vitamin D ?Lab Results  ?Component Value Date  ? VD25OH 17.79 (L) 02/06/2022  ? ?Low, to start oral  replacement ? ?Hyperlipidemia ?Lab Results  ?Component Value Date  ? [place] 72  02/06/2022  ? ?Stable pt to continue current statin crestor 40 ? ? ?Essential hypertension ?BP Readings from Last 3 Encounters:  ?02/07/22 136/78  ?08/08/21 120/88  ?07/05/21 (!) 160/94  ? ?Stable, pt to continue medical treatment lisinopril ? ? ?Diabetes ([place]) ?Lab Results  ?Component Value Date  ? HGBA1C 8.6 (H) 02/06/2022  ? ?Uncontrolled,, pt to continue current medical treatment metformin, and add ozempic for sugar and wt control ? ? ?Bilateral hip pain ?Exam c/w prob bilateral hip arthritis, for mobic prn, also xray, and f/u sports medicine ? ?Followup: Return in about 6 months (around 08/09/2022). ? ?Cathlean Cower, MD 02/11/2022 8:36 PM ?[facility] ?

## 2022-02-07 NOTE — Patient Instructions (Addendum)
Please consider the Shingels shot at the pharmacy if your insurance covers ? ?Please take all new medication as prescribed - to restart the meloxicam as needed for pain ? ?Please go to the XRAY Department in the first floor for the x-ray testing ? ?Please see DR Tamala Julian on the first floor if the hips become worse ? ?Please continue all other medications as before, and refills have been done if requested. ? ?Please have the pharmacy call with any other refills you may need. ? ?Please continue your efforts at being more active, low cholesterol diet, and weight control. ? ?You are otherwise up to date with prevention measures today. ? ?Please keep your appointments with your specialists as you may have planned ? ?You will be contacted by phone if any changes need to be made immediately.  Otherwise, you will receive a letter about your results with an explanation, but please check with MyChart first. ? ?Please remember to sign up for MyChart if you have not done so, as this will be important to you in the future with finding out test results, communicating by private email, and scheduling acute appointments online when needed. ? ?Please make an Appointment to return in 6 months, or sooner if needed, also with Lab Appointment for testing done 3-5 days before at the [facility] (so this is for TWO appointments - please see the scheduling desk as you leave) ? ?Due to the ongoing Covid 19 pandemic, our lab now requires an appointment for any labs done at our office.  If you need labs done and do not have an appointment, please call our office ahead of time to schedule before presenting to the lab for your testing. ? ? ? ? ? ? ? ? ? ?

## 2022-02-11 ENCOUNTER — Encounter: Payer: Self-pay | Admitting: Internal Medicine

## 2022-02-11 DIAGNOSIS — M25551 Pain in right hip: Secondary | ICD-10-CM | POA: Insufficient documentation

## 2022-02-11 NOTE — Assessment & Plan Note (Signed)
BP Readings from Last 3 Encounters:  ?02/07/22 136/78  ?08/08/21 120/88  ?07/05/21 (!) 160/94  ? ?Stable, pt to continue medical treatment lisinopril ? ?

## 2022-02-11 NOTE — Assessment & Plan Note (Signed)
Lab Results  ?Component Value Date  ? HGBA1C 8.6 (H) 02/06/2022  ? ?Uncontrolled,, pt to continue current medical treatment metformin, and add ozempic for sugar and wt control ? ?

## 2022-02-11 NOTE — Assessment & Plan Note (Signed)
Last vitamin D ?Lab Results  ?Component Value Date  ? VD25OH 17.79 (L) 02/06/2022  ? ?Low, to start oral replacement ?

## 2022-02-11 NOTE — Assessment & Plan Note (Signed)
Lab Results  ?Component Value Date  ? [place] 72 02/06/2022  ? ?Stable pt to continue current statin crestor 40 ? ?

## 2022-02-11 NOTE — Assessment & Plan Note (Signed)

## 2022-02-11 NOTE — Assessment & Plan Note (Signed)
Exam c/w prob bilateral hip arthritis, for mobic prn, also xray, and f/u sports medicine ?

## 2022-02-18 NOTE — Progress Notes (Signed)
? ?I, Wendy Poet, LAT, ATC, am serving as scribe for Dr. Lynne Leader. ? ?Howard [name] is a 61 y.o. male who presents to [facility] at [facility] today for f/u of L hip/groin pain.  He was last seen by Dr. Glennon Mac on 07/05/21 for L groin pain and was prescribed Meloxicam and provided a HEP.  Prior to that, pt saw Dr. Tamala Julian on 02/27/21 for R knee pain.  Today, pt reports that his L hip pain con't and is intermittent in nature w/ no change since seeing Dr. Glennon Mac in September. He locates his pain to his L ant hip/groin and denies any radiating pain.  He denies any mechanical symptoms.  Aggravating factors include prolonged sitting or walking and squatting-type movements.  He is taking meloxicam and/or IBU. ? ?Diagnostic testing: B hip XR- 02/07/22 ? ?Pertinent review of systems: no fever or chills ? ?Relevant historical information: Diabetes ? ? ?Exam:  ?BP (!) 148/90 (BP Location: Left Arm, Patient Position: Sitting, Cuff Size: Large)   Pulse 74   Ht '6\' 2"'$  (1.88 m)   Wt 244 lb 9.6 oz (110.9 kg)   SpO2 96%   BMI 31.40 kg/m?  ?General: Well Developed, well nourished, and in no acute distress.  ? ?MSK: Left hip: Normal. ?Decreased range of motion to flexion and external and internal rotation.  Pain with these motions. ? ? ? ?Lab and Radiology Results ?EXAM: ?DG HIP (WITH OR WITHOUT PELVIS) 5+V BILAT ?  ?COMPARISON:  None. ?  ?FINDINGS: ?There is diffuse decreased bone mineralization. Moderate right and ?mild left superior femoroacetabular joint space narrowing. Decreased ?offset of the bilateral anterior superior femoral head-neck ?junctions, as can be seen with CAM-type femoroacetabular ?impingement. ?  ?Mild bilateral sacroiliac inferior joint space narrowing. Mild pubic ?symphysis subchondral sclerosis. No acute fracture is seen. No ?dislocation. ?  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 ?  ?  ?Electronically Signed ?  By: Yvonne Kendall M.D. ?  On: 02/09/2022 11:34 ?  ?I, Lynne Leader, personally (independently) visualized and performed the interpretation of the images attached in this note. ? ? ? ? ?Assessment and Plan: ?61 y.o. male with left hip pain.  Pain is located anterior hip thought to be due to labrum tear or femoral acetabular impingement.  X-ray appearance of both hips have a femoral acetabular type appearance is concerning for impingement.  Spent a lot of time talking about his x-ray and his pain.  We also talked about potential future treatment plans.  Based on his response to the injection component of the MRI arthrogram and the appearance of arthrogram he may be referred to either joint sparing surgery or hip replacement surgery.  I expect it is likely that will need a hip replacement within a year.  It is a good idea for him to start planning for surgery now. ? ? ?PDMP not reviewed this encounter. ?Orders Placed This Encounter  ?Procedures  ? MR HIP LEFT W CONTRAST  ?  L hip MRI arthrogram.  No IV contrast.  Please schedule w/ Dr. Darene Lamer one hour before for injection.  ?  Standing Status:   Future  ?  Standing Expiration Date:   02/20/2023  ?  Scheduling Instructions:  ?   L hip MRI arthrogram.  No IV contrast.  Please schedule w/ Dr. Darene Lamer one hour before for injection.  ?  Order Specific Question:  Reason for Exam (SYMPTOM  OR DIAGNOSIS REQUIRED)  ?  Answer:   L hip arthritis  ?  Order Specific Question:   If indicated for the ordered procedure, I authorize the administration of contrast media per Radiology protocol  ?  Answer:   Yes  ?  Order Specific Question:   What is the patient's sedation requirement?  ?  Answer:   No Sedation  ?  Order Specific Question:   Does the patient have a pacemaker or implanted devices?  ?  Answer:   No  ?  Order Specific Question:   Preferred imaging location?  ?  Answer:   Product/process development scientist  (table limit-350lbs)  ? ?No orders of the defined types were placed in this encounter. ? ? ? ?Discussed warning signs or symptoms. Please see discharge instructions. Patient expresses understanding. ? ? ?The above documentation has been reviewed and is accurate and complete Lynne Leader, M.D. ? ? ? ?

## 2022-02-19 ENCOUNTER — Encounter: Payer: Self-pay | Admitting: Family Medicine

## 2022-02-19 ENCOUNTER — Ambulatory Visit: Payer: 59 | Admitting: Family Medicine

## 2022-02-19 VITALS — BP 148/90 | HR 74 | Ht 74.0 in | Wt 244.6 lb

## 2022-02-19 DIAGNOSIS — G8929 Other chronic pain: Secondary | ICD-10-CM | POA: Diagnosis not present

## 2022-02-19 DIAGNOSIS — M25552 Pain in left hip: Secondary | ICD-10-CM

## 2022-02-19 NOTE — Patient Instructions (Addendum)
Nice to meet you today. ? ?I've referred you for an MRI arthrogram of your L hip.  That facility will contact you to schedule but please let us know if you don't hear from them within one week regarding scheduling. ? ?Follow-up: after MRI ?

## 2022-02-22 IMAGING — MR MR KNEE*R* W/O CM
7 series · 35 of 40 positions shown · non-contrast
Comparison: X-ray 02/08/2020, ultrasound 02/08/2020

CLINICAL DATA: Medial right knee pain. History of prior medial
meniscal repair. History of cystic structure at the medial knee
which has been previously aspirated.

EXAM:
MRI OF THE RIGHT KNEE WITHOUT CONTRAST
TECHNIQUE: Multiplanar, multisequence MR imaging of the knee was performed. No
intravenous contrast was administered.

[Series 6: T2 fat-sat · axial · right · 4.0mm · 0.50mm/px · z∈[-126,+37]mm · 7 of 38 slices shown (1 of 3)]
[im 1/38]
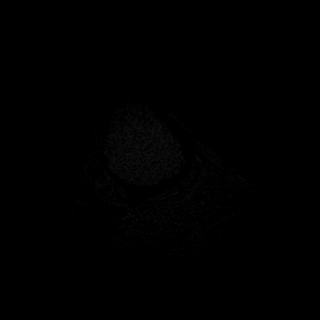
[im 7/38]
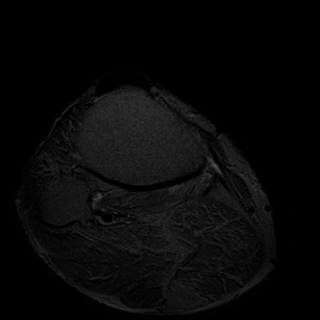
[im 13/38]
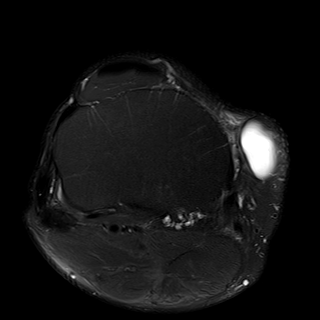
[im 19/38]
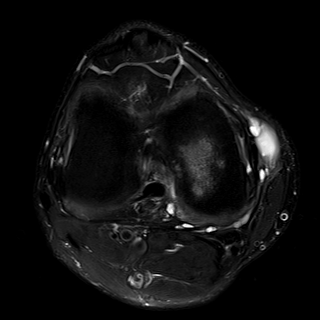
[im 25/38]
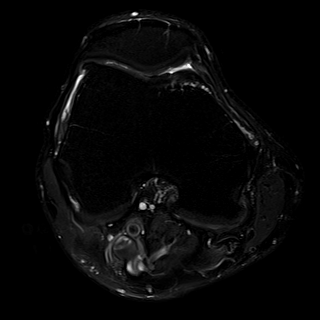
[im 31/38]
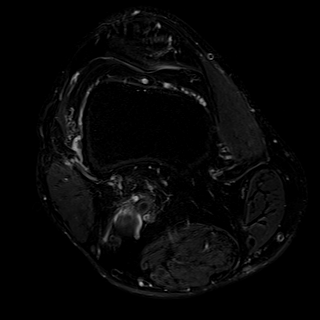
[im 38/38]
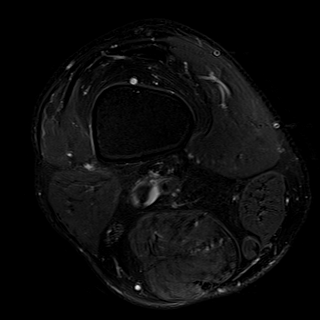

[Series 7: T2 fat-sat · coronal · right · 4.0mm · 0.39mm/px · 5 of 31 slices shown (2 of 3)]
[im 1/31]
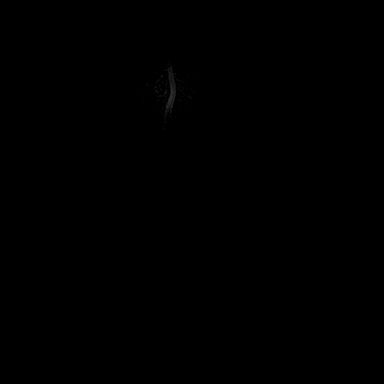
[im 8/31]
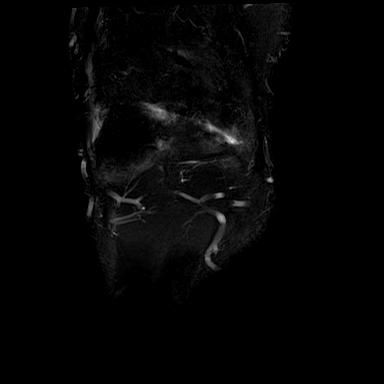
[im 16/31]
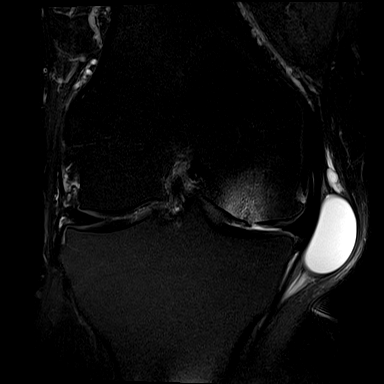
[im 23/31]
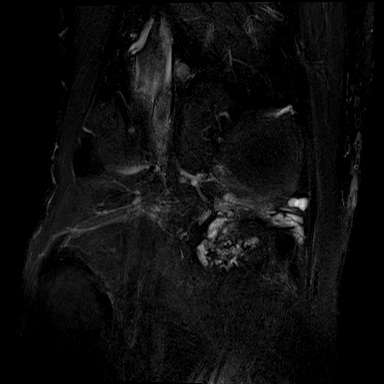
[im 31/31]
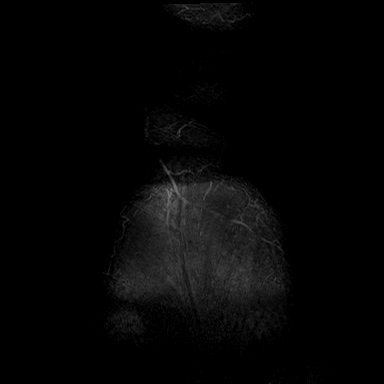

[Series 8: T1 · coronal · right · 4.0mm · 0.39mm/px · 1 of 32 slices shown]
[im 1/32]
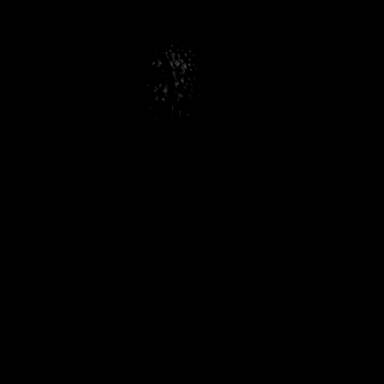

[Series 9: PD fat-sat · coronal · right · 3.0mm · 0.47mm/px · 6 of 37 slices shown (1 of 2)]
[im 1/37]
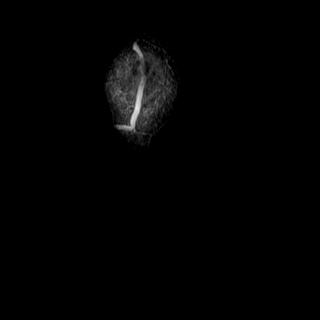
[im 8/37]
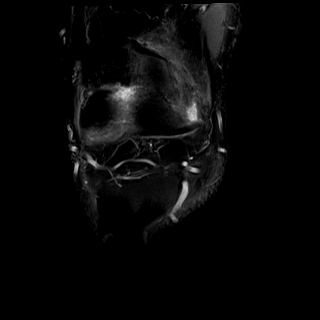
[im 15/37]
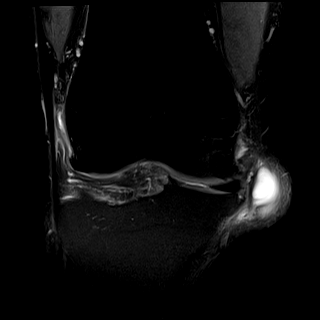
[im 22/37]
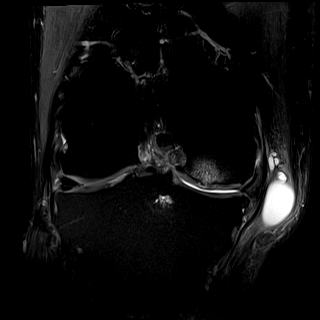
[im 29/37]
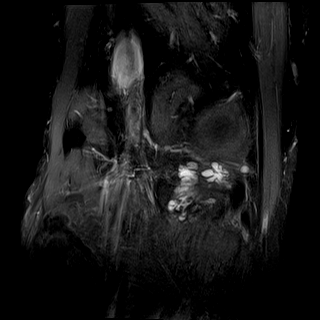
[im 37/37]
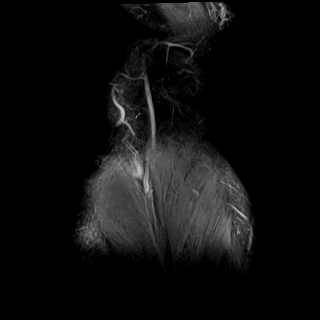

[Series 10: PD fat-sat · sagittal · right · 3.0mm · 0.39mm/px · 6 of 36 slices shown (2 of 2)]
[im 1/36]
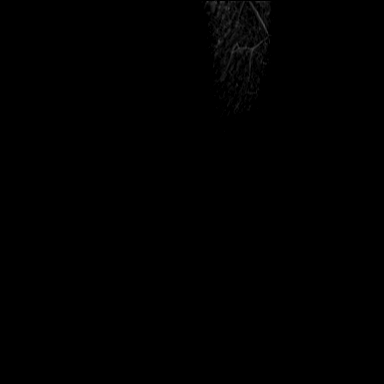
[im 8/36]
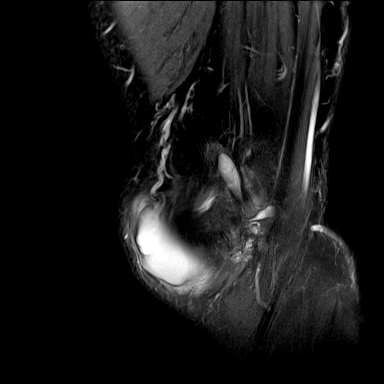
[im 15/36]
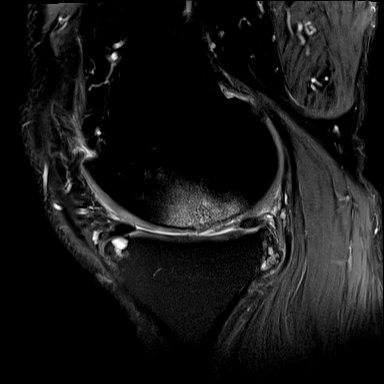
[im 22/36]
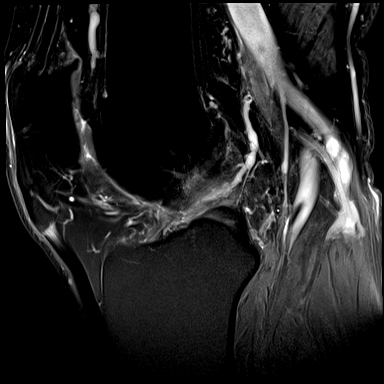
[im 29/36]
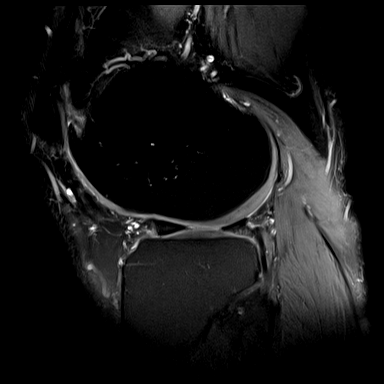
[im 36/36]
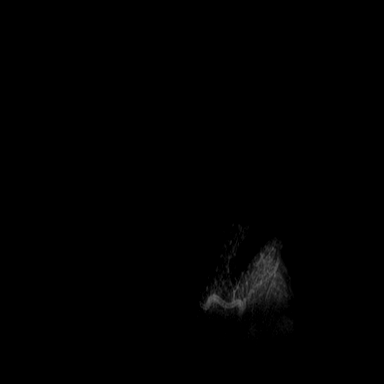

[Series 11: T2 fat-sat · sagittal · right · 3.0mm · 0.39mm/px · 6 of 37 slices shown (3 of 3)]
[im 1/37]
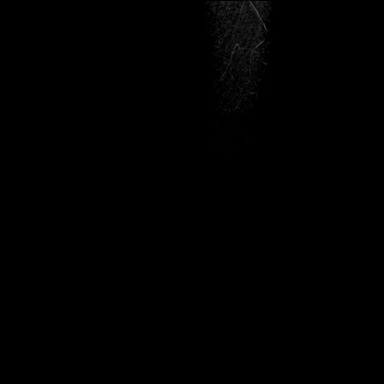
[im 8/37]
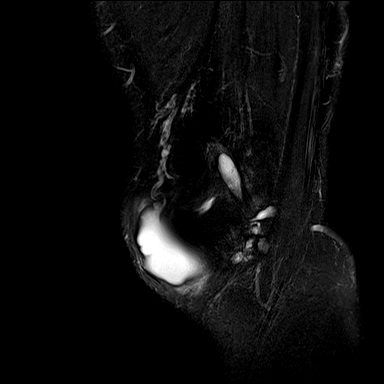
[im 15/37]
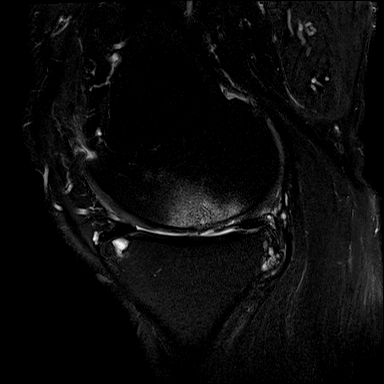
[im 22/37]
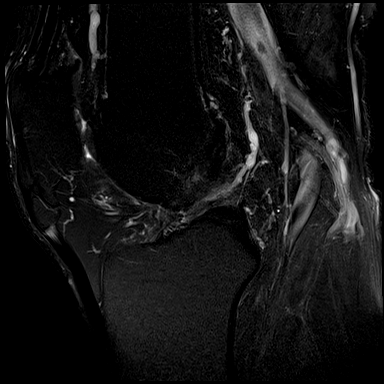
[im 29/37]
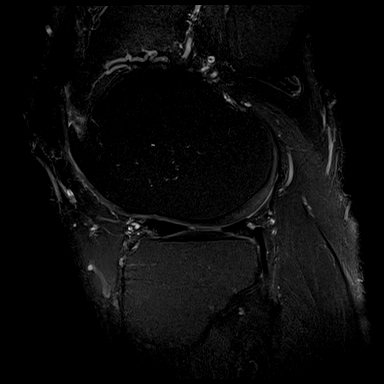
[im 37/37]
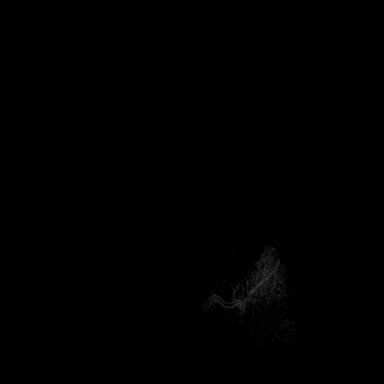

[Series 12: PD · oblique · right · 1.5mm · 0.44mm/px · 4 of 21 slices shown]
[im 1/21]
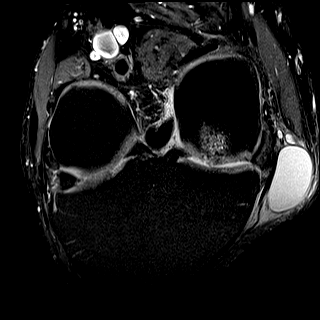
[im 7/21]
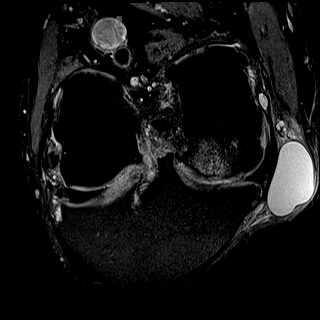
[im 14/21]
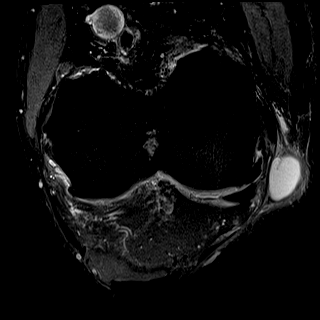
[im 21/21]
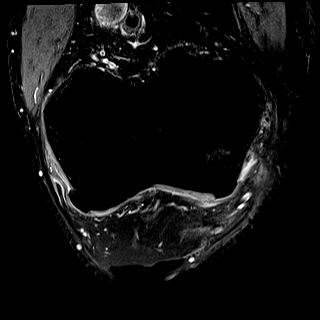

[35 of 40 positions shown; findings below may reference images not displayed]

FINDINGS: MENISCI

Medial meniscus: Intermediate intrinsic signal with loss of
substance of the medial meniscus at the posterior horn-body
junction, which likely represent a combination of degeneration and
prior partial meniscectomy changes. Anterior and posterior root
attachments intact.

Lateral meniscus: Irregular oblique tear extending to the inferior
articular surface of the lateral meniscal body (series 9, image 15;
series 10, images 30-31). Background of mild intrasubstance
degeneration.

LIGAMENTS

Cruciates:  Intact ACL and PCL.

Collaterals: Medial collateral ligament is intact. Lateral
collateral ligament complex is intact.

CARTILAGE

Patellofemoral: Chondral thinning with areas of partial thickness
fissuring along the lateral patellar facet as well as within the
central trochlea.

Medial: Full-thickness cartilage loss of the central weight-bearing
medial femoral condyle with suspected chondral delamination
component (series 11, images 14-15). Medial tibial plateau cartilage
is intact.

Lateral: There is a focal 7 mm area of curvilinear low T2 signal
within the central aspect of the lateral femoral condyle concerning
for a developing cartilage irregularity. No fluid signal is seen
within the cartilage at this location (series 11, image 28).

Joint:  No joint effusion.  Fat pads within normal limits.

Popliteal Fossa:  No Baker cyst. Intact popliteus tendon.

Extensor Mechanism: Intact quadriceps tendon and patellar tendon.
There is ossification within the proximal patellar tendon with
adjacent focal tendinosis.

Bones: Reactive subchondral marrow signal changes within the medial
femoral condyle. Small multilobulated intraosseous ganglion within
the posterior aspect of the proximal tibia near the PCL attachment
site (series 11, image 19). No acute fracture. No malalignment. No
suspicious bone lesion.

Other: Multi lobulated T2 hyperintense structures track along the
posteromedial joint line of the knee (series 6, image 21). These
appear contiguous with a large well-circumscribed T2 homogeneously
hyperintense lesion at the medial knee just superficial to the MCL
measuring 3.5 x 1.7 x 3.3 cm (series 7, image 17). This is favored
to represent a ganglion cyst.
IMPRESSION: 1. Tricompartmental osteoarthritis with full-thickness cartilage
loss involving the central weight-bearing medial femoral condyle
with suspected chondral delamination component.
2. Well-circumscribed cystic structure at the medial joint line
superficial to the MCL measuring 3.5 x 1.7 x 3.3 cm, favored to
represent a ganglion cyst. Additional small multilobulated cystic
structures extending from the posterior joint line to the dominant
cyst are also favored to represent small ganglia.
3. Irregular oblique tear extending to the inferior articular
surface of the lateral meniscal body.
4. Suspected partial meniscectomy changes of the medial meniscus
without evidence to suggest new or recurrent tear.
5. Proximal patellar tendinosis.

## 2022-02-28 ENCOUNTER — Other Ambulatory Visit: Payer: Self-pay | Admitting: Internal Medicine

## 2022-02-28 NOTE — Telephone Encounter (Signed)
Please refill as per office routine med refill policy (all routine meds to be refilled for 3 mo or monthly (per pt preference) up to one year from last visit, then month to month grace period for 3 mo, then further med refills will have to be denied) ? ?

## 2022-03-03 ENCOUNTER — Ambulatory Visit (INDEPENDENT_AMBULATORY_CARE_PROVIDER_SITE_OTHER): Payer: 59

## 2022-03-03 ENCOUNTER — Ambulatory Visit: Payer: 59 | Admitting: Sports Medicine

## 2022-03-03 DIAGNOSIS — M25551 Pain in right hip: Secondary | ICD-10-CM | POA: Diagnosis not present

## 2022-03-03 DIAGNOSIS — R1032 Left lower quadrant pain: Secondary | ICD-10-CM

## 2022-03-03 DIAGNOSIS — M25552 Pain in left hip: Secondary | ICD-10-CM

## 2022-03-03 DIAGNOSIS — G8929 Other chronic pain: Secondary | ICD-10-CM

## 2022-03-03 MED ORDER — GADOBUTROL 1 MMOL/ML IV SOLN
1.0000 mL | Freq: Once | INTRAVENOUS | Status: AC | PRN
  Administered 2022-03-03: 1 mL via INTRAVENOUS

## 2022-03-03 NOTE — Progress Notes (Signed)
? ? ?  Procedures performed today:   ? ?Procedure: Real-time Ultrasound Guided gadolinium contrast injection of left hip joint ?Device: Samsung HS60  ?Verbal informed consent obtained.  ?Time-out conducted.  ?Noted no overlying erythema, induration, or other signs of local infection.  ?Skin prepped in a sterile fashion.  ?Local anesthesia: Topical Ethyl chloride.  ?With sterile technique and under real time ultrasound guidance: Noted arthritic joint, 22-gauge spinal needle advanced to the femoral head/neck junction, contacted bone, I then injected 1 cc kenalog 40, 2 cc lidocaine, 2 cc bupivacaine, syringe again switched and 0.1 cc gadolinium injected, syringe again switched and 10 cc sterile saline used to fully distend the joint. ?Joint visualized and capsule seen distending confirming intra-articular placement of contrast material and medication. ?Completed without difficulty  ?Advised to call if fevers/chills, erythema, induration, drainage, or persistent bleeding.  ?Images permanently stored in PACS ?Impression: Technically successful ultrasound guided gadolinium contrast injection for MR arthrography.  Please see separate MR arthrogram report. ? ?Independent interpretation of notes and tests performed by another provider:  ? ?None. ? ?Brief History, Exam, Impression, and Recommendations:   ? ?Left groin pain ?Injection performed today for MR arthrography, further management per primary treating provider. ?Of note he does have significant osteoarthritis in the joint, we did place some steroid in the injection so he should get a good amount of long-term relief from the arthrogram injection itself. ?If he does not get long-term relief, he is most likely a candidate for arthroplasty rather than arthroscopy or osteotomy. ? ? ? ?___________________________________________ ?Gwen Her. Dianah Field, M.D., ABFM., CAQSM. ?Primary Care and Sports Medicine ?[place] ? ?Adjunct Instructor of Family  Medicine  ?[employer] ?

## 2022-03-03 NOTE — Assessment & Plan Note (Signed)
Injection performed today for MR arthrography, further management per primary treating provider. ?Of note he does have significant osteoarthritis in the joint, we did place some steroid in the injection so he should get a good amount of long-term relief from the arthrogram injection itself. ?If he does not get long-term relief, he is most likely a candidate for arthroplasty rather than arthroscopy or osteotomy. ?

## 2022-03-05 NOTE — Progress Notes (Signed)
MRI arthrogram shows to medium bilateral hip arthritis and degenerative tearing of the left labrum.  The arthritis and the labrum tear are contributing to your pain.  You did have a cortisone injection as part of the arthrogram injection on the 15th.  Hopefully this will provide some good pain relief.  Recommend return to clinic with me in about a month to go over the results and to see how you are feeling and talk about next steps.  I think probably if your pain is not well controlled surgical consultation for hip replacement is the next best option.Howard [name]

## 2022-08-07 ENCOUNTER — Other Ambulatory Visit (INDEPENDENT_AMBULATORY_CARE_PROVIDER_SITE_OTHER): Payer: 59

## 2022-08-07 DIAGNOSIS — E559 Vitamin D deficiency, unspecified: Secondary | ICD-10-CM

## 2022-08-07 DIAGNOSIS — E1165 Type 2 diabetes mellitus with hyperglycemia: Secondary | ICD-10-CM

## 2022-08-07 LAB — LIPID PANEL
Cholesterol: 123 mg/dL (ref 0–200)
HDL: 50.2 mg/dL (ref >39.00)
LDL Cholesterol: 64 mg/dL (ref 0–99)
NonHDL: 72.9
Total CHOL/HDL Ratio: 2
Triglycerides: 43 mg/dL (ref 0.0–149.0)
VLDL: 8.6 mg/dL (ref 0.0–40.0)

## 2022-08-07 LAB — BASIC METABOLIC PANEL
BUN: 12 mg/dL (ref 6–23)
CO2: 27 mEq/L (ref 19–32)
Calcium: 9.3 mg/dL (ref 8.4–10.5)
Chloride: 107 mEq/L (ref 96–112)
Creatinine, Ser: 0.93 mg/dL (ref 0.40–1.50)
GFR: 89.33 mL/min (ref >60.00)
Glucose, Bld: 145 mg/dL — ABNORMAL HIGH (ref 70–99)
Potassium: 3.6 mEq/L (ref 3.5–5.1)
Sodium: 141 mEq/L (ref 135–145)

## 2022-08-07 LAB — HEPATIC FUNCTION PANEL
ALT: 22 U/L (ref 0–53)
AST: 30 U/L (ref 0–37)
Albumin: 4.4 g/dL (ref 3.5–5.2)
Alkaline Phosphatase: 77 U/L (ref 39–117)
Bilirubin, Direct: 0.1 mg/dL (ref 0.0–0.3)
Total Bilirubin: 0.4 mg/dL (ref 0.2–1.2)
Total Protein: 7 g/dL (ref 6.0–8.3)

## 2022-08-07 LAB — HEMOGLOBIN A1C: Hgb A1c MFr Bld: 8.1 % — ABNORMAL HIGH (ref 4.6–6.5)

## 2022-08-07 LAB — VITAMIN D 25 HYDROXY (VIT D DEFICIENCY, FRACTURES): VITD: 18.28 ng/mL — ABNORMAL LOW (ref 30.00–100.00)

## 2022-08-08 ENCOUNTER — Ambulatory Visit: Payer: 59 | Admitting: Internal Medicine

## 2022-08-08 ENCOUNTER — Encounter: Payer: Self-pay | Admitting: Internal Medicine

## 2022-08-08 VITALS — BP 144/92 | HR 73 | Temp 98.2°F | Ht 74.0 in | Wt 238.0 lb

## 2022-08-08 DIAGNOSIS — J309 Allergic rhinitis, unspecified: Secondary | ICD-10-CM

## 2022-08-08 DIAGNOSIS — E559 Vitamin D deficiency, unspecified: Secondary | ICD-10-CM | POA: Diagnosis not present

## 2022-08-08 DIAGNOSIS — Z86010 Personal history of colonic polyps: Secondary | ICD-10-CM

## 2022-08-08 DIAGNOSIS — E7800 Pure hypercholesterolemia, unspecified: Secondary | ICD-10-CM

## 2022-08-08 DIAGNOSIS — I10 Essential (primary) hypertension: Secondary | ICD-10-CM

## 2022-08-08 DIAGNOSIS — E538 Deficiency of other specified B group vitamins: Secondary | ICD-10-CM

## 2022-08-08 DIAGNOSIS — E1165 Type 2 diabetes mellitus with hyperglycemia: Secondary | ICD-10-CM | POA: Diagnosis not present

## 2022-08-08 DIAGNOSIS — Z125 Encounter for screening for malignant neoplasm of prostate: Secondary | ICD-10-CM

## 2022-08-08 MED ORDER — OZEMPIC (0.25 OR 0.5 MG/DOSE) 2 MG/1.5ML ~~LOC~~ SOPN: 0.2500 mg | PEN_INJECTOR | SUBCUTANEOUS | 3 refills | Status: DC

## 2022-08-08 MED ORDER — CETIRIZINE HCL 10 MG PO TABS: 10.0000 mg | ORAL_TABLET | Freq: Every day | ORAL | 3 refills | Status: DC

## 2022-08-08 NOTE — Addendum Note (Signed)
Addended by: Biagio Borg on: 08/08/2022 09:41 AM   Modules accepted: Orders

## 2022-08-08 NOTE — Assessment & Plan Note (Signed)
Last vitamin D Lab Results  Component Value Date   VD25OH 18.28 (L) 08/07/2022   Low, reminded to start oral replacement

## 2022-08-08 NOTE — Progress Notes (Signed)
Patient ID: Howard [name], male   DOB: 13-Jun-1961, 61 y.o.   MRN: [id number]        Chief Complaint: follow up HTN, HLD and hyperglycemia, allergies       HPI:  Howard [name] is a 61 y.o. male here overall doing ok, Does have several wks ongoing nasal allergy symptoms with clearish congestion, itch and sneezing, without fever, pain, ST, cough, swelling or wheezing.   Pt denies polydipsia, polyuria, or new focal neuro s/s.    Pt denies fever, wt loss, night sweats, loss of appetite, or other constitutional symptoms   Did not start the ozempic due to none at the pharmacy last visit, but has some success with wt loss and diet.  Lost 5 lbs with better diet.  Due now for colonoscopy follow up Wt Readings from Last 3 Encounters:  08/08/22 238 lb (108 kg)  02/19/22 244 lb 9.6 oz (110.9 kg)  02/07/22 243 lb (110.2 kg)   BP Readings from Last 3 Encounters:  08/08/22 (!) 144/92  02/19/22 (!) 148/90  02/07/22 136/78         Past Medical History:  Diagnosis Date   Allergic rhinitis, cause unspecified 01/15/2012   Allergy    Cholelithiasis 01/20/2018   Diabetes mellitus    DIABETES MELLITUS, TYPE II 05/24/2007   Qualifier: Diagnosis of  By: Dance CMA (AAMA), Thurmon Fair 10/06/2007   Qualifier: Diagnosis of  By: Alain Marion MD, Evie Lacks    HYPERTENSION 02/06/2008   Qualifier: Diagnosis of  By: Alain Marion MD, Evie Lacks    Lumbar disc disease    s/p surgury approx 1992   PSA, INCREASED 05/11/2009   Qualifier: Diagnosis of  By: Ronnald Ramp MD, Arvid Right.    Vitamin D deficiency 01/26/2020   Past Surgical History:  Procedure Laterality Date   BACK SURGERY  1989   Lumbar/Ruptured Disk   HERNIA REPAIR  may 2012   left inguinal   KNEE ARTHROSCOPY Right 2014   NASAL SEPTUM SURGERY      reports that he has never smoked. He has never used smokeless tobacco. He reports current alcohol use. He reports that he does not use drugs. family history includes Cancer in his mother; Diabetes in his father and  mother; Hypertension in his father and mother. No Known Allergies Current Outpatient Medications on File Prior to Visit  Medication Sig Dispense Refill   aspirin 81 MG EC tablet Take 1 tablet (81 mg total) by mouth daily. Swallow whole. 30 tablet 12   Blood Glucose Monitoring Suppl (ONE TOUCH ULTRA 2) W/DEVICE KIT Use as directed daily 1 each 0   glucose blood (ONE TOUCH ULTRA TEST) test strip Use as instructed once daily. Diagnosis 250.0 100 each 12   Lancets MISC 1 application. by Does not apply route daily. 100 each 12   lisinopril (ZESTRIL) 40 MG tablet Take 1 tablet (40 mg total) by mouth daily. 90 tablet 3   meloxicam (MOBIC) 15 MG tablet Take 1 tablet (15 mg total) by mouth daily as needed for pain. 90 tablet 3   metFORMIN (GLUCOPHAGE-XR) 500 MG 24 hr tablet 4 tab by mouth in the AM 360 tablet 3   rosuvastatin (CRESTOR) 40 MG tablet Take 1 tablet by mouth once daily 90 tablet 3   No current facility-administered medications on file prior to visit.        ROS:  All others reviewed and negative.  Objective  PE:  BP (!) 144/92 (BP Location: Left Arm, Patient Position: Sitting, Cuff Size: Large)   Pulse 73   Temp 98.2 F (36.8 C) (Oral)   Ht _0  (1.88 m)   Wt 238 lb (108 kg)   SpO2 96%   BMI 30.56 kg/m                 Constitutional: Pt appears in NAD               HENT: Head: NCAT.                Right Ear: External ear normal.                 Left Ear: External ear normal.                Eyes: . Pupils are equal, round, and reactive to light. Conjunctivae and EOM are normal               Nose: without d/c or deformity               Neck: Neck supple. Gross normal ROM               Cardiovascular: Normal rate and regular rhythm.                 Pulmonary/Chest: Effort normal and breath sounds without rales or wheezing.                Abd:  Soft, NT, ND, + BS, no organomegaly               Neurological: Pt is alert. At baseline orientation, motor grossly intact                Skin: Skin is warm. No rashes, no other new lesions, LE edema - none               Psychiatric: Pt behavior is normal without agitation   Micro: none  Cardiac tracings I have personally interpreted today:  none  Pertinent Radiological findings (summarize): none   Lab Results  Component Value Date   WBC 3.8 (L) 02/06/2022   HGB 13.3 02/06/2022   HCT 41.1 02/06/2022   PLT 182.0 02/06/2022   GLUCOSE 145 (H) 08/07/2022   CHOL 123 08/07/2022   TRIG 43.0 08/07/2022   HDL 50.20 08/07/2022   LDLDIRECT 127.5 04/02/2007   LDLCALC 64 08/07/2022   ALT 22 08/07/2022   AST 30 08/07/2022   NA 141 08/07/2022   K 3.6 08/07/2022   CL 107 08/07/2022   CREATININE 0.93 08/07/2022   BUN 12 08/07/2022   CO2 27 08/07/2022   TSH 1.15 02/06/2022   PSA 1.93 02/06/2022   HGBA1C 8.1 (H) 08/07/2022   MICROALBUR 2.1 (H) 02/06/2022   Assessment/Plan:  Howard [name] is a 61 y.o. Black or African American [2] male with  has a past medical history of Allergic rhinitis, cause unspecified (01/15/2012), Allergy, Cholelithiasis (01/20/2018), Diabetes mellitus, DIABETES MELLITUS, TYPE II (05/24/2007), HYPERLIPIDEMIA (10/06/2007), HYPERTENSION (02/06/2008), Lumbar disc disease, PSA, INCREASED (05/11/2009), and Vitamin D deficiency (01/26/2020).  Vitamin D deficiency Last vitamin D Lab Results  Component Value Date   VD25OH 18.28 (L) 08/07/2022   Low, reminded to start oral replacement   Hyperlipidemia Lab Results  Component Value Date   LDLCALC 64 08/07/2022   Stable, pt to continue current statin crestor 40 mg, note recent cardiac CT score was zero  Essential hypertension BP Readings from Last 3 Encounters:  08/08/22 (!) 144/92  02/19/22 (!) 148/90  02/07/22 136/78   Uncontrolled recently, pt states better controlled at home so declines changes today, pt to continue medical treatment lisinopril 40 mg qd   Diabetes ([place]) Lab Results  Component Value Date   HGBA1C 8.1 (H) 08/07/2022    Improved with better diet and wt loss, but still uncontrolled, pt to continue current medical treatment metformin ER 500 mg qd, but add ozempic 0.25 mg weekly   Allergic rhinitis Mild to mod, for otc zyrtec 10 mg,  and nasacort otc,  to f/u any worsening symptoms or concerns  Followup: Return in about 6 months (around 02/07/2023).  Cathlean Cower, MD 08/08/2022 9:40 AM [facility]

## 2022-08-08 NOTE — Assessment & Plan Note (Signed)
Lab Results  Component Value Date   LDLCALC 64 08/07/2022   Stable, pt to continue current statin crestor 40 mg, note recent cardiac CT score was zero

## 2022-08-08 NOTE — Assessment & Plan Note (Signed)
Lab Results  Component Value Date   HGBA1C 8.1 (H) 08/07/2022   Improved with better diet and wt loss, but still uncontrolled, pt to continue current medical treatment metformin ER 500 mg qd, but add ozempic 0.25 mg weekly

## 2022-08-08 NOTE — Patient Instructions (Addendum)
Please have your Shingrix (shingles) shots done at your local pharmacy.  Please take all new medication as prescribed  - the low dose ozempic, and the zyrtec for allergies as needed  Please monitor your BP at home on a regular basis, with the goal being to be at least less than 140/90  Please continue all other medications as before, and refills have been done if requested.  Please have the pharmacy call with any other refills you may need.  Please continue your efforts at being more active, low cholesterol diet, and weight control.  Please keep your appointments with your specialists as you may have planned  You will be contacted regarding the referral for: colonoscopy  Please make an Appointment to return in 6 months, or sooner if needed, also with Lab Appointment for testing done 3-5 days before at the [facility] (so this is for TWO appointments - please see the scheduling desk as you leave)

## 2022-08-08 NOTE — Assessment & Plan Note (Signed)
Mild to mod, for otc zyrtec 10 mg,  and nasacort otc,  to f/u any worsening symptoms or concerns

## 2022-08-08 NOTE — Assessment & Plan Note (Signed)
BP Readings from Last 3 Encounters:  08/08/22 (!) 144/92  02/19/22 (!) 148/90  02/07/22 136/78   Uncontrolled recently, pt states better controlled at home so declines changes today, pt to continue medical treatment lisinopril 40 mg qd

## 2022-11-28 ENCOUNTER — Encounter: Payer: Self-pay | Admitting: Internal Medicine

## 2023-01-07 ENCOUNTER — Telehealth: Payer: Self-pay

## 2023-01-07 NOTE — Progress Notes (Signed)
   01/07/2023  Patient ID: Howard [name], male   DOB: 1961/06/13, 62 y.o.   MRN: [id number]  Outreach attempt to return patient call/voicemail.  Left message for him to call me back at his convenience.  Darlina Guys, PharmD, DPLA

## 2023-01-13 LAB — HM DIABETES EYE EXAM

## 2023-02-12 ENCOUNTER — Ambulatory Visit: Payer: 59 | Admitting: Internal Medicine

## 2023-02-13 ENCOUNTER — Other Ambulatory Visit (INDEPENDENT_AMBULATORY_CARE_PROVIDER_SITE_OTHER): Payer: 59

## 2023-02-13 DIAGNOSIS — Z125 Encounter for screening for malignant neoplasm of prostate: Secondary | ICD-10-CM | POA: Diagnosis not present

## 2023-02-13 DIAGNOSIS — E538 Deficiency of other specified B group vitamins: Secondary | ICD-10-CM | POA: Diagnosis not present

## 2023-02-13 DIAGNOSIS — E559 Vitamin D deficiency, unspecified: Secondary | ICD-10-CM

## 2023-02-13 DIAGNOSIS — E1165 Type 2 diabetes mellitus with hyperglycemia: Secondary | ICD-10-CM | POA: Diagnosis not present

## 2023-02-13 LAB — HEPATIC FUNCTION PANEL
ALT: 19 U/L (ref 0–53)
AST: 22 U/L (ref 0–37)
Albumin: 4.1 g/dL (ref 3.5–5.2)
Alkaline Phosphatase: 71 U/L (ref 39–117)
Bilirubin, Direct: 0.1 mg/dL (ref 0.0–0.3)
Total Bilirubin: 0.4 mg/dL (ref 0.2–1.2)
Total Protein: 6.5 g/dL (ref 6.0–8.3)

## 2023-02-13 LAB — CBC WITH DIFFERENTIAL/PLATELET
Basophils Absolute: 0 10*3/uL (ref 0.0–0.1)
Basophils Relative: 1 % (ref 0.0–3.0)
Eosinophils Absolute: 0.1 10*3/uL (ref 0.0–0.7)
Eosinophils Relative: 2.2 % (ref 0.0–5.0)
HCT: 41.1 % (ref 39.0–52.0)
Hemoglobin: 13.6 g/dL (ref 13.0–17.0)
Lymphocytes Relative: 35.4 % (ref 12.0–46.0)
Lymphs Abs: 1.1 10*3/uL (ref 0.7–4.0)
MCHC: 33.2 g/dL (ref 30.0–36.0)
MCV: 83.1 fl (ref 78.0–100.0)
Monocytes Absolute: 0.4 10*3/uL (ref 0.1–1.0)
Monocytes Relative: 12.1 % — ABNORMAL HIGH (ref 3.0–12.0)
Neutro Abs: 1.6 10*3/uL (ref 1.4–7.7)
Neutrophils Relative %: 49.3 % (ref 43.0–77.0)
Platelets: 196 10*3/uL (ref 150.0–400.0)
RBC: 4.94 Mil/uL (ref 4.22–5.81)
RDW: 14.8 % (ref 11.5–15.5)
WBC: 3.2 10*3/uL — ABNORMAL LOW (ref 4.0–10.5)

## 2023-02-13 LAB — BASIC METABOLIC PANEL
BUN: 14 mg/dL (ref 6–23)
CO2: 29 mEq/L (ref 19–32)
Calcium: 9.2 mg/dL (ref 8.4–10.5)
Chloride: 104 mEq/L (ref 96–112)
Creatinine, Ser: 0.84 mg/dL (ref 0.40–1.50)
GFR: 94.52 mL/min (ref >60.00)
Glucose, Bld: 122 mg/dL — ABNORMAL HIGH (ref 70–99)
Potassium: 4.2 mEq/L (ref 3.5–5.1)
Sodium: 139 mEq/L (ref 135–145)

## 2023-02-13 LAB — LIPID PANEL
Cholesterol: 137 mg/dL (ref 0–200)
HDL: 47.6 mg/dL (ref >39.00)
LDL Cholesterol: 78 mg/dL (ref 0–99)
NonHDL: 88.92
Total CHOL/HDL Ratio: 3
Triglycerides: 53 mg/dL (ref 0.0–149.0)
VLDL: 10.6 mg/dL (ref 0.0–40.0)

## 2023-02-13 LAB — URINALYSIS, ROUTINE W REFLEX MICROSCOPIC
Bilirubin Urine: NEGATIVE
Hgb urine dipstick: NEGATIVE
Ketones, ur: NEGATIVE
Leukocytes,Ua: NEGATIVE
Nitrite: NEGATIVE
Specific Gravity, Urine: >=1.03 — AB (ref 1.000–1.030)
Total Protein, Urine: NEGATIVE
Urine Glucose: NEGATIVE
Urobilinogen, UA: 0.2 (ref 0.0–1.0)
pH: 5.5 (ref 5.0–8.0)

## 2023-02-13 LAB — HEMOGLOBIN A1C: Hgb A1c MFr Bld: 7.6 % — ABNORMAL HIGH (ref 4.6–6.5)

## 2023-02-13 LAB — MICROALBUMIN / CREATININE URINE RATIO
Creatinine,U: 146.3 mg/dL
Microalb Creat Ratio: 0.7 mg/g (ref 0.0–30.0)
Microalb, Ur: 1.1 mg/dL (ref 0.0–1.9)

## 2023-02-13 LAB — TSH: TSH: 1.3 u[IU]/mL (ref 0.35–5.50)

## 2023-02-13 LAB — VITAMIN B12: Vitamin B-12: 259 pg/mL (ref 211–911)

## 2023-02-13 LAB — VITAMIN D 25 HYDROXY (VIT D DEFICIENCY, FRACTURES): VITD: 16.75 ng/mL — ABNORMAL LOW (ref 30.00–100.00)

## 2023-02-13 LAB — PSA: PSA: 2.27 ng/mL (ref 0.10–4.00)

## 2023-02-17 ENCOUNTER — Encounter: Payer: Self-pay | Admitting: Internal Medicine

## 2023-02-17 ENCOUNTER — Ambulatory Visit: Payer: 59 | Admitting: Internal Medicine

## 2023-02-17 VITALS — BP 122/76 | HR 70 | Temp 98.4°F | Ht 74.0 in | Wt 234.0 lb

## 2023-02-17 DIAGNOSIS — Z0001 Encounter for general adult medical examination with abnormal findings: Secondary | ICD-10-CM

## 2023-02-17 DIAGNOSIS — Z1211 Encounter for screening for malignant neoplasm of colon: Secondary | ICD-10-CM

## 2023-02-17 DIAGNOSIS — Z7985 Long-term (current) use of injectable non-insulin antidiabetic drugs: Secondary | ICD-10-CM | POA: Diagnosis not present

## 2023-02-17 DIAGNOSIS — E559 Vitamin D deficiency, unspecified: Secondary | ICD-10-CM

## 2023-02-17 DIAGNOSIS — E7800 Pure hypercholesterolemia, unspecified: Secondary | ICD-10-CM | POA: Diagnosis not present

## 2023-02-17 DIAGNOSIS — Z7984 Long term (current) use of oral hypoglycemic drugs: Secondary | ICD-10-CM | POA: Diagnosis not present

## 2023-02-17 DIAGNOSIS — I10 Essential (primary) hypertension: Secondary | ICD-10-CM

## 2023-02-17 DIAGNOSIS — E1165 Type 2 diabetes mellitus with hyperglycemia: Secondary | ICD-10-CM | POA: Diagnosis not present

## 2023-02-17 MED ORDER — OZEMPIC (0.25 OR 0.5 MG/DOSE) 2 MG/1.5ML ~~LOC~~ SOPN: 0.2500 mg | PEN_INJECTOR | SUBCUTANEOUS | 3 refills | Status: DC

## 2023-02-17 NOTE — Patient Instructions (Addendum)
Please have your Shingrix (shingles) shots done at your local pharmacy.  Please take all new medication as prescribed - the ozempic 0.25 mg weekly  Please call in 1 month if you would want the next higher dose at 0.5 mg weekly  Please continue all other medications as before, and refills have been done if requested.  Please have the pharmacy call with any other refills you may need.  Please continue your efforts at being more active, low cholesterol diet, and weight control.  You are otherwise up to date with prevention measures today.  Please keep your appointments with your specialists as you may have planned  You will be contacted regarding the referral for: colonoscopy  Please make an Appointment to return in 6 months, or sooner if needed, also with Lab Appointment for testing done 3-5 days before at the [facility] (so this is for TWO appointments - please see the scheduling desk as you leave)

## 2023-02-17 NOTE — Progress Notes (Unsigned)
Patient ID: Howard [name], male   DOB: 08-01-1961, 62 y.o.   MRN: [id number]         Chief Complaint:: wellness exam and dm, htn, low vitd, hld       HPI:  Howard [name] is a 62 y.o. male here for wellness exam;   declines tdap, covid booster, for shingrix at pharmacy,   and now due for f/u colonoscopy o/w up to date                 Also Pt denies chest pain, increased sob or doe, wheezing, orthopnea, PND, increased LE swelling, palpitations, dizziness or syncope.   Pt denies polydipsia, polyuria, or new focal neuro s/s.    Pt denies fever, wt loss, night sweats, loss of appetite, or other constitutional symptoms     Wt Readings from Last 3 Encounters:  02/17/23 234 lb (106.1 kg)  08/08/22 238 lb (108 kg)  02/19/22 244 lb 9.6 oz (110.9 kg)   BP Readings from Last 3 Encounters:  02/17/23 122/76  08/08/22 (!) 144/92  02/19/22 (!) 148/90   Immunization History  Administered Date(s) Administered   PFIZER(Purple Top)SARS-COV-2 Vaccination 01/11/2020, 02/01/2020, 08/13/2020, 06/21/2021   Health Maintenance Due  Topic Date Due   DTaP/Tdap/Td (1 - Tdap) Never done      Past Medical History:  Diagnosis Date   Allergic rhinitis, cause unspecified 01/15/2012   Allergy    Cholelithiasis 01/20/2018   Diabetes mellitus    DIABETES MELLITUS, TYPE II 05/24/2007   Qualifier: Diagnosis of  By: Dance CMA (AAMA), Guillermina [place] 10/06/2007   Qualifier: Diagnosis of  By: Posey Rea MD, Georgina Quint    HYPERTENSION 02/06/2008   Qualifier: Diagnosis of  By: Posey Rea MD, Aleksei V    Lumbar disc disease    s/p surgury approx 1992   PSA, INCREASED 05/11/2009   Qualifier: Diagnosis of  By: Yetta Barre MD, Bernadene Bell.    Vitamin D deficiency 01/26/2020   Past Surgical History:  Procedure Laterality Date   BACK SURGERY  1989   Lumbar/Ruptured Disk   HERNIA REPAIR  may 2012   left inguinal   KNEE ARTHROSCOPY Right 2014   NASAL SEPTUM SURGERY      reports that he has never smoked. He has never used  smokeless tobacco. He reports current alcohol use. He reports that he does not use drugs. family history includes Cancer in his mother; Diabetes in his father and mother; Hypertension in his father and mother. No Known Allergies Current Outpatient Medications on File Prior to Visit  Medication Sig Dispense Refill   aspirin 81 MG EC tablet Take 1 tablet (81 mg total) by mouth daily. Swallow whole. 30 tablet 12   Blood Glucose Monitoring Suppl (ONE TOUCH ULTRA 2) W/DEVICE KIT Use as directed daily 1 each 0   cetirizine (ZYRTEC) 10 MG tablet Take 1 tablet (10 mg total) by mouth daily. 90 tablet 3   glucose blood (ONE TOUCH ULTRA TEST) test strip Use as instructed once daily. Diagnosis 250.0 100 each 12   Lancets MISC 1 application. by Does not apply route daily. 100 each 12   lisinopril (ZESTRIL) 40 MG tablet Take 1 tablet (40 mg total) by mouth daily. 90 tablet 3   meloxicam (MOBIC) 15 MG tablet Take 1 tablet (15 mg total) by mouth daily as needed for pain. 90 tablet 3   metFORMIN (GLUCOPHAGE-XR) 500 MG 24 hr tablet 4 tab by mouth in the AM 360  tablet 3   rosuvastatin (CRESTOR) 40 MG tablet Take 1 tablet by mouth once daily 90 tablet 3   No current facility-administered medications on file prior to visit.        ROS:  All others reviewed and negative.  Objective        PE:  BP 122/76 (BP Location: Right Arm, Patient Position: Sitting, Cuff Size: Normal)   Pulse 70   Temp 98.4 F (36.9 C) (Oral)   Ht 6\' 2"  (1.88 m)   Wt 234 lb (106.1 kg)   SpO2 99%   BMI 30.04 kg/m                 Constitutional: Pt appears in NAD               HENT: Head: NCAT.                Right Ear: External ear normal.                 Left Ear: External ear normal.                Eyes: . Pupils are equal, round, and reactive to light. Conjunctivae and EOM are normal               Nose: without d/c or deformity               Neck: Neck supple. Gross normal ROM               Cardiovascular: Normal rate and  regular rhythm.                 Pulmonary/Chest: Effort normal and breath sounds without rales or wheezing.                Abd:  Soft, NT, ND, + BS, no organomegaly               Neurological: Pt is alert. At baseline orientation, motor grossly intact               Skin: Skin is warm. No rashes, no other new lesions, LE edema - none               Psychiatric: Pt behavior is normal without agitation   Micro: none  Cardiac tracings I have personally interpreted today:  none  Pertinent Radiological findings (summarize): none   Lab Results  Component Value Date   WBC 3.2 (L) 02/13/2023   HGB 13.6 02/13/2023   HCT 41.1 02/13/2023   PLT 196.0 02/13/2023   GLUCOSE 122 (H) 02/13/2023   CHOL 137 02/13/2023   TRIG 53.0 02/13/2023   HDL 47.60 02/13/2023   LDLDIRECT 127.5 04/02/2007   LDLCALC 78 02/13/2023   ALT 19 02/13/2023   AST 22 02/13/2023   NA 139 02/13/2023   K 4.2 02/13/2023   CL 104 02/13/2023   CREATININE 0.84 02/13/2023   BUN 14 02/13/2023   CO2 29 02/13/2023   TSH 1.30 02/13/2023   PSA 2.27 02/13/2023   HGBA1C 7.6 (H) 02/13/2023   MICROALBUR 1.1 02/13/2023   Assessment/Plan:  Howard [name] is a 62 y.o. Black or African American [2] male with  has a past medical history of Allergic rhinitis, cause unspecified (01/15/2012), Allergy, Cholelithiasis (01/20/2018), Diabetes mellitus, DIABETES MELLITUS, TYPE II (05/24/2007), HYPERLIPIDEMIA (10/06/2007), HYPERTENSION (02/06/2008), Lumbar disc disease, PSA, INCREASED (05/11/2009), and Vitamin D deficiency (01/26/2020).  Encounter for well adult exam with abnormal findings Age  and sex appropriate education and counseling updated with regular exercise and diet Referrals for preventative services - for colonoscopy Immunizations addressed - for shingrix at pharmacy, declines covid booster Smoking counseling  - none needed Evidence for depression or other mood disorder - none significant Most recent labs reviewed. I have personally  reviewed and have noted: 1) the patient's medical and social history 2) The patient's current medications and supplements 3) The patient's height, weight, and BMI have been recorded in the chart   Diabetes [facility]) Lab Results  Component Value Date   HGBA1C 7.6 (H) 02/13/2023   uncontrolled, pt to continue current medical treatment metformin ER 500 gm - 4 qd, but also add ozempic 0.25 mg weekly for better sugar and wt control   Essential hypertension BP Readings from Last 3 Encounters:  02/17/23 122/76  08/08/22 (!) 144/92  02/19/22 (!) 148/90   Stable, pt to continue medical treatment lisinopril 40 qd   Hyperlipidemia Lab Results  Component Value Date   LDLCALC 78 02/13/2023   Uncontrolled,  pt to continue current statin crestor 40 qd, goal ldl < 70, pt declines add zetia for now, for lower chol DM diet    Vitamin D deficiency Last vitamin D Lab Results  Component Value Date   VD25OH 16.75 (L) 02/13/2023   Low, to start oral replacement  Followup: Return in about 6 months (around 08/19/2023).  Oliver Barre, MD 02/19/2023 8:49 PM [facility]

## 2023-02-19 ENCOUNTER — Encounter: Payer: Self-pay | Admitting: Internal Medicine

## 2023-02-19 NOTE — Assessment & Plan Note (Signed)
Last vitamin D Lab Results  Component Value Date   VD25OH 16.75 (L) 02/13/2023   Low, to start oral replacement

## 2023-02-19 NOTE — Assessment & Plan Note (Signed)
Age and sex appropriate education and counseling updated with regular exercise and diet Referrals for preventative services - for colonoscopy Immunizations addressed - for shingrix at pharmacy, declines covid booster Smoking counseling  - none needed Evidence for depression or other mood disorder - none significant Most recent labs reviewed. I have personally reviewed and have noted: 1) the patient's medical and social history 2) The patient's current medications and supplements 3) The patient's height, weight, and BMI have been recorded in the chart  

## 2023-02-19 NOTE — Assessment & Plan Note (Signed)
Lab Results  Component Value Date   HGBA1C 7.6 (H) 02/13/2023   uncontrolled, pt to continue current medical treatment metformin ER 500 gm - 4 qd, but also add ozempic 0.25 mg weekly for better sugar and wt control

## 2023-02-19 NOTE — Assessment & Plan Note (Signed)
Lab Results  Component Value Date   LDLCALC 78 02/13/2023   Uncontrolled,  pt to continue current statin crestor 40 qd, goal ldl < 70, pt declines add zetia for now, for lower chol DM diet

## 2023-02-19 NOTE — Assessment & Plan Note (Signed)
BP Readings from Last 3 Encounters:  02/17/23 122/76  08/08/22 (!) 144/92  02/19/22 (!) 148/90   Stable, pt to continue medical treatment lisinopril 40 qd

## 2023-08-18 ENCOUNTER — Ambulatory Visit: Payer: 59 | Admitting: Internal Medicine

## 2023-08-23 ENCOUNTER — Other Ambulatory Visit: Payer: Self-pay | Admitting: Internal Medicine

## 2023-08-24 ENCOUNTER — Other Ambulatory Visit: Payer: Self-pay

## 2023-08-31 ENCOUNTER — Other Ambulatory Visit (INDEPENDENT_AMBULATORY_CARE_PROVIDER_SITE_OTHER): Payer: 59

## 2023-08-31 DIAGNOSIS — E559 Vitamin D deficiency, unspecified: Secondary | ICD-10-CM

## 2023-08-31 DIAGNOSIS — E1165 Type 2 diabetes mellitus with hyperglycemia: Secondary | ICD-10-CM | POA: Diagnosis not present

## 2023-08-31 LAB — BASIC METABOLIC PANEL
BUN: 9 mg/dL (ref 6–23)
CO2: 28 meq/L (ref 19–32)
Calcium: 9 mg/dL (ref 8.4–10.5)
Chloride: 103 meq/L (ref 96–112)
Creatinine, Ser: 0.87 mg/dL (ref 0.40–1.50)
GFR: 93.17 mL/min (ref >60.00)
Glucose, Bld: 159 mg/dL — ABNORMAL HIGH (ref 70–99)
Potassium: 4.2 meq/L (ref 3.5–5.1)
Sodium: 138 meq/L (ref 135–145)

## 2023-08-31 LAB — HEPATIC FUNCTION PANEL
ALT: 20 U/L (ref 0–53)
AST: 20 U/L (ref 0–37)
Albumin: 4.1 g/dL (ref 3.5–5.2)
Alkaline Phosphatase: 85 U/L (ref 39–117)
Bilirubin, Direct: 0.1 mg/dL (ref 0.0–0.3)
Total Bilirubin: 0.4 mg/dL (ref 0.2–1.2)
Total Protein: 6.8 g/dL (ref 6.0–8.3)

## 2023-08-31 LAB — LIPID PANEL
Cholesterol: 141 mg/dL (ref 0–200)
HDL: 50.5 mg/dL (ref >39.00)
LDL Cholesterol: 79 mg/dL (ref 0–99)
NonHDL: 90.75
Total CHOL/HDL Ratio: 3
Triglycerides: 58 mg/dL (ref 0.0–149.0)
VLDL: 11.6 mg/dL (ref 0.0–40.0)

## 2023-08-31 LAB — HEMOGLOBIN A1C: Hgb A1c MFr Bld: 8.1 % — ABNORMAL HIGH (ref 4.6–6.5)

## 2023-08-31 LAB — VITAMIN D 25 HYDROXY (VIT D DEFICIENCY, FRACTURES): VITD: 16.04 ng/mL — ABNORMAL LOW (ref 30.00–100.00)

## 2023-09-01 ENCOUNTER — Encounter: Payer: Self-pay | Admitting: Internal Medicine

## 2023-09-01 ENCOUNTER — Telehealth: Payer: Self-pay | Admitting: Internal Medicine

## 2023-09-01 ENCOUNTER — Ambulatory Visit: Payer: 59 | Admitting: Internal Medicine

## 2023-09-01 VITALS — BP 136/72 | HR 65 | Temp 98.5°F | Ht 74.0 in | Wt 238.0 lb

## 2023-09-01 DIAGNOSIS — E559 Vitamin D deficiency, unspecified: Secondary | ICD-10-CM

## 2023-09-01 DIAGNOSIS — I10 Essential (primary) hypertension: Secondary | ICD-10-CM

## 2023-09-01 DIAGNOSIS — E7800 Pure hypercholesterolemia, unspecified: Secondary | ICD-10-CM

## 2023-09-01 DIAGNOSIS — Z125 Encounter for screening for malignant neoplasm of prostate: Secondary | ICD-10-CM

## 2023-09-01 DIAGNOSIS — Z7984 Long term (current) use of oral hypoglycemic drugs: Secondary | ICD-10-CM | POA: Diagnosis not present

## 2023-09-01 DIAGNOSIS — E1165 Type 2 diabetes mellitus with hyperglycemia: Secondary | ICD-10-CM

## 2023-09-01 DIAGNOSIS — E538 Deficiency of other specified B group vitamins: Secondary | ICD-10-CM

## 2023-09-01 MED ORDER — OZEMPIC (0.25 OR 0.5 MG/DOSE) 2 MG/1.5ML ~~LOC~~ SOPN: 0.2500 mg | PEN_INJECTOR | SUBCUTANEOUS | 3 refills | Status: DC

## 2023-09-01 NOTE — Assessment & Plan Note (Signed)
Lab Results  Component Value Date   HGBA1C 8.1 (H) 08/31/2023   unocntrolle pt to continue current medical treatment metformin ER 500 mg - 4 every day, and start ozempic 0.25 weekly

## 2023-09-01 NOTE — Assessment & Plan Note (Signed)
Lab Results  Component Value Date   LDLCALC 79 08/31/2023   Mild uncontrolled, pt to continue current statin crestor 40 mg every day with good compliance and lower chol diet, delcines other change

## 2023-09-01 NOTE — Patient Instructions (Signed)
Ok to start the ozempic low dose, and let us know by Mychart in 1 month if you are doing well, for the higher dosing  Please take OTC Vitamin D3 at 2000 units per day, indefinitely, and B12 1000 mcg per day for 6 months only  Please continue all other medications as before, and refills have been done if requested.  Please have the pharmacy call with any other refills you may need.  Please continue your efforts at being more active, low cholesterol diet, and weight control.  Please keep your appointments with your specialists as you may have planned  Please make an Appointment to return in 6 months, or sooner if needed, also with Lab Appointment for testing done 3-5 days before at the FIRST FLOOR Lab (so this is for TWO appointments - please see the scheduling desk as you leave)

## 2023-09-01 NOTE — Assessment & Plan Note (Signed)
Lab Results  Component Value Date   VITAMINB12 259 02/13/2023   Low, to start oral replacement - b12 1000 mcg qd

## 2023-09-01 NOTE — Assessment & Plan Note (Signed)
Last vitamin D Lab Results  Component Value Date   VD25OH 16.04 (L) 08/31/2023   Low, to start oral replacement

## 2023-09-01 NOTE — Progress Notes (Signed)
Patient ID: Howard [name], male   DOB: [birth year], 62 y.o.   MRN: [id number]        Chief Complaint: follow up HTN, HLD and DM, low vit d an b12       HPI:  Howard [name] is a 62 y.o. male here overall doing ok, Pt denies chest pain, increased sob or doe, wheezing, orthopnea, PND, increased LE swelling, palpitations, dizziness or syncope.   Pt denies polydipsia, polyuria, or new focal neuro s/s.    Pt denies fever, wt loss, night sweats, loss of appetite, or other constitutional symptoms  has not yet started ozempic but now willing to start  Has colonsocopy for dec 2024.   Wt Readings from Last 3 Encounters:  09/01/23 238 lb (108 kg)  02/17/23 234 lb (106.1 kg)  08/08/22 238 lb (108 kg)   BP Readings from Last 3 Encounters:  09/01/23 136/72  02/17/23 122/76  08/08/22 (!) 144/92         Past Medical History:  Diagnosis Date   Allergic rhinitis, cause unspecified 01/15/2012   Allergy    Cholelithiasis 01/20/2018   Diabetes mellitus    DIABETES MELLITUS, TYPE II 05/24/2007   Qualifier: Diagnosis of  By: Dance CMA (AAMA), Guill[place] 10/06/2007   Qualifier: Diagnosis of  By: Posey Rea MD, Georgina Quint    HYPERTENSION 02/06/2008   Qualifier: Diagnosis of  By: Posey Rea MD, Georgina Quint    Lumbar disc disease    s/p surgury approx 1992   PSA, INCREASED 05/11/2009   Qualifier: Diagnosis of  By: Yetta Barre MD, Bernadene Bell.    Vitamin D deficiency 01/26/2020   Past Surgical History:  Procedure Laterality Date   BACK SURGERY  1989   Lumbar/Ruptured Disk   HERNIA REPAIR  may 2012   left inguinal   KNEE ARTHROSCOPY Right 2014   NASAL SEPTUM SURGERY      reports that he has never smoked. He has never used smokeless tobacco. He reports current alcohol use. He reports that he does not use drugs. family history includes Cancer in his mother; Diabetes in his father and mother; Hypertension in his father and mother. No Known Allergies Current Outpatient Medications on File Prior to Visit   Medication Sig Dispense Refill   aspirin 81 MG EC tablet Take 1 tablet (81 mg total) by mouth daily. Swallow whole. 30 tablet 12   Blood Glucose Monitoring Suppl (ONE TOUCH ULTRA 2) W/DEVICE KIT Use as directed daily 1 each 0   glucose blood (ONE TOUCH ULTRA TEST) test strip Use as instructed once daily. Diagnosis 250.0 100 each 12   Lancets MISC 1 application. by Does not apply route daily. 100 each 12   lisinopril (ZESTRIL) 40 MG tablet Take 1 tablet by mouth once daily 90 tablet 3   meloxicam (MOBIC) 15 MG tablet Take 1 tablet (15 mg total) by mouth daily as needed for pain. 90 tablet 3   metFORMIN (GLUCOPHAGE-XR) 500 MG 24 hr tablet TAKE 4 TABLETS BY MOUTH IN THE MORNING 360 tablet 3   rosuvastatin (CRESTOR) 40 MG tablet Take 1 tablet by mouth once daily 90 tablet 3   cetirizine (ZYRTEC) 10 MG tablet Take 1 tablet (10 mg total) by mouth daily. 90 tablet 3   No current facility-administered medications on file prior to visit.        ROS:  All others reviewed and negative.  Objective        PE:  BP 136/72 (  BP Location: Right Arm, Patient Position: Sitting, Cuff Size: Normal)   Pulse 65   Temp 98.5 F (36.9 C) (Oral)   Ht 6\' 2"  (1.88 m)   Wt 238 lb (108 kg)   SpO2 98%   BMI 30.56 kg/m                 Constitutional: Pt appears in NAD               HENT: Head: NCAT.                Right Ear: External ear normal.                 Left Ear: External ear normal.                Eyes: . Pupils are equal, round, and reactive to light. Conjunctivae and EOM are normal               Nose: without d/c or deformity               Neck: Neck supple. Gross normal ROM               Cardiovascular: Normal rate and regular rhythm.                 Pulmonary/Chest: Effort normal and breath sounds without rales or wheezing.                Abd:  Soft, NT, ND, + BS, no organomegaly               Neurological: Pt is alert. At baseline orientation, motor grossly intact               Skin: Skin is  warm. No rashes, no other new lesions, LE edema - none               Psychiatric: Pt behavior is normal without agitation   Micro: none  Cardiac tracings I have personally interpreted today:  none  Pertinent Radiological findings (summarize): none   Lab Results  Component Value Date   WBC 3.2 (L) 02/13/2023   HGB 13.6 02/13/2023   HCT 41.1 02/13/2023   PLT 196.0 02/13/2023   GLUCOSE 159 (H) 08/31/2023   CHOL 141 08/31/2023   TRIG 58.0 08/31/2023   HDL 50.50 08/31/2023   LDLDIRECT 127.5 04/02/2007   LDLCALC 79 08/31/2023   ALT 20 08/31/2023   AST 20 08/31/2023   NA 138 08/31/2023   K 4.2 08/31/2023   CL 103 08/31/2023   CREATININE 0.87 08/31/2023   BUN 9 08/31/2023   CO2 28 08/31/2023   TSH 1.30 02/13/2023   PSA 2.27 02/13/2023   HGBA1C 8.1 (H) 08/31/2023   MICROALBUR 1.1 02/13/2023   Assessment/Plan:  Howard [name] is a 62 y.o. Black or African American [2] male with  has a past medical history of Allergic rhinitis, cause unspecified (01/15/2012), Allergy, Cholelithiasis (01/20/2018), Diabetes mellitus, DIABETES MELLITUS, TYPE II (05/24/2007), HYPERLIPIDEMIA (10/06/2007), HYPERTENSION (02/06/2008), Lumbar disc disease, PSA, INCREASED (05/11/2009), and Vitamin D deficiency (01/26/2020).  Vitamin D deficiency Last vitamin D Lab Results  Component Value Date   VD25OH 16.04 (L) 08/31/2023   Low, to start oral replacement   Hyperlipidemia Lab Results  Component Value Date   LDLCALC 79 08/31/2023   Mild uncontrolled, pt to continue current statin crestor 40 mg every day with good compliance and lower chol diet, delcines other change   Essential  hypertension BP Readings from Last 3 Encounters:  09/01/23 136/72  02/17/23 122/76  08/08/22 (!) 144/92   Stable, pt to continue medical treatment lisinopril 40 mg qd   Diabetes ([facility]CC) Lab Results  Component Value Date   HGBA1C 8.1 (H) 08/31/2023   unocntrolle pt to continue current medical treatment metformin ER 500 mg -  4 every day, and start ozempic 0.25 weekly   B12 deficiency Lab Results  Component Value Date   VITAMINB12 259 02/13/2023   Low, to start oral replacement - b12 1000 mcg qd  Followup: Return in about 6 months (around 02/29/2024).  Oliver Barre, MD 09/01/2023 12:54 PM  [facility] - [facility]

## 2023-09-01 NOTE — Assessment & Plan Note (Signed)
BP Readings from Last 3 Encounters:  09/01/23 136/72  02/17/23 122/76  08/08/22 (!) 144/92   Stable, pt to continue medical treatment lisinopril 40 mg qd

## 2023-09-01 NOTE — Addendum Note (Signed)
Addended by: Corwin Levins on: 09/01/2023 12:55 PM   Modules accepted: Orders

## 2023-09-01 NOTE — Telephone Encounter (Signed)
Pt states that he needs a refill on his ozempic but there is a prior auth that needs to be giving to his pharmacy in order to receive it. Semaglutide,0.25 or 0.5MG /DOS, (OZEMPIC, 0.25 OR 0.5 MG/DOSE,) 2 MG/1.5ML SOPN

## 2023-09-02 ENCOUNTER — Other Ambulatory Visit (HOSPITAL_COMMUNITY): Payer: Self-pay

## 2023-09-02 ENCOUNTER — Telehealth: Payer: Self-pay

## 2023-09-02 NOTE — Telephone Encounter (Signed)
Pharmacy Patient Advocate Encounter   Received notification from Pt Calls Messages that prior authorization for Ozempic 2mg /72ml is required/requested.   Insurance verification completed.   The patient is insured through [facility] .   Per test claim: PA required; PA submitted to above mentioned insurance via CoverMyMeds Key/confirmation #/EOC [id number] Status is pending

## 2023-09-03 ENCOUNTER — Other Ambulatory Visit (HOSPITAL_COMMUNITY): Payer: Self-pay

## 2023-09-03 NOTE — Telephone Encounter (Signed)
Pharmacy Patient Advocate Encounter  Received notification from [facility] that Prior Authorization for Ozempic (0.25 or 0.5 MG/DOSE) 2MG /3ML pen-injectors has been APPROVED from 09/02/2023 to 09/01/2024. Ran test claim, Copay is $24.99. This test claim was processed through [facility]- copay amounts may vary at other pharmacies due to pharmacy/plan contracts, or as the patient moves through the different stages of their insurance plan.   PA #/Case ID/Reference #: : [id number]

## 2023-09-13 ENCOUNTER — Other Ambulatory Visit: Payer: Self-pay | Admitting: Internal Medicine

## 2023-09-14 ENCOUNTER — Other Ambulatory Visit: Payer: Self-pay

## 2023-09-14 MED ORDER — CETIRIZINE HCL 10 MG PO TABS: 10.0000 mg | ORAL_TABLET | Freq: Every day | ORAL | 3 refills | Status: DC

## 2024-01-12 LAB — HM DIABETES EYE EXAM

## 2024-03-01 ENCOUNTER — Ambulatory Visit: Payer: 59 | Admitting: Internal Medicine

## 2024-03-07 ENCOUNTER — Other Ambulatory Visit (INDEPENDENT_AMBULATORY_CARE_PROVIDER_SITE_OTHER)

## 2024-03-07 DIAGNOSIS — E1165 Type 2 diabetes mellitus with hyperglycemia: Secondary | ICD-10-CM | POA: Diagnosis not present

## 2024-03-07 DIAGNOSIS — E559 Vitamin D deficiency, unspecified: Secondary | ICD-10-CM

## 2024-03-07 DIAGNOSIS — E538 Deficiency of other specified B group vitamins: Secondary | ICD-10-CM | POA: Diagnosis not present

## 2024-03-07 DIAGNOSIS — Z125 Encounter for screening for malignant neoplasm of prostate: Secondary | ICD-10-CM

## 2024-03-07 LAB — LIPID PANEL
Cholesterol: 156 mg/dL (ref 0–200)
HDL: 44.3 mg/dL (ref >39.00)
LDL Cholesterol: 97 mg/dL (ref 0–99)
NonHDL: 111.23
Total CHOL/HDL Ratio: 4
Triglycerides: 73 mg/dL (ref 0.0–149.0)
VLDL: 14.6 mg/dL (ref 0.0–40.0)

## 2024-03-07 LAB — BASIC METABOLIC PANEL WITH GFR
BUN: 11 mg/dL (ref 6–23)
CO2: 26 meq/L (ref 19–32)
Calcium: 9 mg/dL (ref 8.4–10.5)
Chloride: 103 meq/L (ref 96–112)
Creatinine, Ser: 0.93 mg/dL (ref 0.40–1.50)
GFR: 88.34 mL/min (ref >60.00)
Glucose, Bld: 221 mg/dL — ABNORMAL HIGH (ref 70–99)
Potassium: 4.1 meq/L (ref 3.5–5.1)
Sodium: 138 meq/L (ref 135–145)

## 2024-03-07 LAB — URINALYSIS, ROUTINE W REFLEX MICROSCOPIC
Bilirubin Urine: NEGATIVE
Hgb urine dipstick: NEGATIVE
Ketones, ur: NEGATIVE
Leukocytes,Ua: NEGATIVE
Nitrite: NEGATIVE
RBC / HPF: NONE SEEN (ref 0–?)
Specific Gravity, Urine: >=1.03 — AB (ref 1.000–1.030)
Urine Glucose: NEGATIVE
Urobilinogen, UA: 0.2 (ref 0.0–1.0)
pH: 5.5 (ref 5.0–8.0)

## 2024-03-07 LAB — HEPATIC FUNCTION PANEL
ALT: 21 U/L (ref 0–53)
AST: 19 U/L (ref 0–37)
Albumin: 4.3 g/dL (ref 3.5–5.2)
Alkaline Phosphatase: 82 U/L (ref 39–117)
Bilirubin, Direct: 0.1 mg/dL (ref 0.0–0.3)
Total Bilirubin: 0.5 mg/dL (ref 0.2–1.2)
Total Protein: 6.7 g/dL (ref 6.0–8.3)

## 2024-03-07 LAB — CBC WITH DIFFERENTIAL/PLATELET
Basophils Absolute: 0.1 10*3/uL (ref 0.0–0.1)
Basophils Relative: 1.8 % (ref 0.0–3.0)
Eosinophils Absolute: 0 10*3/uL (ref 0.0–0.7)
Eosinophils Relative: 1.4 % (ref 0.0–5.0)
HCT: 40.1 % (ref 39.0–52.0)
Hemoglobin: 13.3 g/dL (ref 13.0–17.0)
Lymphocytes Relative: 30.3 % (ref 12.0–46.0)
Lymphs Abs: 0.9 10*3/uL (ref 0.7–4.0)
MCHC: 33.2 g/dL (ref 30.0–36.0)
MCV: 82.5 fl (ref 78.0–100.0)
Monocytes Absolute: 0.4 10*3/uL (ref 0.1–1.0)
Monocytes Relative: 12 % (ref 3.0–12.0)
Neutro Abs: 1.6 10*3/uL (ref 1.4–7.7)
Neutrophils Relative %: 54.5 % (ref 43.0–77.0)
Platelets: 181 10*3/uL (ref 150.0–400.0)
RBC: 4.86 Mil/uL (ref 4.22–5.81)
RDW: 13.9 % (ref 11.5–15.5)
WBC: 3 10*3/uL — ABNORMAL LOW (ref 4.0–10.5)

## 2024-03-07 LAB — MICROALBUMIN / CREATININE URINE RATIO
Creatinine,U: 272.1 mg/dL
Microalb Creat Ratio: 11.4 mg/g (ref 0.0–30.0)
Microalb, Ur: 3.1 mg/dL — ABNORMAL HIGH (ref 0.0–1.9)

## 2024-03-07 LAB — HEMOGLOBIN A1C: Hgb A1c MFr Bld: 10 % — ABNORMAL HIGH (ref 4.6–6.5)

## 2024-03-08 ENCOUNTER — Ambulatory Visit: Payer: Self-pay | Admitting: Internal Medicine

## 2024-03-08 LAB — TSH: TSH: 2.74 u[IU]/mL (ref 0.35–5.50)

## 2024-03-08 LAB — PSA: PSA: 2.66 ng/mL (ref 0.10–4.00)

## 2024-03-08 LAB — VITAMIN B12: Vitamin B-12: 326 pg/mL (ref 211–911)

## 2024-03-08 LAB — VITAMIN D 25 HYDROXY (VIT D DEFICIENCY, FRACTURES): VITD: 17.81 ng/mL — ABNORMAL LOW (ref 30.00–100.00)

## 2024-03-09 ENCOUNTER — Encounter: Payer: Self-pay | Admitting: Internal Medicine

## 2024-03-09 ENCOUNTER — Ambulatory Visit: Admitting: Internal Medicine

## 2024-03-09 VITALS — BP 134/92 | HR 67 | Temp 98.3°F | Ht 74.0 in | Wt 240.2 lb

## 2024-03-09 DIAGNOSIS — E7800 Pure hypercholesterolemia, unspecified: Secondary | ICD-10-CM

## 2024-03-09 DIAGNOSIS — Z0000 Encounter for general adult medical examination without abnormal findings: Secondary | ICD-10-CM | POA: Diagnosis not present

## 2024-03-09 DIAGNOSIS — E559 Vitamin D deficiency, unspecified: Secondary | ICD-10-CM

## 2024-03-09 DIAGNOSIS — I10 Essential (primary) hypertension: Secondary | ICD-10-CM

## 2024-03-09 DIAGNOSIS — Z1211 Encounter for screening for malignant neoplasm of colon: Secondary | ICD-10-CM

## 2024-03-09 DIAGNOSIS — Z0001 Encounter for general adult medical examination with abnormal findings: Secondary | ICD-10-CM

## 2024-03-09 DIAGNOSIS — E1165 Type 2 diabetes mellitus with hyperglycemia: Secondary | ICD-10-CM

## 2024-03-09 DIAGNOSIS — E538 Deficiency of other specified B group vitamins: Secondary | ICD-10-CM

## 2024-03-09 DIAGNOSIS — Z7985 Long-term (current) use of injectable non-insulin antidiabetic drugs: Secondary | ICD-10-CM

## 2024-03-09 DIAGNOSIS — Z7984 Long term (current) use of oral hypoglycemic drugs: Secondary | ICD-10-CM

## 2024-03-09 MED ORDER — OZEMPIC (0.25 OR 0.5 MG/DOSE) 2 MG/1.5ML ~~LOC~~ SOPN: 0.2500 mg | PEN_INJECTOR | SUBCUTANEOUS | 3 refills | Status: AC

## 2024-03-09 NOTE — Assessment & Plan Note (Signed)
 Lab Results  Component Value Date   VITAMINB12 326 03/07/2024   Stable, cont oral replacement - b12 1000 mcg qd

## 2024-03-09 NOTE — Assessment & Plan Note (Signed)
 BP Readings from Last 3 Encounters:  03/09/24 (!) 134/92  09/01/23 136/72  02/17/23 122/76   Uncontrolled, pt willing to be more compliant with meds,, pt to continue medical treatment lisinopril  40 mg qd

## 2024-03-09 NOTE — Assessment & Plan Note (Signed)
 Last vitamin D  Lab Results  Component Value Date   VD25OH 17.81 (L) 03/07/2024   Low, to restart oral replacement

## 2024-03-09 NOTE — Patient Instructions (Addendum)
 Please take all new medication as prescribed  - the ozempic  0.25 mg weekly, and let us  know in 1 month if you are doing well, so we can increase the dose  Please continue all other medications as before, and take all as prescribed  Please have the pharmacy call with any other refills you may need.  Please continue your efforts at being more active, low cholesterol diet, and weight control.  You are otherwise up to date with prevention measures today.  Please keep your appointments with your specialists as you may have planned  You will be contacted regarding the referral for: colonoscopy  Please make an Appointment to return in 6 months, or sooner if needed, also with Lab Appointment for testing done 3-5 days before at the FIRST FLOOR Lab (so this is for TWO appointments - please see the scheduling desk as you leave)

## 2024-03-09 NOTE — Assessment & Plan Note (Signed)
 Age and sex appropriate education and counseling updated with regular exercise and diet Referrals for preventative services - for colonoscopy (did not complete last yr) Immunizations addressed - for shingrx and tdap at pharmacy Smoking counseling  - none needed Evidence for depression or other mood disorder - none significant Most recent labs reviewed. I have personally reviewed and have noted: 1) the patient's medical and social history 2) The patient's current medications and supplements 3) The patient's height, weight, and BMI have been recorded in the chart

## 2024-03-09 NOTE — Assessment & Plan Note (Signed)
 Lab Results  Component Value Date   LDLCALC 97 03/07/2024   Uncontrolled, pt willing to be more compliant with med, pt to continue current statin crestor  40 mg qd

## 2024-03-09 NOTE — Progress Notes (Addendum)
 Patient ID: Howard [name], male   DOB: 07-06-1961, 63 y.o.   MRN: [id number]         Chief Complaint:: wellness exam and dm, htn, hld, low b12 and vit d       HPI:  Howard [name] is a 63 y.o. male here for wellness exam; due for colonoscopy, for tdap and shingrx at pharmacy, o/w up to date                        Also did not start ozempic  though was approved in nov 2024, and in fact admits to quite a bit of non compliance with current meds , thinking it might not be that important, but willing now to be more compliant with diet and meds.  Remains fairly active, and wt overall up only a few lbs. Pt denies chest pain, increased sob or doe, wheezing, orthopnea, PND, increased LE swelling, palpitations, dizziness or syncope.   Pt denies polydipsia, polyuria, or new focal neuro s/s.    Pt denies fever, wt loss, night sweats, loss of appetite, or other constitutional symptoms     Wt Readings from Last 3 Encounters:  03/09/24 240 lb 3.2 oz (109 kg)  09/01/23 238 lb (108 kg)  02/17/23 234 lb (106.1 kg)   BP Readings from Last 3 Encounters:  03/09/24 (!) 134/92  09/01/23 136/72  02/17/23 122/76   Immunization History  Administered Date(s) Administered   PFIZER(Purple Top)SARS-COV-2 Vaccination 01/11/2020, 02/01/2020, 08/13/2020, 06/21/2021   Health Maintenance Due  Topic Date Due   DTaP/Tdap/Td (1 - Tdap) Never done   Zoster Vaccines- Shingrix (1 of 2) Never done   Colonoscopy  07/08/2022      Past Medical History:  Diagnosis Date   Allergic rhinitis, cause unspecified 01/15/2012   Allergy    Cholelithiasis 01/20/2018   Diabetes mellitus    DIABETES MELLITUS, TYPE II 05/24/2007   Qualifier: Diagnosis of  By: Dance CMA (AAMA), Versie Gores 10/06/2007   Qualifier: Diagnosis of  By: Georgia Kipper MD, Oakley Bellman    HYPERTENSION 02/06/2008   Qualifier: Diagnosis of  By: Georgia Kipper MD, Aleksei V    Lumbar disc disease    s/p surgury approx 1992   PSA, INCREASED 05/11/2009   Qualifier:  Diagnosis of  By: Rochelle Chu MD, Randa Burton.    Vitamin D  deficiency 01/26/2020   Past Surgical History:  Procedure Laterality Date   BACK SURGERY  1989   Lumbar/Ruptured Disk   HERNIA REPAIR  may 2012   left inguinal   KNEE ARTHROSCOPY Right 2014   NASAL SEPTUM SURGERY      reports that he has never smoked. He has never used smokeless tobacco. He reports current alcohol use. He reports that he does not use drugs. family history includes Cancer in his mother; Diabetes in his father and mother; Hypertension in his father and mother. No Known Allergies Current Outpatient Medications on File Prior to Visit  Medication Sig Dispense Refill   Blood Glucose Monitoring Suppl (ONE TOUCH ULTRA 2) W/DEVICE KIT Use as directed daily 1 each 0   glucose blood (ONE TOUCH ULTRA TEST) test strip Use as instructed once daily. Diagnosis 250.0 100 each 12   Lancets MISC 1 application. by Does not apply route daily. 100 each 12   lisinopril  (ZESTRIL ) 40 MG tablet Take 1 tablet by mouth once daily 90 tablet 3   meloxicam  (MOBIC ) 15 MG tablet Take 1 tablet (15 mg  total) by mouth daily as needed for pain. 90 tablet 3   metFORMIN  (GLUCOPHAGE -XR) 500 MG 24 hr tablet TAKE 4 TABLETS BY MOUTH IN THE MORNING 360 tablet 3   rosuvastatin  (CRESTOR ) 40 MG tablet Take 1 tablet by mouth once daily 90 tablet 3   No current facility-administered medications on file prior to visit.        ROS:  All others reviewed and negative.  Objective        PE:  BP (!) 134/92 (BP Location: Left Arm, Patient Position: Sitting, Cuff Size: Normal)   Pulse 67   Temp 98.3 F (36.8 C) (Oral)   Ht 6\' 2"  (1.88 m)   Wt 240 lb 3.2 oz (109 kg)   SpO2 97%   BMI 30.84 kg/m                 Constitutional: Pt appears in NAD               HENT: Head: NCAT.                Right Ear: External ear normal.                 Left Ear: External ear normal.                Eyes: . Pupils are equal, round, and reactive to light. Conjunctivae and EOM are  normal               Nose: without d/c or deformity               Neck: Neck supple. Gross normal ROM               Cardiovascular: Normal rate and regular rhythm.                 Pulmonary/Chest: Effort normal and breath sounds without rales or wheezing.                Abd:  Soft, NT, ND, + BS, no organomegaly               Neurological: Pt is alert. At baseline orientation, motor grossly intact               Skin: Skin is warm. No rashes, no other new lesions, LE edema - none               Psychiatric: Pt behavior is normal without agitation   Micro: none  Cardiac tracings I have personally interpreted today:  none  Pertinent Radiological findings (summarize): none   Lab Results  Component Value Date   WBC 3.0 (L) 03/07/2024   HGB 13.3 03/07/2024   HCT 40.1 03/07/2024   PLT 181.0 03/07/2024   GLUCOSE 221 (H) 03/07/2024   CHOL 156 03/07/2024   TRIG 73.0 03/07/2024   HDL 44.30 03/07/2024   LDLDIRECT 127.5 04/02/2007   LDLCALC 97 03/07/2024   ALT 21 03/07/2024   AST 19 03/07/2024   NA 138 03/07/2024   K 4.1 03/07/2024   CL 103 03/07/2024   CREATININE 0.93 03/07/2024   BUN 11 03/07/2024   CO2 26 03/07/2024   TSH 2.74 03/07/2024   PSA 2.66 03/07/2024   HGBA1C 10.0 (H) 03/07/2024   MICROALBUR 3.1 (H) 03/07/2024   Assessment/Plan:  Howard [name] is a 63 y.o. Black or African American [2] male with  has a past medical history of Allergic rhinitis, cause unspecified (01/15/2012), Allergy, Cholelithiasis (  01/20/2018), Diabetes mellitus, DIABETES MELLITUS, TYPE II (05/24/2007), HYPERLIPIDEMIA (10/06/2007), HYPERTENSION (02/06/2008), Lumbar disc disease, PSA, INCREASED (05/11/2009), and Vitamin D  deficiency (01/26/2020).  Encounter for well adult exam with abnormal findings Age and sex appropriate education and counseling updated with regular exercise and diet Referrals for preventative services - for colonoscopy (did not complete last yr) Immunizations addressed - for shingrx and tdap  at pharmacy Smoking counseling  - none needed Evidence for depression or other mood disorder - none significant Most recent labs reviewed. I have personally reviewed and have noted: 1) the patient's medical and social history 2) The patient's current medications and supplements 3) The patient's height, weight, and BMI have been recorded in the chart   Diabetes [facility]) Lab Results  Component Value Date   HGBA1C 10.0 (H) 03/07/2024   Severe uncontrolled, and pt now willing for better compliance, pt to continue current medical treatment metformin  ER 500 mg - 4 every day, and will add ozempic  0.25 mg weekly with intent to titrate   Hyperlipidemia Lab Results  Component Value Date   LDLCALC 97 03/07/2024   Uncontrolled, pt willing to be more compliant with med, pt to continue current statin crestor  40 mg qd   Essential hypertension BP Readings from Last 3 Encounters:  03/09/24 (!) 134/92  09/01/23 136/72  02/17/23 122/76   Uncontrolled, pt willing to be more compliant with meds,, pt to continue medical treatment lisinopril  40 mg qd   Vitamin D  deficiency Last vitamin D  Lab Results  Component Value Date   VD25OH 17.81 (L) 03/07/2024   Low, to restart oral replacement   B12 deficiency Lab Results  Component Value Date   VITAMINB12 326 03/07/2024   Stable, cont oral replacement - b12 1000 mcg qd  Followup: No follow-ups on file.  Rosalia Colonel, MD 03/09/2024 1:14 PM [facility]

## 2024-03-09 NOTE — Assessment & Plan Note (Signed)
 Lab Results  Component Value Date   HGBA1C 10.0 (H) 03/07/2024   Severe uncontrolled, and pt now willing for better compliance, pt to continue current medical treatment metformin  ER 500 mg - 4 every day, and will add ozempic  0.25 mg weekly with intent to titrate

## 2024-04-04 ENCOUNTER — Encounter: Payer: Self-pay | Admitting: Internal Medicine

## 2024-05-20 ENCOUNTER — Encounter

## 2024-06-16 ENCOUNTER — Encounter: Admitting: Internal Medicine

## 2024-06-21 ENCOUNTER — Encounter: Payer: Self-pay | Admitting: Sports Medicine

## 2024-07-01 ENCOUNTER — Ambulatory Visit (AMBULATORY_SURGERY_CENTER): Admitting: *Deleted

## 2024-07-01 VITALS — Ht 74.0 in | Wt 240.0 lb

## 2024-07-01 DIAGNOSIS — Z860100 Personal history of colon polyps, unspecified: Secondary | ICD-10-CM

## 2024-07-01 MED ORDER — NA SULFATE-K SULFATE-MG SULF 17.5-3.13-1.6 GM/177ML PO SOLN: 1.0000 | Freq: Once | ORAL | 0 refills | Status: AC

## 2024-07-01 NOTE — Progress Notes (Signed)
 Pre visit completed over telephone. Instructions forwarded through MyChart and mailed to home address    No egg or soy allergy known to patient  No issues known to pt with past sedation with any surgeries or procedures Patient denies ever being told they had issues or difficulty with intubation  No FH of Malignant Hyperthermia Pt is not on diet pills Pt is not on  home 02  Pt is not on blood thinners  Pt denies issues with constipation  No A fib or A flutter Have any cardiac testing pending-- NO Pt instructed to use [email or web address] or GoodRx for a price reduction on prep     Patient has not yet started Ozempic  injections. Advised him that he will need to hold it 7 full days prior to colonoscopy if he decides to begin taking it before colonoscopy.  Chart reviewed by DOROTHA Schillings, CRNA

## 2024-07-20 ENCOUNTER — Encounter: Payer: Self-pay | Admitting: Internal Medicine

## 2024-07-29 ENCOUNTER — Ambulatory Visit: Admitting: Internal Medicine

## 2024-07-29 ENCOUNTER — Encounter: Payer: Self-pay | Admitting: Internal Medicine

## 2024-07-29 VITALS — BP 136/87 | HR 68 | Temp 97.4°F | Resp 16 | Ht 74.0 in | Wt 240.0 lb

## 2024-07-29 DIAGNOSIS — D122 Benign neoplasm of ascending colon: Secondary | ICD-10-CM | POA: Diagnosis not present

## 2024-07-29 DIAGNOSIS — K6389 Other specified diseases of intestine: Secondary | ICD-10-CM | POA: Diagnosis not present

## 2024-07-29 DIAGNOSIS — Z1211 Encounter for screening for malignant neoplasm of colon: Secondary | ICD-10-CM | POA: Diagnosis present

## 2024-07-29 DIAGNOSIS — Z860101 Personal history of adenomatous and serrated colon polyps: Secondary | ICD-10-CM | POA: Diagnosis not present

## 2024-07-29 DIAGNOSIS — K635 Polyp of colon: Secondary | ICD-10-CM

## 2024-07-29 DIAGNOSIS — K648 Other hemorrhoids: Secondary | ICD-10-CM

## 2024-07-29 DIAGNOSIS — K5730 Diverticulosis of large intestine without perforation or abscess without bleeding: Secondary | ICD-10-CM

## 2024-07-29 DIAGNOSIS — D123 Benign neoplasm of transverse colon: Secondary | ICD-10-CM

## 2024-07-29 DIAGNOSIS — Z860100 Personal history of colon polyps, unspecified: Secondary | ICD-10-CM

## 2024-07-29 MED ORDER — SODIUM CHLORIDE 0.9 % IV SOLN: 500.0000 mL | Freq: Once | INTRAVENOUS | Status: DC

## 2024-07-29 NOTE — Progress Notes (Signed)
 GASTROENTEROLOGY PROCEDURE H&P NOTE   Primary Care Physician: Norleen Lynwood ORN, MD    Reason for Procedure:  History of adenomatous and sessile serrated polyps  Plan:    Surveillance colonoscopy  Patient is appropriate for endoscopic procedure(s) in the ambulatory ([facility]) setting.  The nature of the procedure, as well as the risks, benefits, and alternatives were carefully and thoroughly reviewed with the patient. Ample time for discussion and questions allowed. The patient understood, was satisfied, and agreed to proceed.     HPI: Howard [name] is a 63 y.o. male who presents for surveillance colonoscopy.  Medical history as below.  Tolerated the prep.  No recent chest pain or shortness of breath.  No abdominal pain today.  Past Medical History:  Diagnosis Date   Allergic rhinitis, cause unspecified 01/15/2012   Allergy    Cholelithiasis 01/20/2018   Diabetes mellitus    DIABETES MELLITUS, TYPE II 05/24/2007   Qualifier: Diagnosis of  By: Dance CMA (AAMA), Luke MARINA 10/06/2007   Qualifier: Diagnosis of  By: Garald MD, Karlynn GAILS    HYPERTENSION 02/06/2008   Qualifier: Diagnosis of  By: Garald MD, Aleksei V    Lumbar disc disease    s/p surgury approx 1992   PSA, INCREASED 05/11/2009   Qualifier: Diagnosis of  By: Joshua MD, Debby CROME.    Vitamin D  deficiency 01/26/2020    Past Surgical History:  Procedure Laterality Date   BACK SURGERY  1989   Lumbar/Ruptured Disk   HERNIA REPAIR  may 2012   left inguinal   KNEE ARTHROSCOPY Right 2014   NASAL SEPTUM SURGERY      Prior to Admission medications   Medication Sig Start Date End Date Taking? Authorizing Provider  lisinopril  (ZESTRIL ) 40 MG tablet Take 1 tablet by mouth once daily 08/24/23  Yes Norleen Lynwood ORN, MD  metFORMIN  (GLUCOPHAGE -XR) 500 MG 24 hr tablet TAKE 4 TABLETS BY MOUTH IN THE MORNING 08/24/23  Yes Norleen Lynwood ORN, MD  rosuvastatin  (CRESTOR ) 40 MG tablet Take 1 tablet by mouth once daily 09/14/23  Yes  Norleen Lynwood ORN, MD  Blood Glucose Monitoring Suppl (ONE TOUCH ULTRA 2) W/DEVICE KIT Use as directed daily 08/16/15   Norleen Lynwood ORN, MD  glucose blood (ONE TOUCH ULTRA TEST) test strip Use as instructed once daily. Diagnosis 250.0 02/07/22   Norleen Lynwood ORN, MD  Lancets MISC 1 application. by Does not apply route daily. 02/07/22   Norleen Lynwood ORN, MD  meloxicam  (MOBIC ) 15 MG tablet Take 1 tablet (15 mg total) by mouth daily as needed for pain. 02/07/22   Norleen Lynwood ORN, MD  OZEMPIC , 0.25 OR 0.5 MG/DOSE, 2 MG/3ML SOPN SMARTSIG:0.25 Milligram(s) SUB-Q Once a Week Patient not taking: No sig reported 03/09/24   [provider]  Semaglutide ,0.25 or 0.5MG /DOS, (OZEMPIC , 0.25 OR 0.5 MG/DOSE,) 2 MG/1.5ML SOPN Inject 0.25 mg into the skin once a week. Patient not taking: No sig reported 03/09/24   Norleen Lynwood ORN, MD    Current Outpatient Medications  Medication Sig Dispense Refill   lisinopril  (ZESTRIL ) 40 MG tablet Take 1 tablet by mouth once daily 90 tablet 3   metFORMIN  (GLUCOPHAGE -XR) 500 MG 24 hr tablet TAKE 4 TABLETS BY MOUTH IN THE MORNING 360 tablet 3   rosuvastatin  (CRESTOR ) 40 MG tablet Take 1 tablet by mouth once daily 90 tablet 3   Blood Glucose Monitoring Suppl (ONE TOUCH ULTRA 2) W/DEVICE KIT Use as directed daily 1 each 0  glucose blood (ONE TOUCH ULTRA TEST) test strip Use as instructed once daily. Diagnosis 250.0 100 each 12   Lancets MISC 1 application. by Does not apply route daily. 100 each 12   meloxicam  (MOBIC ) 15 MG tablet Take 1 tablet (15 mg total) by mouth daily as needed for pain. 90 tablet 3   OZEMPIC , 0.25 OR 0.5 MG/DOSE, 2 MG/3ML SOPN SMARTSIG:0.25 Milligram(s) SUB-Q Once a Week (Patient not taking: No sig reported)     Semaglutide ,0.25 or 0.5MG /DOS, (OZEMPIC , 0.25 OR 0.5 MG/DOSE,) 2 MG/1.5ML SOPN Inject 0.25 mg into the skin once a week. (Patient not taking: No sig reported) 6 mL 3   Current Facility-Administered Medications  Medication Dose Route Frequency Provider Last  Rate Last Admin   0.9 %  sodium chloride  infusion  500 mL Intravenous Once Ulice Follett, Gordy HERO, MD        Allergies as of 07/29/2024   (Not on File)    Family History  Problem Relation Age of Onset   Hypertension Mother    Diabetes Mother    Cancer Mother    Hypertension Father    Diabetes Father    Colon cancer Neg Hx    Esophageal cancer Neg Hx    Rectal cancer Neg Hx    Stomach cancer Neg Hx     Social History   Socioeconomic History   Marital status: Married    Spouse name: Not on file   Number of children: Not on file   Years of education: Not on file   Highest education level: Not on file  Occupational History   Not on file  Tobacco Use   Smoking status: Never   Smokeless tobacco: Never  Vaping Use   Vaping status: Never Used  Substance and Sexual Activity   Alcohol use: Yes    Comment: very rare    Drug use: No   Sexual activity: Not on file  Other Topics Concern   Not on file  Social History Narrative   Not on file   Social Drivers of Health   Financial Resource Strain: Not on file  Food Insecurity: Not on file  Transportation Needs: Not on file  Physical Activity: Not on file  Stress: Not on file  Social Connections: Not on file  Intimate Partner Violence: Not on file    Physical Exam: Vital signs in last 24 hours: @BP  (!) 152/92   Pulse 70   Temp (!) 97.4 F (36.3 C)   Ht 6' 2 (1.88 m)   Wt 240 lb (108.9 kg)   SpO2 100%   BMI 30.81 kg/m  GEN: NAD EYE: Sclerae anicteric ENT: MMM CV: Non-tachycardic Pulm: CTA b/l GI: Soft, NT/ND NEURO:  Alert & Oriented x 3   Gordy Starch, MD [facility]  07/29/2024 9:34 AM

## 2024-07-29 NOTE — Progress Notes (Signed)
 Called to room to assist during endoscopic procedure.  Patient ID and intended procedure confirmed with present staff. Received instructions for my participation in the procedure from the performing physician.

## 2024-07-29 NOTE — Patient Instructions (Signed)
-  Handout on polyps, diverticulosis and hemorrhoids provided -await pathology results -repeat colonoscopy for surveillance recommended. Date to be determined when pathology result become available   -Continue present medications   YOU HAD AN ENDOSCOPIC PROCEDURE TODAY AT THE [facility]:   Refer to the procedure report that was given to you for any specific questions about what was found during the examination.  If the procedure report does not answer your questions, please call your gastroenterologist to clarify.  If you requested that your care partner not be given the details of your procedure findings, then the procedure report has been included in a sealed envelope for you to review at your convenience later.  YOU SHOULD EXPECT: Some feelings of bloating in the abdomen. Passage of more gas than usual.  Walking can help get rid of the air that was put into your GI tract during the procedure and reduce the bloating. If you had a lower endoscopy (such as a colonoscopy or flexible sigmoidoscopy) you may notice spotting of blood in your stool or on the toilet paper. If you underwent a bowel prep for your procedure, you may not have a normal bowel movement for a few days.  Please Note:  You might notice some irritation and congestion in your nose or some drainage.  This is from the oxygen used during your procedure.  There is no need for concern and it should clear up in a day or so.  SYMPTOMS TO REPORT IMMEDIATELY:  Following lower endoscopy (colonoscopy or flexible sigmoidoscopy):  Excessive amounts of blood in the stool  Significant tenderness or worsening of abdominal pains  Swelling of the abdomen that is new, acute  Fever of 100F or higher   For urgent or emergent issues, a gastroenterologist can be reached at any hour by calling [phone]. Do not use MyChart messaging for urgent concerns.    DIET:  We do recommend a small meal at first, but then you may proceed to your  regular diet.  Drink plenty of fluids but you should avoid alcoholic beverages for 24 hours.  ACTIVITY:  You should plan to take it easy for the rest of today and you should NOT DRIVE or use heavy machinery until tomorrow (because of the sedation medicines used during the test).    FOLLOW UP: Our staff will call the number listed on your records the next business day following your procedure.  We will call around 7:15- 8:00 am to check on you and address any questions or concerns that you may have regarding the information given to you following your procedure. If we do not reach you, we will leave a message.     If any biopsies were taken you will be contacted by phone or by letter within the next 1-3 weeks.  Please call us at [phone] if you have not heard about the biopsies in 3 weeks.    SIGNATURES/CONFIDENTIALITY: You and/or your care partner have signed paperwork which will be entered into your electronic medical record.  These signatures attest to the fact that that the information above on your After Visit Summary has been reviewed and is understood.  Full responsibility of the confidentiality of this discharge information lies with you and/or your care-partner.

## 2024-07-29 NOTE — Progress Notes (Signed)
 Pt's states no medical or surgical changes since previsit or office visit.

## 2024-07-29 NOTE — Progress Notes (Signed)
 To pacu, VSS. Report to [email or web address]

## 2024-07-29 NOTE — Op Note (Signed)
 [facility] Patient Name: Howard [name] Procedure Date: 07/29/2024 9:36 AM MRN: [id number] Endoscopist: Gordy CHRISTELLA Starch , MD, [id number] Age: 63 Referring MD:  Date of Birth: 23-Aug-1961 Gender: Male Account #: [id number] Procedure:                Colonoscopy Indications:              High risk colon cancer surveillance: Personal                            history of adenoma (10 mm or greater in size), Last                            colonoscopy: September 2018 (TA x1, SSP x 1); June                            2014 (TA x 1 = 1 cm) Medicines:                Monitored Anesthesia Care Procedure:                Pre-Anesthesia Assessment:                           - Prior to the procedure, a History and Physical                            was performed, and patient medications and                            allergies were reviewed. The patient's tolerance of                            previous anesthesia was also reviewed. The risks                            and benefits of the procedure and the sedation                            options and risks were discussed with the patient.                            All questions were answered, and informed consent                            was obtained. Prior Anticoagulants: The patient has                            taken no anticoagulant or antiplatelet agents. ASA                            Grade Assessment: II - A patient with mild systemic                            disease. After reviewing the risks and benefits,  the patient was deemed in satisfactory condition to                            undergo the procedure.                           After obtaining informed consent, the colonoscope                            was passed under direct vision. Throughout the                            procedure, the patient's blood pressure, pulse, and                            oxygen saturations were monitored  continuously. The                            CF [id number] #[id number] was introduced through the anus                            and advanced to the terminal ileum. The colonoscopy                            was performed without difficulty. The patient                            tolerated the procedure well. The quality of the                            bowel preparation was good. The terminal ileum,                            ileocecal valve, appendiceal orifice, and rectum                            were photographed. Scope In: 9:46:13 AM Scope Out: 10:02:55 AM Scope Withdrawal Time: 0 hours 14 minutes 2 seconds  Total Procedure Duration: 0 hours 16 minutes 42 seconds  Findings:                 The digital rectal exam was normal.                           The terminal ileum appeared normal.                           Three sessile polyps were found in the ascending                            colon. The polyps were 2 to 5 mm in size. These                            polyps were removed with a cold snare. Resection  and retrieval were complete.                           A 4 mm polyp was found in the transverse colon. The                            polyp was sessile. The polyp was removed with a                            cold snare. Resection and retrieval were complete.                           Multiple small-mouthed diverticula were found in                            the sigmoid colon and descending colon.                           Internal hemorrhoids were found during                            retroflexion. The hemorrhoids were medium-sized. Complications:            No immediate complications. Estimated Blood Loss:     Estimated blood loss: none. Impression:               - The examined portion of the ileum was normal.                           - Three 2 to 5 mm polyps in the ascending colon,                            removed with a cold snare. Resected and  retrieved.                           - One 4 mm polyp in the transverse colon, removed                            with a cold snare. Resected and retrieved.                           - Mild diverticulosis in the sigmoid colon and in                            the descending colon.                           - Internal hemorrhoids. Recommendation:           - Patient has a contact number available for                            emergencies. The signs and symptoms of potential  delayed complications were discussed with the                            patient. Return to normal activities tomorrow.                            Written discharge instructions were provided to the                            patient.                           - Resume previous diet.                           - Continue present medications.                           - Await pathology results.                           - Repeat colonoscopy is recommended for                            surveillance. The colonoscopy date will be                            determined after pathology results from today's                            exam become available for review. Gordy CHRISTELLA Starch, MD 07/29/2024 10:07:38 AM This report has been signed electronically.

## 2024-08-01 ENCOUNTER — Telehealth: Payer: Self-pay

## 2024-08-01 NOTE — Telephone Encounter (Signed)
  Follow up Call-     07/29/2024    8:39 AM  Call back number  Post procedure Call Back phone  # [phone]  Permission to leave phone message Yes     Patient questions:  Do you have a fever, pain , or abdominal swelling? No. Pain Score  0 *  Have you tolerated food without any problems? Yes.    Have you been able to return to your normal activities? Yes.    Do you have any questions about your discharge instructions: Diet   No. Medications  No. Follow up visit  No.  Do you have questions or concerns about your Care? No.  Actions: * If pain score is 4 or above: No action needed, pain <4.

## 2024-08-03 LAB — SURGICAL PATHOLOGY

## 2024-08-04 ENCOUNTER — Ambulatory Visit: Payer: Self-pay | Admitting: Internal Medicine

## 2024-08-08 ENCOUNTER — Other Ambulatory Visit (HOSPITAL_COMMUNITY): Payer: Self-pay

## 2024-08-16 ENCOUNTER — Other Ambulatory Visit (HOSPITAL_COMMUNITY): Payer: Self-pay

## 2024-08-30 ENCOUNTER — Other Ambulatory Visit (INDEPENDENT_AMBULATORY_CARE_PROVIDER_SITE_OTHER)

## 2024-08-30 DIAGNOSIS — E1165 Type 2 diabetes mellitus with hyperglycemia: Secondary | ICD-10-CM | POA: Diagnosis not present

## 2024-08-30 DIAGNOSIS — E559 Vitamin D deficiency, unspecified: Secondary | ICD-10-CM

## 2024-08-30 DIAGNOSIS — E538 Deficiency of other specified B group vitamins: Secondary | ICD-10-CM

## 2024-08-30 LAB — LIPID PANEL
Cholesterol: 125 mg/dL (ref 0–200)
HDL: 50.3 mg/dL (ref >39.00)
LDL Cholesterol: 65 mg/dL (ref 0–99)
NonHDL: 74.25
Total CHOL/HDL Ratio: 2
Triglycerides: 48 mg/dL (ref 0.0–149.0)
VLDL: 9.6 mg/dL (ref 0.0–40.0)

## 2024-08-30 LAB — BASIC METABOLIC PANEL WITH GFR
BUN: 12 mg/dL (ref 6–23)
CO2: 30 meq/L (ref 19–32)
Calcium: 9.2 mg/dL (ref 8.4–10.5)
Chloride: 105 meq/L (ref 96–112)
Creatinine, Ser: 0.82 mg/dL (ref 0.40–1.50)
GFR: 94.18 mL/min (ref >60.00)
Glucose, Bld: 125 mg/dL — ABNORMAL HIGH (ref 70–99)
Potassium: 4.2 meq/L (ref 3.5–5.1)
Sodium: 142 meq/L (ref 135–145)

## 2024-08-30 LAB — VITAMIN B12: Vitamin B-12: 356 pg/mL (ref 211–911)

## 2024-08-30 LAB — HEPATIC FUNCTION PANEL
ALT: 21 U/L (ref 0–53)
AST: 21 U/L (ref 0–37)
Albumin: 4.3 g/dL (ref 3.5–5.2)
Alkaline Phosphatase: 77 U/L (ref 39–117)
Bilirubin, Direct: 0.1 mg/dL (ref 0.0–0.3)
Total Bilirubin: 0.4 mg/dL (ref 0.2–1.2)
Total Protein: 6.6 g/dL (ref 6.0–8.3)

## 2024-08-30 LAB — VITAMIN D 25 HYDROXY (VIT D DEFICIENCY, FRACTURES): VITD: 15.04 ng/mL — ABNORMAL LOW (ref 30.00–100.00)

## 2024-08-30 LAB — HEMOGLOBIN A1C: Hgb A1c MFr Bld: 8 % — ABNORMAL HIGH (ref 4.6–6.5)

## 2024-09-09 ENCOUNTER — Encounter: Payer: Self-pay | Admitting: Internal Medicine

## 2024-09-09 ENCOUNTER — Ambulatory Visit: Admitting: Internal Medicine

## 2024-09-09 VITALS — BP 144/86 | HR 65 | Temp 98.3°F | Ht 74.0 in | Wt 238.0 lb

## 2024-09-09 DIAGNOSIS — Z7985 Long-term (current) use of injectable non-insulin antidiabetic drugs: Secondary | ICD-10-CM

## 2024-09-09 DIAGNOSIS — E1165 Type 2 diabetes mellitus with hyperglycemia: Secondary | ICD-10-CM

## 2024-09-09 DIAGNOSIS — E559 Vitamin D deficiency, unspecified: Secondary | ICD-10-CM | POA: Diagnosis not present

## 2024-09-09 DIAGNOSIS — I10 Essential (primary) hypertension: Secondary | ICD-10-CM | POA: Diagnosis not present

## 2024-09-09 DIAGNOSIS — E538 Deficiency of other specified B group vitamins: Secondary | ICD-10-CM | POA: Diagnosis not present

## 2024-09-09 DIAGNOSIS — Z125 Encounter for screening for malignant neoplasm of prostate: Secondary | ICD-10-CM | POA: Diagnosis not present

## 2024-09-09 DIAGNOSIS — E7800 Pure hypercholesterolemia, unspecified: Secondary | ICD-10-CM

## 2024-09-09 DIAGNOSIS — Z7984 Long term (current) use of oral hypoglycemic drugs: Secondary | ICD-10-CM

## 2024-09-09 MED ORDER — METFORMIN HCL ER 500 MG PO TB24: ORAL_TABLET | ORAL | 3 refills | Status: AC

## 2024-09-09 MED ORDER — ROSUVASTATIN CALCIUM 40 MG PO TABS: 40.0000 mg | ORAL_TABLET | Freq: Every day | ORAL | 3 refills | Status: AC

## 2024-09-09 MED ORDER — LISINOPRIL 40 MG PO TABS: 40.0000 mg | ORAL_TABLET | Freq: Every day | ORAL | 3 refills | Status: AC

## 2024-09-09 MED ORDER — OZEMPIC (0.25 OR 0.5 MG/DOSE) 2 MG/3ML ~~LOC~~ SOPN: PEN_INJECTOR | SUBCUTANEOUS | 3 refills | Status: AC

## 2024-09-09 MED ORDER — AMLODIPINE BESYLATE 5 MG PO TABS: 5.0000 mg | ORAL_TABLET | Freq: Every day | ORAL | 3 refills | Status: AC

## 2024-09-09 NOTE — Assessment & Plan Note (Signed)
 BP Readings from Last 3 Encounters:  09/09/24 (!) 144/86  07/29/24 136/87  03/09/24 (!) 134/92   uncontrolled, pt to continue medical treatment lisinopril  40 mg every day, and add amlodipine  5 mg qd

## 2024-09-09 NOTE — Assessment & Plan Note (Signed)
 Last vitamin D  Lab Results  Component Value Date   VD25OH 15.04 (L) 08/30/2024   Low, to start oral replacement

## 2024-09-09 NOTE — Assessment & Plan Note (Signed)
 Lab Results  Component Value Date   VITAMINB12 356 08/30/2024   Stable, cont oral replacement - b12 1000 mcg qd

## 2024-09-09 NOTE — Progress Notes (Signed)
 Patient ID: Howard [name], male   DOB: June 22, 1961, 63 y.o.   MRN: [id number]        Chief Complaint: follow up HTN, HLD, DM, low vit d and b12       HPI:  Howard [name] is a 63 y.o. male here overall doing ok, Pt denies chest pain, increased sob or doe, wheezing, orthopnea, PND, increased LE swelling, palpitations, dizziness or syncope.   Pt denies polydipsia, polyuria, or new focal neuro s/s.    Pt denies fever, wt loss, night sweats, loss of appetite, or other constitutional symptoms  Never started the ozempic  last visit, but now willing to start.   BP has been mildly elevated at home recently as well.          Wt Readings from Last 3 Encounters:  09/09/24 238 lb (108 kg)  07/29/24 240 lb (108.9 kg)  07/01/24 240 lb (108.9 kg)   BP Readings from Last 3 Encounters:  09/09/24 (!) 144/86  07/29/24 136/87  03/09/24 (!) 134/92         Past Medical History:  Diagnosis Date   Allergic rhinitis, cause unspecified 01/15/2012   Allergy    Cholelithiasis 01/20/2018   Diabetes mellitus    DIABETES MELLITUS, TYPE II 05/24/2007   Qualifier: Diagnosis of  By: Dance CMA (AAMA), Luke MARINA 10/06/2007   Qualifier: Diagnosis of  By: Garald MD, Karlynn GAILS    HYPERTENSION 02/06/2008   Qualifier: Diagnosis of  By: Garald MD, Aleksei V    Lumbar disc disease    s/p surgury approx 1992   PSA, INCREASED 05/11/2009   Qualifier: Diagnosis of  By: Joshua MD, Debby CROME.    Vitamin D  deficiency 01/26/2020   Past Surgical History:  Procedure Laterality Date   BACK SURGERY  1989   Lumbar/Ruptured Disk   HERNIA REPAIR  may 2012   left inguinal   KNEE ARTHROSCOPY Right 2014   NASAL SEPTUM SURGERY      reports that he has never smoked. He has never used smokeless tobacco. He reports current alcohol use. He reports that he does not use drugs. family history includes Cancer in his mother; Diabetes in his father and mother; Hypertension in his father and mother. Not on File Current Outpatient  Medications on File Prior to Visit  Medication Sig Dispense Refill   Blood Glucose Monitoring Suppl (ONE TOUCH ULTRA 2) W/DEVICE KIT Use as directed daily 1 each 0   glucose blood (ONE TOUCH ULTRA TEST) test strip Use as instructed once daily. Diagnosis 250.0 100 each 12   Lancets MISC 1 application. by Does not apply route daily. 100 each 12   meloxicam  (MOBIC ) 15 MG tablet Take 1 tablet (15 mg total) by mouth daily as needed for pain. 90 tablet 3   Semaglutide ,0.25 or 0.5MG /DOS, (OZEMPIC , 0.25 OR 0.5 MG/DOSE,) 2 MG/1.5ML SOPN Inject 0.25 mg into the skin once a week. (Patient not taking: No sig reported) 6 mL 3   No current facility-administered medications on file prior to visit.        ROS:  All others reviewed and negative.  Objective        PE:  BP (!) 144/86 (BP Location: Left Arm, Patient Position: Sitting, Cuff Size: Normal)   Pulse 65   Temp 98.3 F (36.8 C) (Oral)   Ht 6' 2 (1.88 m)   Wt 238 lb (108 kg)   SpO2 97%   BMI 30.56 kg/m  Constitutional: Pt appears in NAD               HENT: Head: NCAT.                Right Ear: External ear normal.                 Left Ear: External ear normal.                Eyes: . Pupils are equal, round, and reactive to light. Conjunctivae and EOM are normal               Nose: without d/c or deformity               Neck: Neck supple. Gross normal ROM               Cardiovascular: Normal rate and regular rhythm.                 Pulmonary/Chest: Effort normal and breath sounds without rales or wheezing.                Abd:  Soft, NT, ND, + BS, no organomegaly               Neurological: Pt is alert. At baseline orientation, motor grossly intact               Skin: Skin is warm. No rashes, no other new lesions, LE edema - none               Psychiatric: Pt behavior is normal without agitation   Micro: none  Cardiac tracings I have personally interpreted today:  none  Pertinent Radiological findings (summarize): none    Lab Results  Component Value Date   WBC 3.0 (L) 03/07/2024   HGB 13.3 03/07/2024   HCT 40.1 03/07/2024   PLT 181.0 03/07/2024   GLUCOSE 125 (H) 08/30/2024   CHOL 125 08/30/2024   TRIG 48.0 08/30/2024   HDL 50.30 08/30/2024   LDLDIRECT 127.5 04/02/2007   LDLCALC 65 08/30/2024   ALT 21 08/30/2024   AST 21 08/30/2024   NA 142 08/30/2024   K 4.2 08/30/2024   CL 105 08/30/2024   CREATININE 0.82 08/30/2024   BUN 12 08/30/2024   CO2 30 08/30/2024   TSH 2.74 03/07/2024   PSA 2.66 03/07/2024   HGBA1C 8.0 (H) 08/30/2024   MICROALBUR 3.1 (H) 03/07/2024   Assessment/Plan:  Howard [name] is a 63 y.o. Black or African American [2] male with  has a past medical history of Allergic rhinitis, cause unspecified (01/15/2012), Allergy, Cholelithiasis (01/20/2018), Diabetes mellitus, DIABETES MELLITUS, TYPE II (05/24/2007), HYPERLIPIDEMIA (10/06/2007), HYPERTENSION (02/06/2008), Lumbar disc disease, PSA, INCREASED (05/11/2009), and Vitamin D  deficiency (01/26/2020).  Vitamin D  deficiency Last vitamin D  Lab Results  Component Value Date   VD25OH 15.04 (L) 08/30/2024   Low, to start oral replacement   Hyperlipidemia Lab Results  Component Value Date   LDLCALC 65 08/30/2024   Stable, pt to continue current statin crestor  40 mg qd   Essential hypertension BP Readings from Last 3 Encounters:  09/09/24 (!) 144/86  07/29/24 136/87  03/09/24 (!) 134/92   uncontrolled, pt to continue medical treatment lisinopril  40 mg every day, and add amlodipine  5 mg qd   Diabetes (HCC) Without insulin, with hyperglyemia  Lab Results  Component Value Date   HGBA1C 8.0 (H) 08/30/2024   Uncontrolled, pt to continue current medical treatment metformin  ER 500 mg -  4 every day, and start ozempic  0.25 mg weekly   B12 deficiency Lab Results  Component Value Date   VITAMINB12 356 08/30/2024   Stable, cont oral replacement - b12 1000 mcg qd  Followup: Return in about 6 months (around  03/09/2025).  Howard Rush, MD 09/09/2024 7:04 PM [facility]

## 2024-09-09 NOTE — Assessment & Plan Note (Signed)
 Lab Results  Component Value Date   LDLCALC 65 08/30/2024   Stable, pt to continue current statin crestor  40 mg qd

## 2024-09-09 NOTE — Patient Instructions (Signed)
 Please take OTC Vitamin D3 at 2000 units per day, indefinitely  Please take all new medication as prescribed - the ozempic , and call in 1 month if you are doing ok so we can go to the higher dose  Please take all new medication as prescribed- the amlodipine  5 mg per day for blood pressure  Please continue all other medications as before  Please have the pharmacy call with any other refills you may need.  Please continue your efforts at being more active, low cholesterol diet, and weight control.  Please keep your appointments with your specialists as you may have planned  Please make an Appointment to return in 6 months, or sooner if needed, also with Lab Appointment for testing done 3-5 days before at the FIRST FLOOR Lab (so this is for TWO appointments - please see the scheduling desk as you leave)

## 2024-09-09 NOTE — Assessment & Plan Note (Addendum)
 Without insulin, with hyperglyemia  Lab Results  Component Value Date   HGBA1C 8.0 (H) 08/30/2024   Uncontrolled, pt to continue current medical treatment metformin  ER 500 mg - 4 every day, and start ozempic  0.25 mg weekly
